# Patient Record
Sex: Male | Born: 1935 | Race: White | Hispanic: No | Marital: Married | Smoking: Never smoker
Health system: Southern US, Community
[De-identification: ages and names within clinical notes are randomized; demographics above are authoritative.]

## PROBLEM LIST (undated history)

## (undated) DIAGNOSIS — F0390 Unspecified dementia without behavioral disturbance: Secondary | ICD-10-CM

## (undated) DIAGNOSIS — M19171 Post-traumatic osteoarthritis, right ankle and foot: Secondary | ICD-10-CM

## (undated) DIAGNOSIS — F329 Major depressive disorder, single episode, unspecified: Secondary | ICD-10-CM

## (undated) DIAGNOSIS — E785 Hyperlipidemia, unspecified: Secondary | ICD-10-CM

## (undated) DIAGNOSIS — G40219 Localization-related (focal) (partial) symptomatic epilepsy and epileptic syndromes with complex partial seizures, intractable, without status epilepticus: Secondary | ICD-10-CM

## (undated) DIAGNOSIS — E039 Hypothyroidism, unspecified: Secondary | ICD-10-CM

## (undated) DIAGNOSIS — K2270 Barrett's esophagus without dysplasia: Secondary | ICD-10-CM

## (undated) DIAGNOSIS — R42 Dizziness and giddiness: Secondary | ICD-10-CM

## (undated) DIAGNOSIS — R413 Other amnesia: Secondary | ICD-10-CM

## (undated) DIAGNOSIS — J449 Chronic obstructive pulmonary disease, unspecified: Secondary | ICD-10-CM

## (undated) DIAGNOSIS — J309 Allergic rhinitis, unspecified: Secondary | ICD-10-CM

## (undated) DIAGNOSIS — G40909 Epilepsy, unspecified, not intractable, without status epilepticus: Secondary | ICD-10-CM

## (undated) DIAGNOSIS — N400 Enlarged prostate without lower urinary tract symptoms: Secondary | ICD-10-CM

## (undated) DIAGNOSIS — K219 Gastro-esophageal reflux disease without esophagitis: Secondary | ICD-10-CM

## (undated) DIAGNOSIS — I471 Supraventricular tachycardia: Secondary | ICD-10-CM

## (undated) DIAGNOSIS — R569 Unspecified convulsions: Secondary | ICD-10-CM

## (undated) DIAGNOSIS — J45909 Unspecified asthma, uncomplicated: Secondary | ICD-10-CM

## (undated) DIAGNOSIS — J209 Acute bronchitis, unspecified: Secondary | ICD-10-CM

## (undated) DIAGNOSIS — M25571 Pain in right ankle and joints of right foot: Secondary | ICD-10-CM

## (undated) DIAGNOSIS — F3289 Other specified depressive episodes: Secondary | ICD-10-CM

## (undated) HISTORY — DX: Hypothyroidism, unspecified: E03.9

## (undated) HISTORY — DX: Pain in right ankle and joints of right foot: M25.571

## (undated) HISTORY — DX: Barrett's esophagus without dysplasia: K22.70

## (undated) HISTORY — DX: Allergic rhinitis, unspecified: J30.9

## (undated) HISTORY — DX: Supraventricular tachycardia: I47.1

## (undated) HISTORY — DX: Major depressive disorder, single episode, unspecified: F32.9

## (undated) HISTORY — DX: Unspecified convulsions: R56.9

## (undated) HISTORY — DX: Dizziness and giddiness: R42

## (undated) HISTORY — DX: Unspecified asthma, uncomplicated: J45.909

## (undated) HISTORY — DX: Other amnesia: R41.3

## (undated) HISTORY — DX: Localization-related (focal) (partial) symptomatic epilepsy and epileptic syndromes with complex partial seizures, intractable, without status epilepticus: G40.219

## (undated) HISTORY — DX: Unspecified dementia, unspecified severity, without behavioral disturbance, psychotic disturbance, mood disturbance, and anxiety: F03.90

## (undated) HISTORY — DX: Gastro-esophageal reflux disease without esophagitis: K21.9

## (undated) HISTORY — DX: Acute bronchitis, unspecified: J20.9

## (undated) HISTORY — DX: Hyperlipidemia, unspecified: E78.5

## (undated) HISTORY — DX: Epilepsy, unspecified, not intractable, without status epilepticus: G40.909

## (undated) HISTORY — DX: Benign prostatic hyperplasia without lower urinary tract symptoms: N40.0

## (undated) HISTORY — DX: Other specified depressive episodes: F32.89

## (undated) HISTORY — DX: Chronic obstructive pulmonary disease, unspecified: J44.9

## (undated) HISTORY — PX: APPENDECTOMY: SHX54

## (undated) HISTORY — DX: Post-traumatic osteoarthritis, right ankle and foot: M19.171

## (undated) NOTE — *Deleted (*Deleted)
We will continue donepezil, memantine and levetiracetam as prescribed.   Stay well hydrated. Well balanced diet and regular exercise encouraged.   Follow up in   Memory Compensation Strategies  1. Use "WARM" strategy.  W= write it down  A= associate it  R= repeat it  M= make a mental note  2.   You can keep a Glass blower/designer.  Use a 3-ring notebook with sections for the following: calendar, important names and phone numbers,  medications, doctors' names/phone numbers, lists/reminders, and a section to journal what you did  each day.   3.    Use a calendar to write appointments down.  4.    Write yourself a schedule for the day.  This can be placed on the calendar or in a separate section of the Memory Notebook.  Keeping a  regular schedule can help memory.  5.    Use medication organizer with sections for each day or morning/evening pills.  You may need help loading it  6.    Keep a basket, or pegboard by the door.  Place items that you need to take out with you in the basket or on the pegboard.  You may also want to  include a message board for reminders.  7.    Use sticky notes.  Place sticky notes with reminders in a place where the task is performed.  For example: " turn off the  stove" placed by the stove, "lock the door" placed on the door at eye level, " take your medications" on  the bathroom mirror or by the place where you normally take your medications.  8.    Use alarms/timers.  Use while cooking to remind yourself to check on food or as a reminder to take your medicine, or as a  reminder to make a call, or as a reminder to perform another task, etc.   Seizure, Adult A seizure is a sudden burst of abnormal electrical activity in the brain. Seizures usually last from 30 seconds to 2 minutes. They can cause many different symptoms. Usually, seizures are not harmful unless they last a long time. What are the causes? Common causes of this condition include:  Fever  or infection.  Conditions that affect the brain, such as: ? A brain abnormality that you were born with. ? A brain or head injury. ? Bleeding in the brain. ? A tumor. ? Stroke. ? Brain disorders such as autism or cerebral palsy.  Low blood sugar.  Conditions that are passed from parent to child (are inherited).  Problems with substances, such as: ? Having a reaction to a drug or a medicine. ? Suddenly stopping the use of a substance (withdrawal). In some cases, the cause may not be known. A person who has repeated seizures over time without a clear cause has a condition called epilepsy. What increases the risk? You are more likely to get this condition if you have:  A family history of epilepsy.  Had a seizure in the past.  A brain disorder.  A history of head injury, lack of oxygen at birth, or strokes. What are the signs or symptoms? There are many types of seizures. The symptoms vary depending on the type of seizure you have. Examples of symptoms during a seizure include:  Shaking (convulsions).  Stiffness in the body.  Passing out (losing consciousness).  Head nodding.  Staring.  Not responding to sound or touch.  Loss of bladder control and bowel control. Some people  have symptoms right before and right after a seizure happens. Symptoms before a seizure may include:  Fear.  Worry (anxiety).  Feeling like you may vomit (nauseous).  Feeling like the room is spinning (vertigo).  Feeling like you saw or heard something before (dj vu).  Odd tastes or smells.  Changes in how you see. You may see flashing lights or spots. Symptoms after a seizure happens can include:  Confusion.  Sleepiness.  Headache.  Weakness on one side of the body. How is this treated? Most seizures will stop on their own in under 5 minutes. In these cases, no treatment is needed. Seizures that last longer than 5 minutes will usually need treatment. Treatment can include:   Medicines given through an IV tube.  Avoiding things that are known to cause your seizures. These can include medicines that you take for another condition.  Medicines to treat epilepsy.  Surgery to stop the seizures. This may be needed if medicines do not help. Follow these instructions at home: Medicines  Take over-the-counter and prescription medicines only as told by your doctor.  Do not eat or drink anything that may keep your medicine from working, such as alcohol. Activity  Do not do any activities that would be dangerous if you had another seizure, like driving or swimming. Wait until your doctor says it is safe for you to do them.  If you live in the [place] ask your local DMV (department of motor vehicles) when you can drive.  Get plenty of rest. Teaching others Teach friends and family what to do when you have a seizure. They should:  Lay you on the ground.  Protect your head and body.  Loosen any tight clothing around your neck.  Turn you on your side.  Not hold you down.  Not put anything into your mouth.  Know whether or not you need emergency care.  Stay with you until you are better.  General instructions  Contact your doctor each time you have a seizure.  Avoid anything that gives you seizures.  Keep a seizure diary. Write down: ? What you think caused each seizure. ? What you remember about each seizure.  Keep all follow-up visits as told by your doctor. This is important. Contact a doctor if:  You have another seizure.  You have seizures more often.  There is any change in what happens during your seizures.  You keep having seizures with treatment.  You have symptoms of being sick or having an infection. Get help right away if:  You have a seizure that: ? Lasts longer than 5 minutes. ? Is different than seizures you had before. ? Makes it harder to breathe. ? Happens after you hurt your head.  You have any of these symptoms after a  seizure: ? Not being able to speak. ? Not being able to use a part of your body. ? Confusion. ? A bad headache.  You have two or more seizures in a row.  You do not wake up right after a seizure.  You get hurt during a seizure. These symptoms may be an emergency. Do not wait to see if the symptoms will go away. Get medical help right away. Call your local emergency services (911 in the [place] Do not drive yourself to the hospital. Summary  Seizures usually last from 30 seconds to 2 minutes. Usually, they are not harmful unless they last a long time.  Do not eat or drink anything that may keep  your medicine from working, such as alcohol.  Teach friends and family what to do when you have a seizure.  Contact your doctor each time you have a seizure. This information is not intended to replace advice given to you by your health care provider. Make sure you discuss any questions you have with your health care provider. Document Revised: 12/25/2018 Document Reviewed: 12/25/2018 Elsevier Patient Education  2020 [employer].

---

## 2002-06-10 ENCOUNTER — Ambulatory Visit (HOSPITAL_COMMUNITY): Admission: RE | Admit: 2002-06-10 | Discharge: 2002-06-10 | Payer: Self-pay | Admitting: *Deleted

## 2002-06-10 ENCOUNTER — Encounter (INDEPENDENT_AMBULATORY_CARE_PROVIDER_SITE_OTHER): Payer: Self-pay | Admitting: Specialist

## 2002-09-27 ENCOUNTER — Ambulatory Visit (HOSPITAL_COMMUNITY): Admission: RE | Admit: 2002-09-27 | Discharge: 2002-09-27 | Payer: Self-pay | Admitting: *Deleted

## 2002-09-27 ENCOUNTER — Encounter (INDEPENDENT_AMBULATORY_CARE_PROVIDER_SITE_OTHER): Payer: Self-pay

## 2003-04-19 ENCOUNTER — Ambulatory Visit (HOSPITAL_COMMUNITY): Admission: RE | Admit: 2003-04-19 | Discharge: 2003-04-19 | Payer: Self-pay | Admitting: *Deleted

## 2003-04-19 ENCOUNTER — Encounter (INDEPENDENT_AMBULATORY_CARE_PROVIDER_SITE_OTHER): Payer: Self-pay | Admitting: *Deleted

## 2003-10-22 HISTORY — PX: OTHER SURGICAL HISTORY: SHX169

## 2004-05-28 ENCOUNTER — Ambulatory Visit (HOSPITAL_COMMUNITY): Admission: RE | Admit: 2004-05-28 | Discharge: 2004-05-28 | Payer: Self-pay | Admitting: *Deleted

## 2004-05-28 ENCOUNTER — Encounter (INDEPENDENT_AMBULATORY_CARE_PROVIDER_SITE_OTHER): Payer: Self-pay | Admitting: *Deleted

## 2004-11-23 ENCOUNTER — Ambulatory Visit: Payer: Self-pay | Admitting: Internal Medicine

## 2004-12-11 ENCOUNTER — Ambulatory Visit (HOSPITAL_COMMUNITY): Admission: RE | Admit: 2004-12-11 | Discharge: 2004-12-11 | Payer: Self-pay | Admitting: *Deleted

## 2004-12-11 ENCOUNTER — Encounter (INDEPENDENT_AMBULATORY_CARE_PROVIDER_SITE_OTHER): Payer: Self-pay | Admitting: *Deleted

## 2005-04-10 ENCOUNTER — Ambulatory Visit: Payer: Self-pay | Admitting: Internal Medicine

## 2005-05-16 ENCOUNTER — Ambulatory Visit: Payer: Self-pay | Admitting: Internal Medicine

## 2005-06-05 ENCOUNTER — Encounter: Admission: RE | Admit: 2005-06-05 | Discharge: 2005-06-05 | Payer: Self-pay | Admitting: Internal Medicine

## 2005-10-16 ENCOUNTER — Ambulatory Visit: Payer: Self-pay | Admitting: Internal Medicine

## 2005-12-23 ENCOUNTER — Encounter (INDEPENDENT_AMBULATORY_CARE_PROVIDER_SITE_OTHER): Payer: Self-pay | Admitting: *Deleted

## 2005-12-23 ENCOUNTER — Ambulatory Visit (HOSPITAL_COMMUNITY): Admission: RE | Admit: 2005-12-23 | Discharge: 2005-12-23 | Payer: Self-pay | Admitting: *Deleted

## 2006-04-24 ENCOUNTER — Encounter: Admission: RE | Admit: 2006-04-24 | Discharge: 2006-04-24 | Payer: Self-pay | Admitting: Orthopedic Surgery

## 2006-06-24 ENCOUNTER — Encounter (INDEPENDENT_AMBULATORY_CARE_PROVIDER_SITE_OTHER): Payer: Self-pay | Admitting: *Deleted

## 2006-06-24 ENCOUNTER — Inpatient Hospital Stay (HOSPITAL_COMMUNITY): Admission: RE | Admit: 2006-06-24 | Discharge: 2006-06-27 | Payer: Self-pay | Admitting: Orthopedic Surgery

## 2006-08-26 ENCOUNTER — Ambulatory Visit: Payer: Self-pay | Admitting: Internal Medicine

## 2006-12-22 ENCOUNTER — Ambulatory Visit: Payer: Self-pay | Admitting: Internal Medicine

## 2007-02-26 ENCOUNTER — Encounter (INDEPENDENT_AMBULATORY_CARE_PROVIDER_SITE_OTHER): Payer: Self-pay | Admitting: Specialist

## 2007-02-26 ENCOUNTER — Ambulatory Visit (HOSPITAL_COMMUNITY): Admission: RE | Admit: 2007-02-26 | Discharge: 2007-02-26 | Payer: Self-pay | Admitting: *Deleted

## 2007-06-30 ENCOUNTER — Ambulatory Visit: Payer: Self-pay | Admitting: Psychology

## 2007-06-30 ENCOUNTER — Encounter: Admission: RE | Admit: 2007-06-30 | Discharge: 2007-09-28 | Payer: Self-pay | Admitting: Neurology

## 2007-09-26 DIAGNOSIS — K219 Gastro-esophageal reflux disease without esophagitis: Secondary | ICD-10-CM | POA: Insufficient documentation

## 2007-09-26 DIAGNOSIS — J309 Allergic rhinitis, unspecified: Secondary | ICD-10-CM

## 2007-09-26 DIAGNOSIS — R569 Unspecified convulsions: Secondary | ICD-10-CM

## 2007-09-26 DIAGNOSIS — J45909 Unspecified asthma, uncomplicated: Secondary | ICD-10-CM

## 2007-09-26 HISTORY — DX: Allergic rhinitis, unspecified: J30.9

## 2007-09-26 HISTORY — DX: Unspecified convulsions: R56.9

## 2007-09-26 HISTORY — DX: Unspecified asthma, uncomplicated: J45.909

## 2008-03-08 ENCOUNTER — Telehealth (INDEPENDENT_AMBULATORY_CARE_PROVIDER_SITE_OTHER): Payer: Self-pay | Admitting: *Deleted

## 2008-03-11 ENCOUNTER — Ambulatory Visit: Payer: Self-pay | Admitting: Internal Medicine

## 2008-05-17 ENCOUNTER — Ambulatory Visit (HOSPITAL_COMMUNITY): Admission: RE | Admit: 2008-05-17 | Discharge: 2008-05-17 | Payer: Self-pay | Admitting: *Deleted

## 2008-05-17 ENCOUNTER — Encounter (INDEPENDENT_AMBULATORY_CARE_PROVIDER_SITE_OTHER): Payer: Self-pay | Admitting: *Deleted

## 2008-07-11 ENCOUNTER — Telehealth (INDEPENDENT_AMBULATORY_CARE_PROVIDER_SITE_OTHER): Payer: Self-pay | Admitting: *Deleted

## 2008-12-12 ENCOUNTER — Encounter (INDEPENDENT_AMBULATORY_CARE_PROVIDER_SITE_OTHER): Payer: Self-pay | Admitting: *Deleted

## 2008-12-12 ENCOUNTER — Ambulatory Visit (HOSPITAL_COMMUNITY): Admission: RE | Admit: 2008-12-12 | Discharge: 2008-12-12 | Payer: Self-pay | Admitting: *Deleted

## 2009-05-12 ENCOUNTER — Telehealth: Payer: Self-pay | Admitting: Internal Medicine

## 2009-09-05 ENCOUNTER — Ambulatory Visit: Payer: Self-pay | Admitting: Internal Medicine

## 2009-09-05 DIAGNOSIS — J209 Acute bronchitis, unspecified: Secondary | ICD-10-CM

## 2009-09-05 HISTORY — DX: Acute bronchitis, unspecified: J20.9

## 2009-09-06 ENCOUNTER — Telehealth: Payer: Self-pay | Admitting: Internal Medicine

## 2010-01-09 ENCOUNTER — Encounter: Admission: RE | Admit: 2010-01-09 | Discharge: 2010-01-09 | Payer: Self-pay | Admitting: Neurology

## 2010-06-04 ENCOUNTER — Emergency Department (HOSPITAL_COMMUNITY): Admission: EM | Admit: 2010-06-04 | Discharge: 2010-06-04 | Payer: Self-pay | Admitting: Emergency Medicine

## 2010-10-08 ENCOUNTER — Ambulatory Visit: Payer: Self-pay | Admitting: Internal Medicine

## 2010-11-22 NOTE — Assessment & Plan Note (Signed)
Summary: 1 YR ///KP   Visit Type:  Follow-up Primary Provider/Referring Provider:  Eloise Harman  CC:  1 year follow up. pt has no new concerns. .  History of Present Illness:  History of Present Illness: 03/26/2008- Never smoker returning for f/u of allergic rhinitis and bronchitis. Occasional spring pollen rhinitis. Breathing does not wake him.reflux comes.  He takes Nexium, but is changing to omeprazole b.i.d. under supervision of Dr. Virginia Rochester. he did quit allergy vaccine 3 or 4 years ago.  We reviewed his medications.  September 05, 2009- Allergic rhinitis, bronchitis, GERD, hx seizure..........................Marland Kitchenwife here Coughing up dark sputum 3-4 days, esp lying down. Denies headache, fever or sore throat. No GI. Taking Robitusin. Did get flu vax. Has lost from 165 a year ago- trying to eat healthier.  October 08, 2010- Allergic rhinitis, bronchitis, GERD, hx seizure.. Nurse-CC: 1 year follow up. pt has no new concerns.  Got flu shot. Feels he is doing well. Likes Advair which works well for him.  CXR last year WNL, NAD, incidental bone shadow..     Asthma History    Initial Asthma Severity Rating:    Age range: 12+ years    Symptoms: 0-2 days/week    Nighttime Awakenings: 0-2/month    Interferes w/ normal activity: no limitations    SABA use (not for EIB): 0-2 days/week    Asthma Severity Assessment: Intermittent   Preventive Screening-Counseling & Management  Alcohol-Tobacco     Smoking Status: never  Current Medications (verified): 1)  Advair Diskus 250-50 Mcg/dose Misc (Fluticasone-Salmeterol) .... One Puff Every 12 Hours, Rinse Mouth 2)  Keppra 500 Mg  Tabs (Levetiracetam) .... Take 2 By Mouth Once Daily 3)  Vitamin B-6 500 Mg  Tabs (Pyridoxine Hcl) 4)  Synthroid 125 Mcg Tabs (Levothyroxine Sodium) .... Take 1 By Mouth Once Daily 5)  Proair Hfa 108 (90 Base) Mcg/act  Aers (Albuterol Sulfate) .... 2 Puffs Four Times A Day As Needed - Must Make Appt With Dr. Maple Hudson For Further  Refills 6)  Omeprazole 20 Mg Cpdr (Omeprazole) .... Take 1 By Mouth Once Daily 7)  Avodart 0.5 Mg Caps (Dutasteride) .... Take 1 By Mouth Once Daily  Allergies (verified): 1)  ! Sulfa  Past History:  Past Medical History: Last updated: March 26, 2008  SEIZURE DISORDER (ICD-780.39) ESOPHAGEAL REFLUX (ICD-530.81) ALLERGIC RHINITIS (ICD-477.9) ASTHMA (ICD-493.90)  Past Surgical History: Last updated: 09/05/2009 Appendectomy Accident- hit in chest with rib fxs and fx left arm, knocked out teeth while in [employer] Right hip replacement  Family History: Last updated: 2008/03/26 Mother- deceased age 59; heart. Father- deceased age 80; heart. Sister- living age 24; asthma.  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   Risk Factors: Smoking Status: never (10/08/2010)  Review of Systems      See HPI  The patient denies shortness of breath with activity, shortness of breath at rest, productive cough, non-productive cough, coughing up blood, chest pain, irregular heartbeats, acid heartburn, indigestion, loss of appetite, weight change, abdominal pain, difficulty swallowing, sore throat, tooth/dental problems, headaches, nasal congestion/difficulty breathing through nose, sneezing, itching, ear ache, rash, change in color of mucus, and fever.    Vital Signs:  Patient profile:   77 year old male Height:      68.5 inches Weight:      158 pounds BMI:     23.76 O2 Sat:      96 % on Room air Pulse rate:   65 / minute BP  sitting:   104 / 60  (left arm) Cuff size:   regular  Vitals Entered By: Carver Fila (October 08, 2010 2:30 PM)  O2 Flow:  Room air CC: 1 year follow up. pt has no new concerns.  Comments meds and allergies updated Phone number updated Carver Fila  October 08, 2010 2:31 PM    Physical Exam  Additional Exam:  General: A/Ox3; pleasant and cooperative, NAD, slim, looks verycomfortable NODES: no  lymphadenopathy HEENT: Horace/AT, EOM- WNL, Conjuctivae- clear, PERRLA, TM-WNL, Nose- clear, Throat- clear and wnl, Mallampatii II NECK: Supple w/ fair ROM, JVD- none, normal carotid impulses w/o bruits Thyroid-  CHEST: very clear and unlabored HEART: RRR, no m/g/r heard ABDOMEN: trim JYN:WGNF, nl pulses, no edema  NEURO: Grossly intact to observation      CXR  Procedure date:  08/30/2009  Findings:      DG CHEST 2 VIEW - [id number]   Clinical Data: Nonsmoker with asthma and cough and congestion.   CHEST - 2 VIEW   Comparison: 06/18/2006.   Findings: The heart size and mediastinal contours are stable.  The lungs are clear and do not appear significantly hyperinflated.  The previously described added density medially at the right apex is unchanged and appears to be related to the right sternoclavicular junction.  No acute osseous findings are demonstrated.   IMPRESSION: Stable examination.  No active cardiopulmonary process.   Read By:  Gerrianne Scale,  M.D.     Released By:  Gerrianne Scale,  M.D.  ___________  Impression & Recommendations:  Problem # 1:  ASTHMA (ICD-493.90) Good control.  He is pleased with Advair and will continue that. We discussed use of rescue inhaler- needed only very rarely.  Problem # 2:  ALLERGIC RHINITIS (ICD-477.9) He is managing with seasonal antihistamines now.  Problem # 3:  ESOPHAGEAL REFLUX (ICD-530.81) He has a hx of seizure disorder and of GERD/ reflux, which increase the risk of aspiration and worsening of asthma. We discussed this as a potential issue. His updated medication list for this problem includes:    Omeprazole 20 Mg Cpdr (Omeprazole) .Marland Kitchen... Take 1 by mouth once daily  Other Orders: Est. Patient Level IV (78469)  Patient Instructions: 1)  Please schedule a follow-up appointment in 1 year. 2)  Refill scripts for meds.  Prescriptions: PROAIR HFA 108 (90 BASE) MCG/ACT  AERS (ALBUTEROL SULFATE) 2 puffs four times a  day as needed - MUST make appt with Dr. Maple Hudson for further refills  #1 x prn   Entered and Authorized by:   Waymon Budge MD   Signed by:   Waymon Budge MD on 10/08/2010   Method used:   Print then Give to Patient   RxID:   [id number] ADVAIR DISKUS 250-50 MCG/DOSE MISC (FLUTICASONE-SALMETEROL) One puff every 12 hours, rinse mouth  #1 x prn   Entered and Authorized by:   Waymon Budge MD   Signed by:   Waymon Budge MD on 10/08/2010   Method used:   Print then Give to Patient   RxID:   [id number]    Immunization History:  Influenza Immunization History:    Influenza:  historical (06/21/2010)    CXR  Procedure date:  08/30/2009  Findings:      DG CHEST 2 VIEW - [id number]   Clinical Data: Nonsmoker with asthma and cough and congestion.   CHEST - 2 VIEW   Comparison: 06/18/2006.   Findings: The heart size and mediastinal contours  are stable.  The lungs are clear and do not appear significantly hyperinflated.  The previously described added density medially at the right apex is unchanged and appears to be related to the right sternoclavicular junction.  No acute osseous findings are demonstrated.   IMPRESSION: Stable examination.  No active cardiopulmonary process.   Read By:  Gerrianne Scale,  M.D.     Released By:  Gerrianne Scale,  M.D.  ___________

## 2011-03-05 NOTE — Op Note (Signed)
NAME:  [name], [name] NO.:  [id number]   MEDICAL RECORD NO.:  [id number]          PATIENT TYPE:  AMB   LOCATION:  ENDO                         FACILITY:  [facility]   PHYSICIAN:  Georgiana Spinner, M.D.    DATE OF BIRTH:  1934-01-24   DATE OF PROCEDURE:  DATE OF DISCHARGE:                               OPERATIVE REPORT   PROCEDURE:  Upper endoscopy.   INDICATIONS:  Barrett's esophagus.   ANESTHESIA:  Fentanyl 50 mcg, Versed 5 mg.   PROCEDURE IN DETAIL:  With the patient mildly sedated in the left  lateral decubitus position, the Pentax videoscopic endoscope was  inserted in the mouth and passed under direct vision through the  esophagus, which appeared normal, until we reached the distal esophagus  and extensive Barrett's esophagus once again seen, photographed and  multiple biopsies taken in all 4 quadrants.  We then entered into the  stomach, fundus, body, antrum, duodenal bulb, second portion of the  duodenum were visualized.  From this point the endoscope was slowly  withdrawn, taking circumferential views of duodenal mucosa until the  endoscope had been pulled back and the stomach placed in retroflexion to  view the stomach from below. The endoscope was straightened and  withdrawn, taking circumferential views of remaining gastric and  esophageal mucosa.  The patient's vital signs, pulse oximeter remained  stable.  The patient tolerated procedure well, without apparent  complication.   FINDINGS:  Extensive Barrett's esophagus, loose wrap of the  gastroesophageal junction around the endoscope indicating laxity of this  area.   PLAN:  Await biopsy report.  The patient will call me for results and  followup with me as needed as an outpatient.           ______________________________  Georgiana Spinner, M.D.     GMO/MEDQ  D:  05/17/2008  T:  05/17/2008  Job:  [id number]

## 2011-03-05 NOTE — Op Note (Signed)
NAME:  [name], MCMAHILL NO.:  [id number]   MEDICAL RECORD NO.:  [id number]          PATIENT TYPE:  AMB   LOCATION:  ENDO                         FACILITY:  [facility]   PHYSICIAN:  Georgiana Spinner, M.D.    DATE OF BIRTH:  07/05/1934   DATE OF PROCEDURE:  DATE OF DISCHARGE:                               OPERATIVE REPORT   PROCEDURE:  Upper endoscopy with biopsy.   INDICATIONS:  GERD with Barrett esophagus.   ANESTHESIA:  Fentanyl 50 mcg, Versed 4 mg.   PROCEDURE:  With the patient mildly sedated in the left lateral  decubitus position, the Pentax videoscopic endoscope was inserted in the  mouth, passed under direct vision through the esophagus, which appeared  normal, until we reached the distal esophagus, and there were clear-cut  changes of Barrett esophagus, photographed and biopsied.  We entered  into the stomach.  Fundus, body, antrum, duodenal bulb, second portion  of the duodenum were visualized.  From this point, the endoscope was  slowly withdrawn, taking circumferential views of the duodenal mucosa  until the endoscope had been pulled back into stomach, placed in  retroflexion to view the stomach from below.  The endoscope was  straightened and withdrawn, taking circumferential views of the  remaining gastric and esophageal mucosa.  The patient's vital signs and  pulse oximeter remained stable.  The patient tolerated the procedure  well without apparent complications.   FINDINGS:  Loose wrap of the GE junction around the endoscope indicating  laxity of the lower esophageal sphincter and Barrett esophagus.  Await  biopsy report.  The patient will call me for results and follow up with  me as an outpatient.           ______________________________  Georgiana Spinner, M.D.     GMO/MEDQ  D:  12/12/2008  T:  12/12/2008  Job:  [id number]

## 2011-03-08 NOTE — Op Note (Signed)
   TNAMERAM, HAUGAN NO.:  [id number]   MEDICAL RECORD NO.:  [id number]                   PATIENT TYPE:  AMB   LOCATION:  ENDO                                 FACILITY:  [facility]   PHYSICIAN:  Georgiana Spinner, M.D.                 DATE OF BIRTH:  05-18-1934   DATE OF PROCEDURE:  DATE OF DISCHARGE:                                 OPERATIVE REPORT   PROCEDURE:  Upper endoscopy.   INDICATIONS FOR PROCEDURE:  Gastroesophageal reflux disease.   ANESTHESIA:  Demerol 50, Versed 5 mg.   DESCRIPTION OF PROCEDURE:  With the patient mildly sedated in the left  lateral decubitus position, the Olympus videoscopic endoscope was inserted  in the mouth and passed under direct vision through the esophagus which was  normal until about mid esophagus and extensive changes of Barrett's  esophagus were seen, photographed and biopsied. There was a hiatal hernia.  The fundus, body, antrum, duodenal bulb and second portion of the duodenum  and all appeared normal, representative photographs taken. From this point,  the endoscope was slowly withdrawn taking circumferential views of the  duodenal mucosa until the endoscope was then pulled back in the stomach,  placed in retroflexion to view the stomach from below and once again a  hiatal hernia was visualized. The endoscope was then straightened and  withdrawn taking circumferential views of the remaining gastric and  esophageal mucosa. The patient's vital signs and pulse oximeter remained  stable. The patient tolerated the procedure well without apparent  complications.   FINDINGS:  Diffuse Barrett's esophagus with a hiatal hernia.   PLAN:  Await biopsy reports. The patient will call me for results and  followup with me as an outpatient. Proceed to colonoscopy as planned.                                                Georgiana Spinner, M.D.    GMO/MEDQ  D:  06/10/2002  T:  06/11/2002  Job:  [id number]

## 2011-03-08 NOTE — Assessment & Plan Note (Signed)
[facility]                             PULMONARY OFFICE NOTE   NAME:[name], [name]                   MRN:          [id number]  DATE:12/22/2006                            DOB:          Jul 24, 1934    HISTORY OF PRESENT ILLNESS:  The patient is a patient of Dr. Roxy Cedar who  has a known history of asthma, allergic rhinitis who presents for a one  week history of nasal congestion, productive cough with thick yellowish  sputum, fevers, chills and hoarseness. The patient's wife has had  similar symptoms at home. The patient denies any hemoptysis or  orthopnea, PND or leg swelling.   PAST MEDICAL HISTORY:  Reviewed.   CURRENT MEDICATIONS:  Reviewed.   PHYSICAL EXAMINATION:  The patient is pleasant male in no acute  distress. He is afebrile with stable vital signs. O2 saturation is 94%  on room air.  HEENT: Nasal mucosa with some mild erythema. Nontender sinuses.  NECK: Supple without adenopathy. No JVD.  LUNGS: Lung sounds reveal coarse breath sounds without any wheezing or  crackles.  CARDIAC: Regular rate.  ABDOMEN: Soft and nontender.  EXTREMITIES: Warm without edema.   IMPRESSION/PLAN:  Acute upper respiratory infection with mild asthmatic  flare. The patient is to begin Omnicef x 7 days, Mucinex DM twice daily  and prednisone taper over the next week. The patient was given a Xopenex  nebulizer treatment in the office. The patient will followup with Dr.  Maple Hudson as scheduled or sooner if needed.      Rubye Oaks, NP  Electronically Signed      Clinton D. Maple Hudson, MD, Tonny Bollman, FACP  Electronically Signed   TP/MedQ  DD: 12/22/2006  DT: 12/22/2006  Job #: [id number]

## 2011-03-08 NOTE — Op Note (Signed)
NAME:  [name], [name] NO.:  [id number]   MEDICAL RECORD NO.:  [id number]          PATIENT TYPE:  AMB   LOCATION:  ENDO                         FACILITY:  [facility]   PHYSICIAN:  Georgiana Spinner, M.D.    DATE OF BIRTH:  1934-03-25   DATE OF PROCEDURE:  02/26/2007  DATE OF DISCHARGE:                               OPERATIVE REPORT   PROCEDURE:  Upper endoscopy with biopsy.   INDICATIONS:  Barrett esophagus.   ANESTHESIA:  Fentanyl 100 mcg, Versed 10 mg.   PROCEDURE:  With patient mildly sedated in the left lateral decubitus  position, the Pentax videoscopic endoscope was inserted in the mouth,  passed under direct vision through the esophagus, which appeared normal  until we reached the distal esophagus and extensive Barrett esophagus  was photographed and multiple biopsies taken.  We entered into the  stomach.  The fundus, body, antrum, duodenal bulb, second portion of the  duodenum appeared normal.  From this point, the endoscope was slowly  withdrawn taking circumferential views of the duodenal mucosa until the  endoscope had been pulled back into the stomach, placed in retroflexion  and viewed the stomach from below.  The endoscope was straightened and  withdrawn, taking circumferential views of the remaining gastric and  esophageal mucosa.  The patient's vital signs and pulse oximeter  remained stable.  The patient tolerated the procedure well without  apparent complications.   FINDINGS:  Extensive Barrett esophagus, biopsied.  Await biopsy report.  The patient will call me for results and follow up with me as an  outpatient.           ______________________________  Georgiana Spinner, M.D.     GMO/MEDQ  D:  02/26/2007  T:  02/26/2007  Job:  [id number]

## 2011-03-08 NOTE — Discharge Summary (Signed)
NAMEALLEN, Stephen NO.:  [id number]   MEDICAL RECORD NO.:  [id number]          PATIENT TYPE:  INP   LOCATION:  1512                         FACILITY:  [facility]   PHYSICIAN:  Madlyn Frankel. Charlann Boxer, M.D.  DATE OF BIRTH:  Jul 20, 1934   DATE OF ADMISSION:  06/24/2006  DATE OF DISCHARGE:  06/27/2006                                 DISCHARGE SUMMARY   PREOPERATIVE DIAGNOSIS:  Right hip advanced degenerative joint disease.   SECONDARY DIAGNOSES:  1. History of seizure disorder.  2. Asthma.  3. Hypothyroidism.  4. Hypercholesterolemia.  5. Acid reflux disease.   DISCHARGE MEDICATIONS:  Discharge medications include that seen on the home  med reconciliation form including Advair, Lamictal, Zetia, finasteride,  Nexium, albuterol, lovastatin, Synthroid, multivitamin, chondroitin sulfate,  and glucosamine and Lamictal. He will also be discharged on Colace as needed  p.o. b.i.d. for constipation on pain medication. He will be discharged on  Lovenox for 10 days to be stopped on July 07, 2006 and transitioned to  1 aspirin 325 mg daily for 4 weeks. He will be discharged on Vicodin for  pain and Robaxin for pain. He will also be discharged on an iron supplement.   HISTORY OF PRESENT ILLNESS:  Mr. [name] is a very pleasant 77 year old  gentleman with right hip pain. He was initially seen in June of this year  with right hip arthritis. He had progressive symptoms and requested discuss  hip replacement surgery. This was put on __________.   He was admitted for surgery on June 24, 2006 and underwent a successful  right total hip replacement without complications. His hospital course was  uncomplicated, he had no complications regarding his seizure activity or  asthma. His hematocrit did stabilize at 8 on iron and did not require  transfusion. This was despite having a pretty reactive synovitis in his hip  intraoperatively. He progressed with physical therapy and at the time  of  discharge was up moving around still having difficulty getting in and out of  bed. He had normal bowel function and was eating a normal diet at the time  of discharge.   DISCHARGE INSTRUCTIONS:  He will be partial weight bearing, 50%  weightbearing to the right lower extremity with the use of a walker, hip  precautions should be minded in terms of hip flexion greater than 90  degrees, internal rotation and pivoting. I will see him back in the office  in 2 weeks post discharge. He may shower and get his wound wet with Steri-  Strips over the wound. A dressing should be applied as needed.      Madlyn Frankel Charlann Boxer, M.D.  Electronically Signed    MDO/MEDQ  D:  06/27/2006  T:  06/27/2006  Job:  [id number]

## 2011-03-08 NOTE — Op Note (Signed)
   NAME:  [name], [name]                      ACCOUNT NO.:  [id number]   MEDICAL RECORD NO.:  [id number]                   PATIENT TYPE:  AMB   LOCATION:  ENDO                                 FACILITY:  [facility], [place]   PHYSICIAN:  Georgiana Spinner, M.D.                 DATE OF BIRTH:  October 14, 1934   DATE OF PROCEDURE:  09/27/2002  DATE OF DISCHARGE:                                 OPERATIVE REPORT   PROCEDURE:  Upper endoscopy with biopsy.   INDICATIONS:  GERD with Barrett's esophagus seen previously.   ANESTHESIA:  Demerol 50 mg, Versed 5 mg.   DESCRIPTION OF PROCEDURE:  With the patient mildly sedated in the left  lateral decubitus position, the Olympus videoscopic endoscope was inserted  into the mouth and passed under direct vision through the esophagus which  appeared normal until it reached the distal esophagus.  Fairly florid  Barrett's esophagus was seen and photographed in multiple views.  We entered  into the stomach and the fundus, body, antrum, duodenal bulb, and second  portion of the duodenum were visualized and all appeared normal.  From this  point, the endoscope was slowly withdrawn, taking circumferential views of  the duodenal mucosa until the endoscope was pulled into the stomach and  placed in retroflexion, viewing the stomach from below and an incomplete  wrap at the GE junction around the endoscope was well seen and the esophagus  could be easily seen on retroflex view in the cardia with photographs taken.  The endoscope was  then straightened and withdrawn, taking circumferential  views of the remaining gastric and esophageal mucosa, stopping at the distal  esophagus to sample all four quadrants and seeing Barrett's esophagus.  Once  accomplished, the endoscope was withdrawn.  The patient's vital signs and  pulse oximetry remained stable and the patient tolerated the procedure well  without apparent complications.   FINDINGS:  Incomplete wrap at gastroesophageal  junction around the  endoscope, indicating laxity of the sphincter associated with fairly florid  Barrett's esophagus.   PLAN:  Await biopsy report, the patient will call me for results and follow  up with me as an outpatient.                                               Georgiana Spinner, M.D.    GMO/MEDQ  D:  09/27/2002  T:  09/27/2002  Job:  [id number]

## 2011-03-08 NOTE — Op Note (Signed)
   NAME:  [name], [name]                      ACCOUNT NO.:  [id number]   MEDICAL RECORD NO.:  [id number]                   PATIENT TYPE:  AMB   LOCATION:  ENDO                                 FACILITY:  [facility]   PHYSICIAN:  Georgiana Spinner, M.D.                 DATE OF BIRTH:  October 20, 1934   DATE OF PROCEDURE:  04/19/2003  DATE OF DISCHARGE:                                 OPERATIVE REPORT   PROCEDURE:  Upper endoscopy with biopsy.   INDICATIONS:  Barrett's esophagus with low-grade dysplasia.   ANESTHESIA:  Demerol 30 mg, Versed 4 mg.   DESCRIPTION OF PROCEDURE:  With the patient mildly sedated in the left  lateral decubitus position, the Olympus videoscopic endoscope was inserted  into the mouth and passed under direct vision through the esophagus; which  appeared normal until we reached the distal esophagus, and clear areas of  Barrett's were seen.  It was circumferential; photographed and biopsied in  multiple areas with sampling taken every few centimeters.  Once  accomplished, we entered into the stomach.  The fundus, body, antrum,  duodenal bulb, second portion of the duodenum all appeared normal.  From  this point the endoscope was slowly withdrawn, taking circumferential views  of the entire duodenal mucosa, until the endoscope then pulled back and the  stomach placed in retroflexion to view the stomach from below.  A widely  patent GE junction was seen and photographed in retroflex view.  The  endoscope was straightened and withdrawn, taking circumferential views of  the remaining gastric and esophageal mucosa.  The patient's vital signs and  pulse oximetry remained stable.  The patient tolerated the procedure well  and all without apparent complications.   FINDINGS:  Hiatal hernia, above which was Barrett's esophagus, fairly  extensive.  This was biopsied.   PLAN:  Await biopsy report.  The patient will call me for results and follow  up with me as an outpatient.                                               Georgiana Spinner, M.D.    GMO/MEDQ  D:  04/19/2003  T:  04/19/2003  Job:  [id number]

## 2011-03-08 NOTE — Op Note (Signed)
NAME:  [name], [name] NO.:  [id number]   MEDICAL RECORD NO.:  [id number]          PATIENT TYPE:  INP   LOCATION:  0001                         FACILITY:  [facility]   PHYSICIAN:  Madlyn Frankel. Charlann Boxer, M.D.  DATE OF BIRTH:  [birth year]   DATE OF PROCEDURE:  06/24/2006  DATE OF DISCHARGE:                                 OPERATIVE REPORT   PREOPERATIVE DIAGNOSIS:  Advanced right hip osteoarthritis.   POSTOPERATIVE DIAGNOSES:  Advanced right hip osteoarthritis associated with  femoral neck fracture.   PROCEDURE:  Right total hip replacement.   COMPONENTS USED:  DePuy pinnacle 52 cup with two cancellous bone screws and  36 metal-on-metal neutral liner.  The femoral stem was a Trilock 13.8  lateralized offset stem with a 36, +5 ball.   SURGEON:  Madlyn Frankel. Charlann Boxer, M.D.   ASSISTANT:  Cherly Beach.   ANESTHESIA:  General.   BLOOD LOSS:  600 mL.   DRAINS:  None.   COMPLICATIONS:  None.   INDICATIONS:   INDICATIONS FOR PROCEDURE:  Mr. Salamone is a very pleasant 77 year old  gentleman who presented the office in June.  He had initially presented to  his primary care physician with right hip pain.  Radiographs that were  obtained in June,2007 at the time of presentation revealed that he had some  degenerative joint  disease.  I discussed with him treatment options and  included conservative care of use of a cane or walker, medications and  injections.  We did try cortisone injection to try to alleviate some of  symptoms.  This did very little to help his pain.  His pain continued to  press.  He was  resulted to be in a walker.  No new films have been ordered  based on the fact that he was already scheduled for surgery based on the  failure of conservative measures.   We discussed with him the surgical options of total hip replacement,  reviewing the risks and benefits of component failure, dislocation, need for  revision surgery, and persistent pain.  Consent was  obtained.   PROCEDURE IN DETAIL:  The patient was brought to operative theater.  Once  adequate anesthesia and preoperative antibiotics, 1 gram of Ancef, were  administered the patient was positioned in the left lateral decubitus  position with a right side up.  Right lower extremity was then prepped and  draped in sterile fashion following pre scrub.  Lateral based incision was  made for posterior approach of the hip.  The iliotibial band and gluteal  fascia was incised in line with incision for the posterior approach.  The  short external rotators taken down separate from the posterior capsule.  Capsulotomy was made and there was noted that the patient had a very large  hemarthrosis and it was evacuated.  Inside the joint there appeared to be of  significant synovitis.   This gave concern that perhaps it was either femoral head collapse or other  complication.  Upon attempted dislocation it was evident the femoral neck  was fractured.  This fracture was completed  with the dislocation, but no  other complications.  I used the corkscrew to remove the femoral head and  then made a neck osteotomy based off anatomic landmarks and our preoperative  templating base of the tip of the trochanter.   I did send off the femoral head and neck to pathology to make sure there was  no pathologic lesions even though the anatomy appeared to be normal at this  point.   This coincides with the preoperative finding when positioning noting  significant shortening to the right lower extremity.  I was able to distract  the femur with axial traction to restore the leg lengths.   At this point attention was directed to the femur.  Femoral preparation was  carried out per protocol with initial canal finder then single hand reaming  and then broaching I did irrigate the canal to prevent fat emboli.  I then  broached all way up to initially 12.5 broach and then went to the  acetabulum.  I identified that I had  about 20 degrees of anteversion in the  femur.   At this point, acetabular preparation was carried out through a synovectomy.  There was noted to be a pretty significant synovitis that was oozing.  Hemostasis was carried out as could be.  Retractors were placed.  There was  no significant labrum that needed to be debrided, but the rim was cleaned  up.  I started reaming with a 43 and carried it all the way up to a 51  reamer which had a good bony coverage.  At this point I impacted a 52  pinnacle cup.  Please note that I did initially impact this cup and placed  this screw and then performed a trial reduction with a 12.5 stem.  I was not  happy with my combined anteversion with this initial trial reduction so I  moved the trial components including the final femoral acetabular component  and repositioned it.  The final position the acetabular cup was beneath the  anterior wall anteriorly with about 25 degrees of forward flexion.  It  appeared to be much more anatomic upon the completion of this redo.  Single  cancellous screws were placed in two orientations to provide stability to  the cup, to add to the initial scratch fit.   A trial liner was placed.  At this point attention was redirected back to  the femur.  It was noted there was some torsional mobility with a 12.5 stem,  so I went up to a 13.8.  This was impacted to the level of the neck cut or a  little bit proud of this.   Initially I had trialed with a +5 ball and did this again.  The hip was much  more stable with the combined anteversion of 45 degrees.  The hip was stable  to 70-80 degrees internal rotation with neutral abduction and flexion of 90.  Stable in the sleep position with 4 degrees of adduction and internal  rotation.  No evidence of impingement with external rotation and extension.  Leg lengths at this point appeared to be equal with good muscle tension with about a mm of shuck.   At this point final components  were brought to the field.  The hole  eliminator was placed followed by placement of 32 metal neutral liner.  The  final 13.8 lateralized offset stem was impacted to the level of the neck  cut.  This had a good initial  fit.  Based on this I went ahead and placed a  36, +5 ball.  The head center was at or a little below the tip of the  trochanter which corresponded with his radiograph findings.  The hip was  reduced and found to be stable to trial reduction.  The hip was irrigated  throughout the case.  Given this is a significant synovitis and oozing was  present I did choose to spray some thrombin into the joint.  I closed the  capsule and sprayed the thrombin   I then reapproximated the iliotibial band and gluteal fascia and sprayed it  again.  The remainder of the wound was closed in layers with 2-0 Vicryl and  4-0 Monocryl.  Hip was cleaned, dried and dressed sterilely with Steri-  Strips, dressing and sponge tape.  The patient was brought to recovery,  extubated in stable condition.      Madlyn Frankel Charlann Boxer, M.D.  Electronically Signed     MDO/MEDQ  D:  06/24/2006  T:  06/24/2006  Job:  [id number]

## 2011-03-08 NOTE — H&P (Signed)
NAME:  [name], [name] NO.:  [id number]   MEDICAL RECORD NO.:  [id number]           PATIENT TYPE:   LOCATION:                                 FACILITY:   PHYSICIAN:  Madlyn Frankel. Charlann Boxer, M.D.  DATE OF BIRTH:  [birth year]   DATE OF ADMISSION:  DATE OF DISCHARGE:                                HISTORY & PHYSICAL   ADMITTING HISTORY AND PHYSICAL   PRINCIPLE DIAGNOSIS:  Right hip osteoarthritis.   SECONDARY DIAGNOSES:  1. Hypothyroidism.  2. Asthma.  3. History of seizure disorder.  4. History of hypercholesterolemia.  5. Acid reflux disease.   ADMITTING HISTORY:   HISTORY OF PRESENT ILLNESS:  Mr. Theresa Mulligan is a 77 year old gentleman who was  initially seen and evaluated through Dr. Janice Coffin office for right hip  osteoarthritis and pain.  Radiographs revealed end-stage degenerative  changes.  He had failed conservative measures including hip injection.  We  got him scheduled for surgery as soon as we possibly could, but he has had  persistent discomfort.  We are relying on some Darvocet to help with some  pain control.   He has had no other complicating features, no constitutional symptoms to  report.   PAST MEDICAL HISTORY:  1. Reflux disease.  2. Hypercholesterolemia.  3. Hyperthyroidism.  4. Asthma.  5. Seizure disorder.   PREVIOUS SURGICAL HISTORY:  1. Appendectomy.  2. Tonsillectomy.   FAMILY MEDICAL HISTORY:  Noncontributory.   CURRENT MEDICATIONS:  1. Advair.  2. Lamictal.  3. Darvocet.  4. Zetia.  5. Finasteride.  6. Nexium.  7. Albuterol.  8. Lovastatin.  9. Synthroid.  10.One Source Multivitamin.  11.Glucosamine and chondroitin sulfate.   DRUG ALLERGIES:  SULFA.   SOCIAL HISTORY:  Denies smoking.  He is married.  His wife recently had  problems herself with a stroke and is probably not going to be able to take  of her.   REVIEW OF SYSTEMS:  Otherwise unremarkable other than that noted in the HPI,  but no evidence of upper  respiratory, pulmonary, cardiac, gastrointestinal,  genito/ureteral issues.   Examination in the office today revealed that he had a temperature of 98.1,  pulse 68, blood pressure 127/72.  In general, he was awake, alert, and  oriented, in no acute distress.  He did present with a walker today due to  the pain in his right hip.  He has normal speech.  He has no evidence of any  ocular dysfunction.  No slurred speech.  His neck is supple with no bruits  detected.  Chest is clear to auscultation bilaterally.  His heart has a  regular rate and rhythm.  No murmurs detected.  His abdomen is soft and  nontender.  Examination of the right hip reveals a painful hip range of  motion, particularly with flexion and internal rotation of about 5 degrees.  He has palpable pulses with intact sensibility.  No signs of erythema around  the right hip.   His radiographs revealed end-stage right hip osteoarthritis.  Labs are  pending in his evaluation at [facility].  IMPRESSION:  Right hip osteoarthritis.   PLAN:  The patient will be admitted for same day surgery on July 04, 2006.  Plan will be for right total hip replacement using the metal on metal  components.  This will be primarily used for hip longevity, but also for  dislocation prevention with a 36 head.  Risks and benefits were discussed.  Consent will be obtained based on our discussion today, including risk for  infection, DVT, dislocation, need for revision, and other complicating  features.  We discussed the postoperative course and based on his wife's  situation, unless he is doing remarkably well at 3-4 days, he will probably  need to go to a skilled nursing facility short-term versus the rehab center.  We will try to work on that during his hospital stay.      Madlyn Frankel Charlann Boxer, M.D.  Electronically Signed     MDO/MEDQ  D:  06/23/2006  T:  06/24/2006  Job:  [id number]

## 2011-03-08 NOTE — Assessment & Plan Note (Signed)
[facility]                               PULMONARY OFFICE NOTE   NAME:[name], Stephen BARIA                   MRN:          [id number]  DATE:08/26/2006                            DOB:          Nov 12, 1933    PROBLEM:  1. Asthma.  2. Allergic rhinitis.  3. Esophageal reflux.  4. Seizure disorder.   HISTORY:  First visit since December.  Wife had been in hospital this  summer.  That had apparently been stressful for him.  He has a history of a  seizure disorder managed by Dr. Sandria Manly and says that Dr. Sandria Manly had adjusted his  medication downward.  In the waiting room today, while waiting for this  appointment, he had what his wife described as a seizure and fell.  He is  now alert and appears comfortable and articulate.  He says there was no aura  and no post ictal confusion, no incontinence or injury.  He stood in the  lobby and passed out.  He was out only a few seconds at most with no motor  activity.  He has not been aware of previous orthostasis, but runs a low  blood pressure.  He had had hip surgery in September with no history of deep  vein thrombosis.  He is not dyspneic now.  He says he almost never needs his  albuterol inhaler and just uses Advair 250/50.  He stopped allergy vaccine a  year ago.   MEDICATION:  1. Advair 250/50.  2. Synthroid.  3. Nexium b.i.d.  4. Zetia.  5. Lovastatin.  6. Multivitamins.  7. Lamictal.   Drug intolerance of SULFA.   OBJECTIVE:  No obvious traumatic injury.  He is alert, articulate,  responsive and oriented as noted.  He denies discomfort.  He was not weighed, sitting in a wheelchair after this episode.  Blood  pressure 102/58.  Pulse regular 65. Room air saturation 97%.  Quiet, clear chest with unlabored breathing.  No cough or wheeze.  No neck vein distention or strider.  Regular pulse, no murmur or gallop audible.  There is no peripheral edema, cyanosis, or clubbing.  His calves are soft.  Negative  Homan's.   IMPRESSION:  1. Orthostatic faint with transient syncope and no injury.  2. Recent hip replacement.  I considered but do not believe the      circumstances described a pulmonary embolism.  3. Mild intermittent asthma.  4. Allergic rhinitis, inactive.   PLAN:  1. We discussed stasis precautions and orthostatic precautions, noting      that he is scheduled already for follow up with Dr. Sandria Manly later today,      according to his wife.  2. Continue Advair.  3. Schedule return in 1 year, earlier as needed.     Clinton D. Maple Hudson, MD, Tonny Bollman, FACP  Electronically Signed    CDY/MedQ  DD: 08/27/2006  DT: 08/27/2006  Job #: [id number]   cc:   Janae Bridgeman. Eloise Harman., M.D.  Genene Churn. Love, M.D.  Madlyn Frankel Charlann Boxer, M.D.

## 2011-03-08 NOTE — Op Note (Signed)
NAME:  [name], [name]                      ACCOUNT NO.:  [id number]   MEDICAL RECORD NO.:  [id number]                   PATIENT TYPE:  AMB   LOCATION:  ENDO                                 FACILITY[facility]   PHYSICIAN:  Georgiana Spinner, M.D.                 DATE OF BIRTH:  1934-05-24   DATE OF PROCEDURE:  DATE OF DISCHARGE:                                 OPERATIVE REPORT   PROCEDURE:  Colonoscopy.   INDICATIONS:  Colon polyps.   ANESTHESIA:  Demerol 20, Versed 2 mg.   PROCEDURE:  With the patient mildly sedated in the left lateral decubitus  position, the Olympus videoscopic colonoscope was inserted into the rectum  after normal rectal exam and passed under direct vision to the cecum,  identified by ileocecal valve and the base of the cecum.  In the base of the  cecum, there was a small polyp that was photographed and removed using hot  biopsy forceps technique at a setting of 20/20 blended current.  The  endoscope was then slowly withdrawn, taking circumferential views of the  remaining colonic mucosa, stopping in the ascending colon to photograph  diverticula and also in the sigmoid colon to photograph diverticula seen in  these regions.  Stopping next at 25 cm from the anal verge, at which point a  polyp was seen, photographed and removed, using snare cautery technique,  again a setting of 20/20 blended current.  Tissue was retrieved by  suctioning the polyp into the colonoscope.  The endoscope was then pulled  back all the way to the rectum, which showed a small polyp and hemorrhoids  on retroflexed view.  The polyp was removed using a hot biopsy forceps  technique, again in a setting of 20/20 blended current.  The endoscope was  straightened and withdrawn.  Patient's vital signs and pulse oximetry  remained stable.  Patient tolerated the procedure well without apparent  complications.   FINDINGS:  Internal hemorrhoids, diverticula of the sigmoid colon and  ascending  colon, polyps of the cecum, rectum at 25 cm from the anal verge,  all removed.   PLAN:  Await biopsy report.  Patient will call me for results and follow up  with me as an outpatient.  See endoscopy note for further details as well.                                               Georgiana Spinner, M.D.    GMO/MEDQ  D:  05/28/2004  T:  05/28/2004  Job:  [id number]

## 2011-03-08 NOTE — Op Note (Signed)
NAME:  [name], [name]                      ACCOUNT NO.:  [id number]   MEDICAL RECORD NO.:  [id number]                   PATIENT TYPE:  AMB   LOCATION:  ENDO                                 FACILITY:  [facility]   PHYSICIAN:  Georgiana Spinner, M.D.                 DATE OF BIRTH:  1933/10/28   DATE OF PROCEDURE:  DATE OF DISCHARGE:                                 OPERATIVE REPORT   Audio too short to transcribe (less than 5 seconds)                                               Georgiana Spinner, M.D.    GMO/MEDQ  D:  05/28/2004  T:  05/28/2004  Job:  [id number]

## 2011-03-08 NOTE — Op Note (Signed)
NAME:  [name], [name]                      ACCOUNT NO.:  [id number]   MEDICAL RECORD NO.:  [id number]                   PATIENT TYPE:  AMB   LOCATION:  ENDO                                 FACILITY:  [facility]   PHYSICIAN:  Georgiana Spinner, M.D.                 DATE OF BIRTH:  [birth year]   DATE OF PROCEDURE:  05/28/2004  DATE OF DISCHARGE:                                 OPERATIVE REPORT   PROCEDURE:  Upper endoscopy.   INDICATIONS:  Barrett's.   ANESTHESIA:  Demerol 60, Versed 7 mg.   DESCRIPTION OF PROCEDURE:  With patient mildly sedated in the left lateral  decubitus position, the Olympus videoscopic endoscope was inserted in the  mouth, passed under direct vision through the esophagus, which appeared  normal until we reached the distal esophagus, and there were clear changes  of Barrett's esophagus, photographed, and multiple biopsies taken in all  four quadrants.  We then entered into the stomach.  Fundus, body, antrum,  duodenal bulb, second portion of duodenum were visualized.  From this point,  the endoscope was slowly withdrawn, taking circumferential views of the  duodenal mucosa until the endoscope then pulled back into the stomach,  placed in retroflexion to view the stomach from below.  The endoscope was  then straightened and withdrawn, taking circumferential views of the  remaining gastric and esophageal mucosa.  The patient's vital signs and  pulse oximeter remained stable.  The patient tolerated the procedure well  without apparent complications.   FINDINGS:  1. Barrett's esophagus, biopsied.  Await biopsy report.  The patient will     call me for results and follow up with me as an outpatient.  2. Proceed to colonoscopy as planned.                                               Georgiana Spinner, M.D.    GMO/MEDQ  D:  05/28/2004  T:  05/28/2004  Job:  [id number]

## 2011-03-08 NOTE — Op Note (Signed)
NAME:  [name], BUELL NO.:  [id number]   MEDICAL RECORD NO.:  [id number]          PATIENT TYPE:  AMB   LOCATION:  ENDO                         FACILITY:  [facility]   PHYSICIAN:  Georgiana Spinner, M.D.    DATE OF BIRTH:  [birth year]   DATE OF PROCEDURE:  12/11/2004  DATE OF DISCHARGE:                                 OPERATIVE REPORT   PROCEDURE PERFORMED:  Upper endoscopy with biopsy.   ENDOSCOPIST:  Georgiana Spinner, M.D.   INDICATIONS FOR PROCEDURE:  Barrett's esophagus.   ANESTHESIA:  Demerol 50 mg, Versed 5 mg.   DESCRIPTION OF PROCEDURE:  With the patient mildly sedated in the left  lateral decubitus position, the Olympus videoscopic endoscope was inserted  in the mouth and passed under direct vision into the esophagus which  appeared normal until we reached the distal esophagus and there was  extensive Barrett's esophagus, photographed and multiple biopsies taken of  this area.  We entered into the stomach.  The fundus, body, antrum, duodenal  bulb and second portion of the duodenum were visualized.  From this point,  the endoscope was slowly withdrawn taking circumferential views of the  duodenal mucosa until the endoscope was pulled back into the stomach and  placed on retroflexion to view the stomach from below and a loose wrap of  the gastroesophageal junction around the endoscope was noted.  The endoscope  was then straightened and withdrawn taking circumferential views of the  remaining gastric and esophageal mucosa.  The patient's vital signs and  pulse oximeter remained stable.  The patient tolerated the procedure well  without apparent complications.   FINDINGS:  Barrett's esophagus biopsied.  Await biopsy report.  The patient  will call me for results and follow up with me as an outpatient.      GMO/MEDQ  D:  12/11/2004  T:  12/11/2004  Job:  [id number]

## 2011-03-08 NOTE — Op Note (Signed)
   TNAMEDAL, BLEW NO.:  [id number]   MEDICAL RECORD NO.:  [id number]                   PATIENT TYPE:  AMB   LOCATION:  ENDO                                 [facility]ACILITY:  [facility]   PHYSICIAN:  Georgiana Spinner, M.D.                 DATE OF BIRTH:  1934/08/01   DATE OF PROCEDURE:  DATE OF DISCHARGE:                                 OPERATIVE REPORT   PROCEDURE:  Colonoscopy with polypectomy.   INDICATIONS FOR PROCEDURE:  Colon polyp.   ANESTHESIA:  Demerol 30, Versed 2 mg.   DESCRIPTION OF PROCEDURE:  With the patient mildly sedated in the left  lateral decubitus position, the Olympus videoscopic colonoscope was inserted  in the rectum after a normal rectal exam and passed under direct vision to  the cecum identified by the ileocecal valve and appendiceal orifice. From  this point, the colonoscope was slowly withdrawn taking circumferential  views of the entire colonic mucosa stopping only at 20 cm before reaching  the rectum. There at 20 cm, two small polyps were seen, photographed and  removed using snare cautery technique on a setting of 20:20 blended current.  Tissue was retrieved. In the rectum, the endoscope was placed in  retroflexion to view the anal canal from above. Hemorrhoids were seen,  photographed and endoscope was straightened and withdrawn. The patient's  vital signs and pulse oximeter remained stable. The patient tolerated the  procedure well without apparent complications.   FINDINGS:  Internal hemorrhoids, polyps at 20 cm, some diverticula in the  sigmoid colon.   PLAN:  Await biopsy report. The patient will call with the results and  followup with me as an outpatient.                                                Georgiana Spinner, M.D.    GMO/MEDQ  D:  06/10/2002  T:  06/11/2002  Job:  [id number]

## 2011-03-08 NOTE — Op Note (Signed)
NAME:  [name], [name] NO.:  [id number]   MEDICAL RECORD NO.:  [id number]          PATIENT TYPE:  AMB   LOCATION:  ENDO                         FACILITY:  [facility]   PHYSICIAN:  Georgiana Spinner, M.D.    DATE OF BIRTH:  Feb 21, 1934   DATE OF PROCEDURE:  02/26/2007  DATE OF DISCHARGE:                               OPERATIVE REPORT   PROCEDURE:  Colonoscopy.   INDICATIONS:  Colon polyps.   ANESTHESIA:  Fentanyl 25 mcg, Versed 2.5 mg   PROCEDURE:  With the patient mildly sedated in the left lateral  decubitus position, a rectal exam was performed, which was unremarkable.  The prostate was slightly enlarged, but nonnodular.  Subsequently, the  Pentax videoscopic colonoscope was inserted in the rectum and passed  under direct vision with pressure applied and the patient rolled to his  back.  We reached the cecum, as identified by the crow's foot of the  cecum and ileocecal valve, both of which were photographed.  From this  point, the colonoscope was slowly withdrawn, taking circumferential  views of colonic mucosa, stopping one fold removed from the ileocecal  valve, at which point a small polyp approximately 3-4 mm in size was  photographed and removed using hot biopsy forceps technique, setting of  20/200 blended current.  We next stopped in the proximal transverse  colon, where a second polyp was seen.  It, too, was removed using hot  biopsy forceps technique with the same setting.  We then stopped in the  rectum, which appeared normal.  On direct it showed large hemorrhoids on  retroflexed view.  The endoscope was straightened and withdrawn.  The  patient's vital signs and pulse oximeter remained stable.  The patient  tolerated the procedure well without apparent complications.   FINDINGS:  Diverticulosis of sigmoid colon was noted.  Internal  hemorrhoids.  Polyps of transverse colon, proximal, and of the cecum.  Await biopsy report.  The patient will call me for  results and follow up  with me as needed as an outpatient.           ______________________________  Georgiana Spinner, M.D.     GMO/MEDQ  D:  02/26/2007  T:  02/26/2007  Job:  [id number]

## 2011-03-08 NOTE — Op Note (Signed)
NAME:  [name], [name] NO.:  [id number]   MEDICAL RECORD NO.:  [id number]          PATIENT TYPE:  AMB   LOCATION:  ENDO                         FACILITY:  [facility]   PHYSICIAN:  Georgiana Spinner, M.D.    DATE OF BIRTH:  December 27, 1933   DATE OF PROCEDURE:  12/23/2005  DATE OF DISCHARGE:                                 OPERATIVE REPORT   PROCEDURE:  Upper endoscopy with biopsy.   INDICATIONS:  Barrett's esophagus.   ANESTHESIA:  Demerol 50 Versed 5 milligrams.   PROCEDURE:  With the patient mildly sedated in the left lateral decubitus  position the Olympus videoscopic endoscope was inserted in the mouth, was  passed under direct vision through the esophagus which appeared normal until  we reached the distal esophagus and there was extensive Barrett's  photographed and biopsied. We entered into the stomach.  The fundus, body,  antrum, duodenal bulb, second portion duodenum were visualized.  From this  point the endoscope was slowly withdrawn taking circumferential views of  duodenal mucosa until the endoscope had been pulled back into the stomach  and placed in retroflexion to view the stomach from below.  The endoscope  was straightened and withdrawn taking circumferential views of remaining  gastric and esophageal mucosa. The patient's vital signs, pulse oximeter  remained stable. The patient tolerated procedure well without apparent  complications.   FINDINGS:  Extensive Barrett's esophagus. Await biopsy report. The patient  will call me for results and follow-up with me as an outpatient.           ______________________________  Georgiana Spinner, M.D.     GMO/MEDQ  D:  12/23/2005  T:  12/24/2005  Job:  [id number]

## 2011-03-14 ENCOUNTER — Other Ambulatory Visit (HOSPITAL_COMMUNITY): Payer: Self-pay | Admitting: Internal Medicine

## 2011-03-14 DIAGNOSIS — R011 Cardiac murmur, unspecified: Secondary | ICD-10-CM

## 2011-03-20 ENCOUNTER — Other Ambulatory Visit (HOSPITAL_COMMUNITY): Payer: Self-pay

## 2011-03-25 ENCOUNTER — Ambulatory Visit (HOSPITAL_COMMUNITY): Payer: Medicare Other | Attending: Internal Medicine | Admitting: Radiology

## 2011-03-25 DIAGNOSIS — I379 Nonrheumatic pulmonary valve disorder, unspecified: Secondary | ICD-10-CM | POA: Insufficient documentation

## 2011-03-25 DIAGNOSIS — I080 Rheumatic disorders of both mitral and aortic valves: Secondary | ICD-10-CM | POA: Insufficient documentation

## 2011-03-25 DIAGNOSIS — R011 Cardiac murmur, unspecified: Secondary | ICD-10-CM

## 2011-03-25 DIAGNOSIS — I079 Rheumatic tricuspid valve disease, unspecified: Secondary | ICD-10-CM | POA: Insufficient documentation

## 2011-03-29 ENCOUNTER — Telehealth: Payer: Self-pay | Admitting: Internal Medicine

## 2011-03-29 NOTE — Telephone Encounter (Signed)
Faxed Echo. to DJ @ [facility] ([phone]).

## 2011-04-17 ENCOUNTER — Telehealth: Payer: Self-pay | Admitting: Internal Medicine

## 2011-04-17 NOTE — Telephone Encounter (Signed)
Spoke with pt. He states his spouse has had "cold or something" x several wks. Now he has started coughing for the past 2 days, prod with white sputum. He states already has otc cough syrup, and this helped some today. He states "I may not need anything now", but wanted recs from [facility]Y anyway. Please advise, thanks! Allergies  Allergen Reactions  . Sulfonamide Derivatives

## 2011-04-18 NOTE — Telephone Encounter (Signed)
LMTCB

## 2011-04-18 NOTE — Telephone Encounter (Signed)
If he develops fever or discolored sputum we can call in an antibiotic. Otherwise, fluids/ mucinex and comfort treatment will serve just as well.

## 2011-04-19 NOTE — Telephone Encounter (Signed)
LMTCBx2. [name], CMA  

## 2011-04-23 NOTE — Telephone Encounter (Signed)
Spoke with pt's spouse and pt and notified of recs per CD[facility]. Pt states that pt is much better without any more coughing. Nothing further needed.

## 2011-10-04 ENCOUNTER — Encounter: Payer: Self-pay | Admitting: Internal Medicine

## 2011-10-07 ENCOUNTER — Ambulatory Visit (INDEPENDENT_AMBULATORY_CARE_PROVIDER_SITE_OTHER): Payer: Medicare Other | Admitting: Internal Medicine

## 2011-10-07 ENCOUNTER — Encounter: Payer: Self-pay | Admitting: Internal Medicine

## 2011-10-07 VITALS — BP 108/58 | HR 69 | Ht 68.5 in | Wt 158.2 lb

## 2011-10-07 DIAGNOSIS — K219 Gastro-esophageal reflux disease without esophagitis: Secondary | ICD-10-CM

## 2011-10-07 DIAGNOSIS — J45909 Unspecified asthma, uncomplicated: Secondary | ICD-10-CM

## 2011-10-07 MED ORDER — BECLOMETHASONE DIPROPIONATE 40 MCG/ACT IN AERS: INHALATION_SPRAY | RESPIRATORY_TRACT | Status: DC

## 2011-10-07 NOTE — Progress Notes (Signed)
10/07/11- 77 yom never smoker followed for allergic rhinitis, bronchitis complicated by GERD, history of seizure LOV-10/08/2010. Has had flu vaccine. Reports doing very well since last here. Uses Advair one time daily. Rare need for rescue inhaler. He asks about medication cheaper than Advair. He has not been waking at night choking in a way that would suggest reflux.  ROS-see HPI Constitutional:   No-   weight loss, night sweats, fevers, chills, fatigue, lassitude. HEENT:   No-  headaches, difficulty swallowing, tooth/dental problems, sore throat,       No-  sneezing, itching, ear ache, nasal congestion, post nasal drip,  CV:  No-   chest pain, orthopnea, PND, swelling in lower extremities, anasarca,                                  dizziness, palpitations Resp: No-   shortness of breath with exertion or at rest.              No-   productive cough,  No non-productive cough,  No- coughing up of blood.              No-   change in color of mucus.  No- wheezing.   Skin: No-   rash or lesions. GI:  No-   heartburn, indigestion, abdominal pain, nausea, vomiting, diarrhea,                 change in bowel habits, loss of appetite GU:MS:  No-   joint pain or swelling.  No- decreased range of motion.  No- back pain. Neuro-     Psych:    OBJ General- Alert, Oriented, Affect-appropriate, Distress- none acute, slim Skin- rash-none, lesions- none, excoriation- none Lymphadenopathy- none Head- atraumatic            Eyes- Gross vision intact, PERRLA, conjunctivae clear secretions            Ears- Hearing, canals-normal            Nose- Clear, no-Septal dev, mucus, polyps, erosion, perforation             Throat- Mallampati II , mucosa clear , drainage- none, tonsils- atrophic Neck- flexible , trachea midline, no stridor , thyroid nl, carotid no bruit Chest - symmetrical excursion , unlabored           Heart/CV- RRR , no murmur , no gallop  , no rub, nl s1 s2                           - JVD- none ,  edema- none, stasis changes- none, varices- none           Lung- clear to P&A, wheeze- none, cough- none , dullness-none, rub- none           Chest wall-  Abd- tender-no, distended-no, bowel sounds-present, HSM- no Br/ Gen/ Rectal- Not done, not indicated Extrem- cyanosis- none, clubbing, none, atrophy- none, strength- nl Neuro- grossly intact to observation

## 2011-10-07 NOTE — Patient Instructions (Signed)
Try changing from Advair to Qvar-   Sample Qvar 80-----  2 puffs, then rinse mouth well, once daily. If it is helpful, I have printed a script for the Qvar 40, to take the same way.  If Qvar doesn't hold you, then go back to Advair, 1 puff and rinse, once or twice daily.  Please call as needed

## 2011-10-09 NOTE — Assessment & Plan Note (Signed)
By his description, symptoms have been well controlled recently. It does not sound as if he is at risk for aspiration.

## 2011-10-09 NOTE — Assessment & Plan Note (Signed)
No acute events and control has been good. Respecting the cost issue, we're going to try changing from Advair to Qvar 40 as discussed.

## 2011-10-17 ENCOUNTER — Other Ambulatory Visit: Payer: Self-pay | Admitting: Internal Medicine

## 2011-11-02 ENCOUNTER — Other Ambulatory Visit: Payer: Self-pay | Admitting: Internal Medicine

## 2011-11-13 DIAGNOSIS — Z125 Encounter for screening for malignant neoplasm of prostate: Secondary | ICD-10-CM | POA: Diagnosis not present

## 2011-11-13 DIAGNOSIS — E785 Hyperlipidemia, unspecified: Secondary | ICD-10-CM | POA: Diagnosis not present

## 2011-11-13 DIAGNOSIS — R5381 Other malaise: Secondary | ICD-10-CM | POA: Diagnosis not present

## 2011-11-13 DIAGNOSIS — E039 Hypothyroidism, unspecified: Secondary | ICD-10-CM | POA: Diagnosis not present

## 2011-11-15 DIAGNOSIS — Z1212 Encounter for screening for malignant neoplasm of rectum: Secondary | ICD-10-CM | POA: Diagnosis not present

## 2011-11-20 DIAGNOSIS — I359 Nonrheumatic aortic valve disorder, unspecified: Secondary | ICD-10-CM | POA: Diagnosis not present

## 2011-11-20 DIAGNOSIS — E785 Hyperlipidemia, unspecified: Secondary | ICD-10-CM | POA: Diagnosis not present

## 2011-11-20 DIAGNOSIS — Z125 Encounter for screening for malignant neoplasm of prostate: Secondary | ICD-10-CM | POA: Diagnosis not present

## 2011-11-20 DIAGNOSIS — Z0000 Encounter for general adult medical examination without abnormal findings: Secondary | ICD-10-CM | POA: Diagnosis not present

## 2011-11-20 DIAGNOSIS — E039 Hypothyroidism, unspecified: Secondary | ICD-10-CM | POA: Diagnosis not present

## 2011-11-20 DIAGNOSIS — Z23 Encounter for immunization: Secondary | ICD-10-CM | POA: Diagnosis not present

## 2011-12-03 DIAGNOSIS — R413 Other amnesia: Secondary | ICD-10-CM | POA: Diagnosis not present

## 2012-01-06 ENCOUNTER — Telehealth: Payer: Self-pay | Admitting: Internal Medicine

## 2012-01-06 MED ORDER — AZITHROMYCIN 250 MG PO TABS: ORAL_TABLET | ORAL | Status: AC

## 2012-01-06 NOTE — Telephone Encounter (Signed)
Z pak is a good med for this kind of problem and we use it without problem all the time, but I would be happy to change it if they prefer. Could use doxycline 100 mg, # 8, 2 today then once daily. The issue has been with prolongation of QT interval in patients with cardiac conduction problems on their EKG. I am not aware that he has had that sort of problem, so please add to problem list if he has.

## 2012-01-06 NOTE — Telephone Encounter (Signed)
I spoke with pt and he c/o cough w/ lots of white phlem, wheezing, chest tightness, increase SOB, chest congestion, feels like he is running a fever but has not checked it x 4 days. Denies any chills, sweats, body aches, sore throat. Pt has been taking robitussin night and daytime and it helps some. Pt is requesting to be seen today but nothing available. Please advise Dr. Maple Hudson, thanks  Allergies  Allergen Reactions  . Sulfonamide Derivatives      Transport planner

## 2012-01-06 NOTE — Telephone Encounter (Signed)
Called and spoke with pt's wife and informed her of CY's response/recs.   Wife verbalized understanding and stating pt doesn't have a cardiac history so she stated pt will take the zpak.  Nothing further needed.

## 2012-01-06 NOTE — Telephone Encounter (Signed)
Called, spoke with pt.  He is ok with a z pak being sent in to [facility] and will pick up Delsym otc for cough.  Advised to call back or seek emergency care if symptoms do not improve or worsen.  He verbalized understanding of this and voiced no further questions/concerns at this time.

## 2012-01-06 NOTE — Telephone Encounter (Signed)
Dr. Maple Hudson, pt's wife is concerned about him taking z pak d/t the recent information displayed in news.  Pls advise.  Thank you.

## 2012-01-06 NOTE — Telephone Encounter (Signed)
Offer Z pak and suggest Delsym otc for cough

## 2012-01-27 ENCOUNTER — Other Ambulatory Visit: Payer: Self-pay | Admitting: Internal Medicine

## 2012-04-20 ENCOUNTER — Other Ambulatory Visit: Payer: Self-pay | Admitting: Gastroenterology

## 2012-04-20 DIAGNOSIS — K2960 Other gastritis without bleeding: Secondary | ICD-10-CM | POA: Diagnosis not present

## 2012-04-20 DIAGNOSIS — K449 Diaphragmatic hernia without obstruction or gangrene: Secondary | ICD-10-CM | POA: Diagnosis not present

## 2012-04-20 DIAGNOSIS — K229 Disease of esophagus, unspecified: Secondary | ICD-10-CM | POA: Diagnosis not present

## 2012-04-20 DIAGNOSIS — D131 Benign neoplasm of stomach: Secondary | ICD-10-CM | POA: Diagnosis not present

## 2012-04-20 DIAGNOSIS — K2270 Barrett's esophagus without dysplasia: Secondary | ICD-10-CM | POA: Diagnosis not present

## 2012-05-06 DIAGNOSIS — M25559 Pain in unspecified hip: Secondary | ICD-10-CM | POA: Diagnosis not present

## 2012-05-06 DIAGNOSIS — K2270 Barrett's esophagus without dysplasia: Secondary | ICD-10-CM | POA: Diagnosis not present

## 2012-05-18 DIAGNOSIS — M19079 Primary osteoarthritis, unspecified ankle and foot: Secondary | ICD-10-CM | POA: Diagnosis not present

## 2012-05-18 DIAGNOSIS — M25559 Pain in unspecified hip: Secondary | ICD-10-CM | POA: Diagnosis not present

## 2012-05-28 DIAGNOSIS — IMO0002 Reserved for concepts with insufficient information to code with codable children: Secondary | ICD-10-CM | POA: Diagnosis not present

## 2012-05-29 DIAGNOSIS — R413 Other amnesia: Secondary | ICD-10-CM | POA: Diagnosis not present

## 2012-05-29 DIAGNOSIS — G40219 Localization-related (focal) (partial) symptomatic epilepsy and epileptic syndromes with complex partial seizures, intractable, without status epilepticus: Secondary | ICD-10-CM | POA: Diagnosis not present

## 2012-05-29 DIAGNOSIS — E039 Hypothyroidism, unspecified: Secondary | ICD-10-CM | POA: Diagnosis not present

## 2012-07-01 DIAGNOSIS — IMO0002 Reserved for concepts with insufficient information to code with codable children: Secondary | ICD-10-CM | POA: Diagnosis not present

## 2012-08-04 DIAGNOSIS — Z23 Encounter for immunization: Secondary | ICD-10-CM | POA: Diagnosis not present

## 2012-08-11 DIAGNOSIS — N400 Enlarged prostate without lower urinary tract symptoms: Secondary | ICD-10-CM | POA: Diagnosis not present

## 2012-08-12 ENCOUNTER — Other Ambulatory Visit: Payer: Self-pay | Admitting: *Deleted

## 2012-08-12 ENCOUNTER — Telehealth: Payer: Self-pay | Admitting: Internal Medicine

## 2012-08-12 MED ORDER — FLUTICASONE-SALMETEROL 250-50 MCG/DOSE IN AEPB: 1.0000 | INHALATION_SPRAY | Freq: Two times a day (BID) | RESPIRATORY_TRACT | Status: DC

## 2012-08-12 NOTE — Telephone Encounter (Signed)
[facility] already sent in a refill on advair

## 2012-09-09 DIAGNOSIS — H524 Presbyopia: Secondary | ICD-10-CM | POA: Diagnosis not present

## 2012-09-09 DIAGNOSIS — H52209 Unspecified astigmatism, unspecified eye: Secondary | ICD-10-CM | POA: Diagnosis not present

## 2012-09-09 DIAGNOSIS — H5200 Hypermetropia, unspecified eye: Secondary | ICD-10-CM | POA: Diagnosis not present

## 2012-09-09 DIAGNOSIS — H2510 Age-related nuclear cataract, unspecified eye: Secondary | ICD-10-CM | POA: Diagnosis not present

## 2012-10-27 ENCOUNTER — Ambulatory Visit: Payer: Medicare Other | Admitting: Internal Medicine

## 2012-11-11 DIAGNOSIS — M47817 Spondylosis without myelopathy or radiculopathy, lumbosacral region: Secondary | ICD-10-CM | POA: Diagnosis not present

## 2012-11-11 DIAGNOSIS — M545 Low back pain, unspecified: Secondary | ICD-10-CM | POA: Diagnosis not present

## 2012-11-11 DIAGNOSIS — M25559 Pain in unspecified hip: Secondary | ICD-10-CM | POA: Diagnosis not present

## 2012-11-11 DIAGNOSIS — M533 Sacrococcygeal disorders, not elsewhere classified: Secondary | ICD-10-CM | POA: Diagnosis not present

## 2012-11-11 DIAGNOSIS — Z96649 Presence of unspecified artificial hip joint: Secondary | ICD-10-CM | POA: Diagnosis not present

## 2012-11-20 ENCOUNTER — Ambulatory Visit: Payer: Medicare Other | Admitting: Internal Medicine

## 2012-12-02 DIAGNOSIS — R413 Other amnesia: Secondary | ICD-10-CM | POA: Diagnosis not present

## 2012-12-02 DIAGNOSIS — R42 Dizziness and giddiness: Secondary | ICD-10-CM | POA: Diagnosis not present

## 2012-12-02 DIAGNOSIS — G40219 Localization-related (focal) (partial) symptomatic epilepsy and epileptic syndromes with complex partial seizures, intractable, without status epilepticus: Secondary | ICD-10-CM | POA: Diagnosis not present

## 2012-12-02 DIAGNOSIS — E039 Hypothyroidism, unspecified: Secondary | ICD-10-CM | POA: Diagnosis not present

## 2012-12-07 ENCOUNTER — Other Ambulatory Visit: Payer: Self-pay | Admitting: Neurology

## 2012-12-07 DIAGNOSIS — E039 Hypothyroidism, unspecified: Secondary | ICD-10-CM

## 2012-12-07 DIAGNOSIS — R413 Other amnesia: Secondary | ICD-10-CM

## 2012-12-07 DIAGNOSIS — G40219 Localization-related (focal) (partial) symptomatic epilepsy and epileptic syndromes with complex partial seizures, intractable, without status epilepticus: Secondary | ICD-10-CM

## 2012-12-07 DIAGNOSIS — R42 Dizziness and giddiness: Secondary | ICD-10-CM

## 2012-12-12 ENCOUNTER — Ambulatory Visit
Admission: RE | Admit: 2012-12-12 | Discharge: 2012-12-12 | Disposition: A | Discharge status: Home / Self Care | Payer: Medicare Other | Source: Ambulatory Visit | Attending: Neurology | Admitting: Neurology

## 2012-12-12 DIAGNOSIS — R413 Other amnesia: Secondary | ICD-10-CM

## 2012-12-12 DIAGNOSIS — G40219 Localization-related (focal) (partial) symptomatic epilepsy and epileptic syndromes with complex partial seizures, intractable, without status epilepticus: Secondary | ICD-10-CM

## 2012-12-12 DIAGNOSIS — R42 Dizziness and giddiness: Secondary | ICD-10-CM | POA: Diagnosis not present

## 2012-12-12 DIAGNOSIS — E039 Hypothyroidism, unspecified: Secondary | ICD-10-CM

## 2012-12-24 ENCOUNTER — Ambulatory Visit (INDEPENDENT_AMBULATORY_CARE_PROVIDER_SITE_OTHER): Payer: Medicare Other | Admitting: Internal Medicine

## 2012-12-24 ENCOUNTER — Encounter: Payer: Self-pay | Admitting: Internal Medicine

## 2012-12-24 VITALS — BP 118/58 | HR 66 | Ht 68.5 in | Wt 155.0 lb

## 2012-12-24 DIAGNOSIS — J45909 Unspecified asthma, uncomplicated: Secondary | ICD-10-CM

## 2012-12-24 NOTE — Patient Instructions (Addendum)
We can continue Advair 250, 1 puff daily as long as you seem to need it.  We can continue the rescue albuterol inhaler if needed  Please call for problems or concerns

## 2012-12-24 NOTE — Progress Notes (Signed)
10/07/11- 77 yom never smoker followed for allergic rhinitis, bronchitis complicated by GERD, history of seizure LOV-10/08/2010. Has had flu vaccine. Reports doing very well since last here. Uses Advair one time daily. Rare need for rescue inhaler. He asks about medication cheaper than Advair. He has not been waking at night choking in a way that would suggest reflux.  12/24/12- 78 yom never smoker followed for allergic rhinitis, bronchitis complicated by GERD, history of seizure FOLLOWS FOR: denies any SOB, wheezing, cough, or congestion at this time. He tried Qvar inhaler but it was not enough control. He went back to Advair 250 used just once daily. He has had no asthma or wheezing and no flu episodes this winter. Rarely needs his rescue inhaler.  ROS-see HPI Constitutional:   No-   weight loss, night sweats, fevers, chills, fatigue, lassitude. HEENT:   No-  headaches, difficulty swallowing, tooth/dental problems, sore throat,       No-  sneezing, itching, ear ache, nasal congestion, post nasal drip,  CV:  No-   chest pain, orthopnea, PND, swelling in lower extremities, anasarca,                                  dizziness, palpitations Resp: No-   shortness of breath with exertion or at rest.              No-   productive cough,  No non-productive cough,  No- coughing up of blood.              No-   change in color of mucus.  No- wheezing.   Skin: No-   rash or lesions. GI:  No-   heartburn, indigestion, abdominal pain, nausea, vomiting,  GU:MS:  No-   joint pain or swelling.   Neuro-     Psych:    OBJ General- Alert, Oriented, Affect-appropriate, Distress- none acute, slim Skin- rash-none, lesions- none, excoriation- none Lymphadenopathy- none Head- atraumatic            Eyes- Gross vision intact, PERRLA, conjunctivae clear secretions            Ears- Hearing, canals-normal            Nose- Clear, no-Septal dev, mucus, polyps, erosion, perforation             Throat- Mallampati II ,  mucosa clear , drainage- none, tonsils- atrophic Neck- flexible , trachea midline, no stridor , thyroid nl, carotid no bruit Chest - symmetrical excursion , unlabored           Heart/CV- RRR , no murmur , no gallop  , no rub, nl s1 s2                           - JVD- none , edema- none, stasis changes- none, varices- none           Lung- clear to P&A, wheeze- none, cough- none , dullness-none, rub- none           Chest wall-  Abd-  Br/ Gen/ Rectal- Not done, not indicated Extrem- cyanosis- none, clubbing, none, atrophy- none, strength- nl Neuro- grossly intact to observation

## 2012-12-24 NOTE — Assessment & Plan Note (Signed)
We discussed his treatment. He will continue with Advair 250 is just one puff once daily. If he feels asthma is increasing then he will change to one puff twice daily. He expressed understanding of this strategy.

## 2012-12-30 DIAGNOSIS — Z125 Encounter for screening for malignant neoplasm of prostate: Secondary | ICD-10-CM | POA: Diagnosis not present

## 2012-12-30 DIAGNOSIS — R5381 Other malaise: Secondary | ICD-10-CM | POA: Diagnosis not present

## 2012-12-30 DIAGNOSIS — E785 Hyperlipidemia, unspecified: Secondary | ICD-10-CM | POA: Diagnosis not present

## 2013-01-04 DIAGNOSIS — Z1212 Encounter for screening for malignant neoplasm of rectum: Secondary | ICD-10-CM | POA: Diagnosis not present

## 2013-01-06 DIAGNOSIS — G40309 Generalized idiopathic epilepsy and epileptic syndromes, not intractable, without status epilepticus: Secondary | ICD-10-CM | POA: Diagnosis not present

## 2013-01-06 DIAGNOSIS — E785 Hyperlipidemia, unspecified: Secondary | ICD-10-CM | POA: Diagnosis not present

## 2013-01-06 DIAGNOSIS — J449 Chronic obstructive pulmonary disease, unspecified: Secondary | ICD-10-CM | POA: Diagnosis not present

## 2013-01-06 DIAGNOSIS — E039 Hypothyroidism, unspecified: Secondary | ICD-10-CM | POA: Diagnosis not present

## 2013-01-06 DIAGNOSIS — J45991 Cough variant asthma: Secondary | ICD-10-CM | POA: Diagnosis not present

## 2013-01-06 DIAGNOSIS — K2270 Barrett's esophagus without dysplasia: Secondary | ICD-10-CM | POA: Diagnosis not present

## 2013-01-06 DIAGNOSIS — Z0000 Encounter for general adult medical examination without abnormal findings: Secondary | ICD-10-CM | POA: Diagnosis not present

## 2013-01-06 DIAGNOSIS — M1990 Unspecified osteoarthritis, unspecified site: Secondary | ICD-10-CM | POA: Diagnosis not present

## 2013-01-11 ENCOUNTER — Telehealth: Payer: Self-pay | Admitting: Internal Medicine

## 2013-01-11 ENCOUNTER — Other Ambulatory Visit: Payer: Self-pay | Admitting: Internal Medicine

## 2013-01-11 MED ORDER — AMOXICILLIN-POT CLAVULANATE 875-125 MG PO TABS: 1.0000 | ORAL_TABLET | Freq: Two times a day (BID) | ORAL | Status: DC

## 2013-01-11 MED ORDER — ALBUTEROL SULFATE HFA 108 (90 BASE) MCG/ACT IN AERS: 2.0000 | INHALATION_SPRAY | Freq: Four times a day (QID) | RESPIRATORY_TRACT | Status: DC | PRN

## 2013-01-11 NOTE — Telephone Encounter (Signed)
Called, spoke with pt.  Reports cough started last week but has gotten worse.  Cough is deep, worse qhs, and prod with a lot of mucus.  Mucus is black qhs.  Also reports a temp in the "upper 90s but less than 100," feeling very fatigued, has very little appetite, and increased SOB but relates this to congestion.  Denies wheezing, chest tightness, or chest pain.  Taking tessalon with no relief.  Using robitussin with some relief for "a little while," using albuterol hfa with relief, and using vicks vapor rub with relief.  Pt requesting OV today with Dr. Maple Hudson.  Pls advise.  Thank you.  Last OV with Dr. Maple Hudson 12/24/2012; follow up in 1 yr  Allergies verified with pt: Allergies  Allergen Reactions  . Sulfonamide Derivatives    [name]

## 2013-01-11 NOTE — Telephone Encounter (Signed)
Per CY: call in augmentin 875mg  #14, 1 by mouth twice daily, then ov w/ me or TP when able.  Called spoke with patient, advised of CY's recs as stated above.  Pt verbalized his understanding and denied any questions.  CY with no openings until May, pt does not want to wait that long and declined ov w/ TP x2.  Pt stated he will call back in approx 2 weeks to check for openings.    Rx sent to verified pharmacy.  Nothing further needed at this time; pt to call back if symptoms do not improve or worsen.

## 2013-01-11 NOTE — Telephone Encounter (Signed)
Spoke with patient. Requesting refill on [employer] sent in to [facility]. Rx has been sent and nothing further needed at this time.

## 2013-01-13 ENCOUNTER — Encounter: Payer: Self-pay | Admitting: Pulmonary Disease

## 2013-01-13 ENCOUNTER — Ambulatory Visit (INDEPENDENT_AMBULATORY_CARE_PROVIDER_SITE_OTHER): Payer: Medicare Other | Admitting: Pulmonary Disease

## 2013-01-13 ENCOUNTER — Ambulatory Visit (INDEPENDENT_AMBULATORY_CARE_PROVIDER_SITE_OTHER)
Admission: RE | Admit: 2013-01-13 | Discharge: 2013-01-13 | Disposition: A | Discharge status: Home / Self Care | Payer: Medicare Other | Source: Ambulatory Visit | Attending: Pulmonary Disease | Admitting: Pulmonary Disease

## 2013-01-13 ENCOUNTER — Telehealth: Payer: Self-pay | Admitting: Internal Medicine

## 2013-01-13 VITALS — BP 98/60 | HR 69 | Temp 97.9°F | Ht 67.0 in | Wt 153.0 lb

## 2013-01-13 DIAGNOSIS — R05 Cough: Secondary | ICD-10-CM | POA: Diagnosis not present

## 2013-01-13 DIAGNOSIS — J309 Allergic rhinitis, unspecified: Secondary | ICD-10-CM | POA: Diagnosis not present

## 2013-01-13 DIAGNOSIS — J209 Acute bronchitis, unspecified: Secondary | ICD-10-CM | POA: Diagnosis not present

## 2013-01-13 DIAGNOSIS — J45909 Unspecified asthma, uncomplicated: Secondary | ICD-10-CM | POA: Diagnosis not present

## 2013-01-13 DIAGNOSIS — R059 Cough, unspecified: Secondary | ICD-10-CM | POA: Diagnosis not present

## 2013-01-13 MED ORDER — PREDNISONE 5 MG PO TABS: 5.0000 mg | ORAL_TABLET | Freq: Every day | ORAL | Status: DC

## 2013-01-13 NOTE — Patient Instructions (Addendum)
Continue Amoxicillin. Start Prednisone 20 mg daily for 3 days then 10 mg for 3 days then 5 mg daily for 3 days then stop.   We will obtain a CXR today.   Please call the office if not feeling better by Fridays morning.

## 2013-01-13 NOTE — Telephone Encounter (Signed)
Spoke with pt He is c/o increased prod cough with yellow sputum and SOB  We had called in abx on 3/24 and he is worse since then OV with DA at 3:15 today Pt advised to seek emergent care sooner if needed

## 2013-01-14 ENCOUNTER — Telehealth: Payer: Self-pay | Admitting: Pulmonary Disease

## 2013-01-14 ENCOUNTER — Encounter: Payer: Self-pay | Admitting: Pulmonary Disease

## 2013-01-14 ENCOUNTER — Telehealth: Payer: Self-pay | Admitting: Internal Medicine

## 2013-01-14 NOTE — Progress Notes (Signed)
63 yom never smoker followed for allergic rhinitis, asthma/bronchitis complicated by GERD presents to clinic for an acute visit complaining of cough productive of yellow dark sputum, worse at night, subjective fever,  wheezing, nasal congestion, postnasal drip;   Monday, approximately 3 days prior to the visit the patient called the clinic and received Augmentin. He started taking it immediately; he did notice the day prior to the visit having loose stools.   Admits sick contacts, one of his friends had similar symptoms; [place]  Past Medical History  Diagnosis Date  . Seizure disorder   . Esophageal reflux   . Allergic rhinitis   . Asthma    Allergies  Allergen Reactions  . Sulfonamide Derivatives    Current Outpatient Prescriptions  Medication Sig Dispense Refill  . albuterol (PROVENTIL HFA;VENTOLIN HFA) 108 (90 BASE) MCG/ACT inhaler Inhale 2 puffs into the lungs every 6 (six) hours as needed for wheezing.  1 Inhaler  3  . amoxicillin-clavulanate (AUGMENTIN) 875-125 MG per tablet Take 1 tablet by mouth 2 (two) times daily.  14 tablet  0  . Dietary Management Product (AXONA) packet Take 40 g by mouth daily.        . finasteride (PROSCAR) 5 MG tablet Take 1 tablet by mouth daily.      . Fluticasone-Salmeterol (ADVAIR DISKUS) 250-50 MCG/DOSE AEPB Inhale 1 puff into the lungs 2 (two) times daily.  60 each  5  . levETIRAcetam (KEPPRA) 500 MG tablet 2 daily       . levothyroxine (SYNTHROID, LEVOTHROID) 125 MCG tablet Take 125 mcg by mouth daily.        . Multiple Vitamin (MULTIVITAMIN) tablet Take 1 tablet by mouth daily.        Marland Kitchen omeprazole (PRILOSEC) 20 MG capsule Take 20 mg by mouth daily.        . pravastatin (PRAVACHOL) 20 MG tablet Take 1 tablet by mouth daily.      . Pyridoxine HCl (VITAMIN B-6) 500 MG tablet Take 500 mg by mouth daily.        . vitamin B-12 (CYANOCOBALAMIN) 1000 MCG tablet Take 1,000 mcg by mouth daily.        .  benzonatate (TESSALON) 100 MG capsule Take 100 mg by mouth 3 (three) times daily as needed for cough.      . predniSONE (DELTASONE) 5 MG tablet Take 1 tablet (5 mg total) by mouth daily.  30 tablet  0   No current facility-administered medications for this visit.    Review of Systems  Constitutional: Negative for appetite change and unexpected weight change.  HENT: Per HPI   Respiratory: per HPI Cardiovascular: Negative for chest pain and leg swelling. Negative for palpitations.  Gastrointestinal: Negative for abdominal pain.  Musculoskeletal: Negative for joint swelling.  Skin: Negative for rash.  Neurological: Negative for numbness.  Psychiatric/Behavioral: Negative for dysphoric mood.    Physical Exam BP 98/60  Pulse 69  Temp(Src) 97.9 F (36.6 C) (Oral)  Ht 5\' 7"  (1.702 m)  Wt 153 lb (69.4 kg)  BMI 23.96 kg/m2  SpO2 96%  General: Comfortable, in NAD HEENT ; pupils round and reactive to light; mild nasal mucosa edema and erythema; retropharynx with no erythema, edema or exudate; no cervical LAD  Cardiovascular: s1s2, no murmurs. Regular rate and rhythm.  Respiratory: normal breath sounds bilaterally, no wheezing, rhonchi or crackles; No increased work of breathing.  Abdomen: soft NT ND BS+.   Extremities:  no pedal edema. Homans negative  Central nervous system: Alert and oriented No focal neurological deficits.  CXR 01/13/13  Reviewed and negative for acute disease; results discussed with the patient via the phone.    A/P   The patient has a mild asthma exacerbation and an upper airway infection with acute bronchitis. I advised him to continue taking Augmentin and initiate Prednisone. I advised him to call to change the antibiotic if not improving by Friday morning, or to present to the ED for worsening symptoms. Given that symptoms are worse at night and seem to be related to postnasal drip, I advised him to start Nasonex nasal spray at night.   He will follow up with  Dr. Maple Hudson.

## 2013-01-14 NOTE — Telephone Encounter (Signed)
Called, spoke with pt.   Pt states he has been taking amoxicillin x 3-4 days bid.   Is having "severe" diarrhea with this medication. Pt states he cannot function like this and requesting it be changed to something else. I did educate pt on eating yogurt which could help with some of these symptoms while on abx. He was seen yesterday by Dr. Frederico Hamman for sick visit but is CY pt. Will route msg to CY for recs. Pls advise.  Thank you.  Last OV with CDY: 12/24/12; asked to f/u in 1 yr  [facility]  Allergies verified with pt: Allergies  Allergen Reactions  . Sulfonamide Derivatives     OV instructions from OV yesterday with Dr. Frederico Hamman: Patient Instructions    Continue Amoxicillin. Start Prednisone 20 mg daily for 3 days then 10 mg for 3 days then 5 mg daily for 3 days then stop.  We will obtain a CXR today.  Please call the office if not feeling better by Fridays morning.

## 2013-01-14 NOTE — Telephone Encounter (Signed)
I spoke w/ pt and is aware of CDY recs. He voiced his understanding and will try CDY recs. He voiced his understanding and needed nothing further.

## 2013-01-14 NOTE — Telephone Encounter (Signed)
Per CY-stop Amoxicillin; ok to finish prednisone, ok to use Pepto Bismol or Kaopectate or Imodium; if not okay 1st of week let us know.

## 2013-01-14 NOTE — Telephone Encounter (Addendum)
LMTCBx1. Carron Curie, CMA  per phonen ote from earlier today CY recs were: Per CY-stop Amoxicillin; ok to finish prednisone, ok to use Pepto Bismol or Kaopectate or Imodium; if not okay 1st of week let us know.  Pt was advised. LMTCB to see what else pt may need. Carron Curie, CMA

## 2013-01-15 NOTE — Telephone Encounter (Signed)
LMTCBx2. Jennifer Castillo, CMA  

## 2013-01-15 NOTE — Telephone Encounter (Signed)
Returning call.

## 2013-01-15 NOTE — Telephone Encounter (Signed)
Pt again advised that he is to stop the abx and CY did not rec another abx because of his stomach upset. Another abx would only make this worse. Carron Curie, CMA

## 2013-04-07 ENCOUNTER — Encounter: Payer: Self-pay | Admitting: Neurology

## 2013-04-12 DIAGNOSIS — M62838 Other muscle spasm: Secondary | ICD-10-CM | POA: Diagnosis not present

## 2013-04-12 DIAGNOSIS — M1990 Unspecified osteoarthritis, unspecified site: Secondary | ICD-10-CM | POA: Diagnosis not present

## 2013-04-30 ENCOUNTER — Encounter: Payer: Self-pay | Admitting: Neurology

## 2013-05-12 DIAGNOSIS — M542 Cervicalgia: Secondary | ICD-10-CM | POA: Diagnosis not present

## 2013-05-12 DIAGNOSIS — G40309 Generalized idiopathic epilepsy and epileptic syndromes, not intractable, without status epilepticus: Secondary | ICD-10-CM | POA: Diagnosis not present

## 2013-05-12 DIAGNOSIS — Z1331 Encounter for screening for depression: Secondary | ICD-10-CM | POA: Diagnosis not present

## 2013-05-12 DIAGNOSIS — I359 Nonrheumatic aortic valve disorder, unspecified: Secondary | ICD-10-CM | POA: Diagnosis not present

## 2013-05-12 DIAGNOSIS — IMO0002 Reserved for concepts with insufficient information to code with codable children: Secondary | ICD-10-CM | POA: Diagnosis not present

## 2013-05-24 ENCOUNTER — Telehealth: Payer: Self-pay | Admitting: Neurology

## 2013-05-24 NOTE — Telephone Encounter (Signed)
Pt has not been seen by Dr. Frances Furbish, has been cancelled 3 times, due to over 3 Dr. Sandria Manly pt's this patient needs to be reassigned per Dr. Frances Furbish

## 2013-05-26 ENCOUNTER — Other Ambulatory Visit: Payer: Self-pay

## 2013-05-26 ENCOUNTER — Encounter: Payer: Self-pay | Admitting: Neurology

## 2013-05-26 NOTE — Telephone Encounter (Signed)
Assigned to Dr. Vickey Huger.

## 2013-05-27 ENCOUNTER — Ambulatory Visit (INDEPENDENT_AMBULATORY_CARE_PROVIDER_SITE_OTHER): Payer: Medicare Other | Admitting: Neurology

## 2013-05-27 ENCOUNTER — Encounter: Payer: Self-pay | Admitting: Neurology

## 2013-05-27 VITALS — BP 86/50 | HR 70 | Ht 67.0 in | Wt 148.0 lb

## 2013-05-27 DIAGNOSIS — G40209 Localization-related (focal) (partial) symptomatic epilepsy and epileptic syndromes with complex partial seizures, not intractable, without status epilepticus: Secondary | ICD-10-CM | POA: Diagnosis not present

## 2013-05-27 DIAGNOSIS — R413 Other amnesia: Secondary | ICD-10-CM

## 2013-05-27 NOTE — Patient Instructions (Addendum)
I think overall you are doing fairly well but I do want to suggest a few things today:  Remember to drink plenty of fluid, eat healthy meals and do not skip any meals. Try to eat protein with a every meal and eat a healthy snack such as fruit or nuts in between meals. Try to keep a regular sleep-wake schedule and try to exercise daily, particularly in the form of walking, 20-30 minutes a day, if you can.   Engage in social activities in your community and with your family and try to keep up with current events by reading the newspaper or watching the news.   As far as your medications are concerned, I would like to suggest no changes.     As far as diagnostic testing: No new test.   I suggest that you see one of my associates in about 6 months for routine follow up. I will have our nurse, Alverda Skeans, call you for your appointment. Please call in the interim, if you have any questions or concerns.   Our phone number is [phone]. We also have an after hours call service for urgent matters and there is a physician on-call for urgent questions. For any emergencies you know to call 911 or go to the nearest emergency room.

## 2013-05-27 NOTE — Progress Notes (Signed)
Subjective:    Patient ID: [name] is a 79 y.o. male.  HPI  Interim history:   [name] is a very pleasant 69 year old right-handed gentleman who presents for followup consultation of his complex partial seizure disorder. He is accompanied by his wife today. This is his first visit after Dr. Imagene Gurney retirement and he was last seen by Dr. Fayrene Fearing love on 12/02/2012, at which time Dr. Sandria Manly did not change his antiseizure medication and felt hesitant to start donepezil for memory loss as it can lower seizure threshold. He suggested brain MRI and lab work including Keppra level. His MMSE at that time was 27/30, clock drawing was 4, animal fluency was 9. The patient has an underlying medical history of asthma, esophagitis, thyroid disease, arthritis status post right hip replacement surgery in 2005 and seizure disorder. He is currently on pravastatin, B12, axona, omeprazole, Synthroid, Advair, Keppra, vitamin B6, multivitamin.  I reviewed Dr. Imagene Gurney last notes and the patient's records and blows his summary that revealed:  79 year old gentleman with a history of partial complex seizures diagnosed in 1983 with an abnormal EEGs showing left temporal epileptiform activity. He had side effects on Dilantin. MRI brain and head CT were normal at the time. Dilantin was discontinued in 1986. He was then changed to Tegretol but had side effects including sedation. He was tried on Depakote but was tapered off at [facility]. He had are as in 2006 and started having memory loss. MRI in August 2006 was normal except for mild atrophy. EEG in December 2006 was normal. He could not tolerate Lamictal. In November 2007 he had a witnessed seizure. He was eventually changed to Keppra but had daytime somnolence.  He had a repeat MRI brain on 12/12/12 showed mild generalized atrophy, no significant changes from 01/09/13. He has had no recent Sz or auras, but reports seeing flashing lights  sometimes. He is due for an eye exam.   His Past Medical History Is Significant For: Past Medical History  Diagnosis Date  . Seizure disorder   . Esophageal reflux   . Allergic rhinitis   . Asthma   . Other and unspecified hyperlipidemia   . Memory loss   . Unspecified hypothyroidism   . Dizziness and giddiness   . Depressive disorder, not elsewhere classified   . Localization-related (focal) (partial) epilepsy and epileptic syndromes with complex partial seizures, with intractable epilepsy     His Past Surgical History Is Significant For: Past Surgical History  Procedure Laterality Date  . Appendectomy    . Right hip replacement  2005    His Family History Is Significant For: Family History  Problem Relation Age of Onset  . Heart disease Mother   . Heart disease Father   . Asthma Sister   . Memory loss Mother     His Social History Is Significant For: History   Social History  . Marital Status: Married    Spouse Name: N/A    Number of Children: 2  . Years of Education: N/A   Occupational History  . retired Technical sales engineer    Social History Main Topics  . Smoking status: Never Smoker   . Smokeless tobacco: Never Used  . Alcohol Use: None  . Drug Use: None  . Sexually Active: None   Other Topics Concern  . None   Social History Narrative  . None    His Allergies Are:  Allergies  Allergen Reactions  . Sulfonamide Derivatives   :  His Current Medications Are:  Outpatient Encounter Prescriptions as of 05/27/2013  Medication Sig Dispense Refill  . acetaminophen (TYLENOL) 500 MG tablet Take 500 mg by mouth every 6 (six) hours as needed for pain.      . Dietary Management Product (AXONA) packet Take 40 g by mouth daily.        . Fluticasone-Salmeterol (ADVAIR DISKUS) 250-50 MCG/DOSE AEPB Inhale 1 puff into the lungs 2 (two) times daily.  60 each  5  . levETIRAcetam (KEPPRA) 500 MG tablet 2 daily       . levothyroxine (SYNTHROID, LEVOTHROID) 125 MCG tablet  Take 125 mcg by mouth daily.        . Multiple Vitamin (MULTIVITAMIN) tablet Take 1 tablet by mouth daily.        Marland Kitchen omeprazole (PRILOSEC) 20 MG capsule Take 20 mg by mouth daily.        . pravastatin (PRAVACHOL) 20 MG tablet Take 1 tablet by mouth daily.      . Pyridoxine HCl (VITAMIN B-6) 500 MG tablet Take 500 mg by mouth daily.        . vitamin B-12 (CYANOCOBALAMIN) 1000 MCG tablet Take 1,000 mcg by mouth daily.        . [DISCONTINUED] albuterol (PROVENTIL HFA;VENTOLIN HFA) 108 (90 BASE) MCG/ACT inhaler Inhale 2 puffs into the lungs every 6 (six) hours as needed for wheezing.  1 Inhaler  3  . [DISCONTINUED] amoxicillin-clavulanate (AUGMENTIN) 875-125 MG per tablet Take 1 tablet by mouth 2 (two) times daily.  14 tablet  0  . [DISCONTINUED] benzonatate (TESSALON) 100 MG capsule Take 100 mg by mouth 3 (three) times daily as needed for cough.      . [DISCONTINUED] finasteride (PROSCAR) 5 MG tablet Take 1 tablet by mouth daily.      . [DISCONTINUED] meloxicam (MOBIC) 15 MG tablet       . [DISCONTINUED] predniSONE (DELTASONE) 5 MG tablet Take 1 tablet (5 mg total) by mouth daily.  30 tablet  0   No facility-administered encounter medications on file as of 05/27/2013.  :  Review of Systems  Cardiovascular: Positive for leg swelling.  Neurological:       Memory loss    Objective:  Neurologic Exam  Physical Exam Physical Examination:   Filed Vitals:   05/27/13 0934  BP: 86/50  Pulse: 70    General Examination: The patient is a very pleasant 79 y.o. male in no acute distress. He is calm and cooperative with the exam. He denies Auditory Hallucinations and Visual Hallucinations.   HEENT: Normocephalic, atraumatic, pupils are equal, round and reactive to light and accommodation. Funduscopic exam is normal with sharp disc margins noted. Extraocular tracking shows mild saccadic breakdown without nystagmus noted. Hearing is intact. Tympanic membranes are clear bilaterally. Face is symmetric with  no facial masking and normal facial sensation. There is no lip, neck or jaw tremor. Neck is not rigid with intact passive ROM. There are no carotid bruits on auscultation. Oropharynx exam reveals mild mouth dryness. No significant airway crowding is noted. Mallampati is class II. Tongue protrudes centrally and palate elevates symmetrically.    Chest: is clear to auscultation without wheezing, rhonchi or crackles noted.  Heart: sounds are regular and normal without murmurs, rubs or gallops noted.   Abdomen: is soft, non-tender and non-distended with normal bowel sounds appreciated on auscultation.  Extremities: There is no pitting edema in the distal lower extremities bilaterally. Pedal pulses are intact.  Skin: is warm and dry  with no trophic changes noted.  Musculoskeletal: exam reveals no obvious joint deformities, tenderness or joint swelling or erythema.  Neurologically:  Mental status: The patient is awake and alert, paying good  attention. He is able to completely provide the history. His wife provides some details. He is oriented to: person, place, situation, day of week, month of year and year. His memory, attention, language and knowledge are impaired mildly. There is no aphasia, agnosia, apraxia or anomia. There is a no significant degree of bradyphrenia. Speech is mildly hypophonic with no dysarthria noted. Mood is congruent and affect is normal.  His MMSE score is 27/30. CDT is 4/4. AFT (Animal Fluency Test) score is 10.  Cranial nerves are as described above under HEENT exam. In addition, shoulder shrug is normal with equal shoulder height noted.  Motor exam: Normal bulk, and strength for age is noted. Tone is not rigid with absence of cogwheeling in the extremities. There is overall no significant bradykinesia. There is no drift or rebound. There is no tremor.   Romberg is negative. Reflexes are 1+ in the upper extremities and 1+ in the lower extremities. Toes are downgoing  bilaterally. Fine motor skills: Finger taps, hand movements, and rapid alternating patting are mildly impaired bilaterally. Foot taps and foot agility are mildly impaired bilaterally.   Cerebellar testing shows no dysmetria or intention tremor on finger to nose testing. Heel to shin is unremarkable. There is no truncal or gait ataxia.   Sensory exam is intact to light touch.   Gait, station and balance: He stands up from the seated position with no significant difficulty. No veering to one side is noted. No leaning to one side. Posture is mildly stooped. Stance is narrow-based. He turns in 3 steps. Tandem walk is not possible. Balance is mildly impaired.   Assessment and Plan:   Assessment and Plan:  In summary, [name] is a very pleasant 40 y.o.-year old male with a history of complex partial seizure disorder and memory loss. He has remained stable. His MMSE is stable. I suggested continuing with his medication which includes Axona, which she has reduced to 3 times a week due to cost primarily. Down the road he may be a candidate for Namenda. I would hold off on Aricept as it can lower the seizure threshold. He is stable on long-acting low-dose Keppra. He did not require a refill today. I suggested a followup with one of my associates in 6 months, sooner if the need arises. I will have our nurse, Alverda Skeans, call him for his appointment.  I had a long chat with the patient and his wife about my findings and the diagnosis of memory loss and dementia, its prognosis and treatment options. We talked about medical treatments and non-pharmacological approaches. We talked about maintaining a healthy lifestyle in general and staying active mentally and physically. I encouraged the patient to eat healthy, exercise daily and keep well hydrated, to keep a scheduled bedtime and wake time routine, to not skip any meals and eat healthy snacks in between meals and to have protein with every meal. I stressed the  importance of regular exercise, within of course the patient's own mobility limitations. I encouraged the patient to keep up with current events by reading the news paper or watching the news.   As far as further diagnostic testing is concerned, I suggested the following: no change.  As far as medications are concerned, I recommended the following at this time: no change.  I answered all their questions today and the patient and his wife were in agreement with the above outlined plan.

## 2013-06-23 ENCOUNTER — Other Ambulatory Visit: Payer: Self-pay | Admitting: Internal Medicine

## 2013-06-23 MED ORDER — FLUTICASONE-SALMETEROL 250-50 MCG/DOSE IN AEPB: 1.0000 | INHALATION_SPRAY | Freq: Two times a day (BID) | RESPIRATORY_TRACT | Status: DC

## 2013-07-08 DIAGNOSIS — Z23 Encounter for immunization: Secondary | ICD-10-CM | POA: Diagnosis not present

## 2013-07-12 ENCOUNTER — Other Ambulatory Visit: Payer: Self-pay | Admitting: Neurology

## 2013-08-26 ENCOUNTER — Other Ambulatory Visit: Payer: Self-pay

## 2013-09-13 DIAGNOSIS — H52209 Unspecified astigmatism, unspecified eye: Secondary | ICD-10-CM | POA: Diagnosis not present

## 2013-09-13 DIAGNOSIS — H5200 Hypermetropia, unspecified eye: Secondary | ICD-10-CM | POA: Diagnosis not present

## 2013-09-13 DIAGNOSIS — H2510 Age-related nuclear cataract, unspecified eye: Secondary | ICD-10-CM | POA: Diagnosis not present

## 2013-09-28 DIAGNOSIS — N400 Enlarged prostate without lower urinary tract symptoms: Secondary | ICD-10-CM | POA: Diagnosis not present

## 2013-10-18 DIAGNOSIS — M545 Low back pain, unspecified: Secondary | ICD-10-CM | POA: Diagnosis not present

## 2013-10-18 DIAGNOSIS — IMO0002 Reserved for concepts with insufficient information to code with codable children: Secondary | ICD-10-CM | POA: Diagnosis not present

## 2013-11-09 DIAGNOSIS — R413 Other amnesia: Secondary | ICD-10-CM | POA: Diagnosis not present

## 2013-11-10 DIAGNOSIS — R413 Other amnesia: Secondary | ICD-10-CM | POA: Diagnosis not present

## 2013-11-18 DIAGNOSIS — R413 Other amnesia: Secondary | ICD-10-CM | POA: Diagnosis not present

## 2013-11-25 DIAGNOSIS — E785 Hyperlipidemia, unspecified: Secondary | ICD-10-CM | POA: Diagnosis not present

## 2013-11-25 DIAGNOSIS — R5383 Other fatigue: Secondary | ICD-10-CM | POA: Diagnosis not present

## 2013-11-25 DIAGNOSIS — R413 Other amnesia: Secondary | ICD-10-CM | POA: Diagnosis not present

## 2013-11-25 DIAGNOSIS — R5381 Other malaise: Secondary | ICD-10-CM | POA: Diagnosis not present

## 2013-11-25 DIAGNOSIS — IMO0002 Reserved for concepts with insufficient information to code with codable children: Secondary | ICD-10-CM | POA: Diagnosis not present

## 2013-11-25 DIAGNOSIS — E039 Hypothyroidism, unspecified: Secondary | ICD-10-CM | POA: Diagnosis not present

## 2013-11-25 DIAGNOSIS — F068 Other specified mental disorders due to known physiological condition: Secondary | ICD-10-CM | POA: Diagnosis not present

## 2013-11-25 DIAGNOSIS — I359 Nonrheumatic aortic valve disorder, unspecified: Secondary | ICD-10-CM | POA: Diagnosis not present

## 2013-12-24 ENCOUNTER — Ambulatory Visit: Payer: Medicare Other | Admitting: Internal Medicine

## 2013-12-28 DIAGNOSIS — F068 Other specified mental disorders due to known physiological condition: Secondary | ICD-10-CM | POA: Diagnosis not present

## 2013-12-28 DIAGNOSIS — E039 Hypothyroidism, unspecified: Secondary | ICD-10-CM | POA: Diagnosis not present

## 2013-12-28 DIAGNOSIS — IMO0002 Reserved for concepts with insufficient information to code with codable children: Secondary | ICD-10-CM | POA: Diagnosis not present

## 2013-12-28 DIAGNOSIS — G40309 Generalized idiopathic epilepsy and epileptic syndromes, not intractable, without status epilepticus: Secondary | ICD-10-CM | POA: Diagnosis not present

## 2014-01-03 DIAGNOSIS — Z125 Encounter for screening for malignant neoplasm of prostate: Secondary | ICD-10-CM | POA: Diagnosis not present

## 2014-01-03 DIAGNOSIS — E039 Hypothyroidism, unspecified: Secondary | ICD-10-CM | POA: Diagnosis not present

## 2014-01-03 DIAGNOSIS — E785 Hyperlipidemia, unspecified: Secondary | ICD-10-CM | POA: Diagnosis not present

## 2014-01-03 DIAGNOSIS — Z79899 Other long term (current) drug therapy: Secondary | ICD-10-CM | POA: Diagnosis not present

## 2014-01-10 DIAGNOSIS — Z0000 Encounter for general adult medical examination without abnormal findings: Secondary | ICD-10-CM | POA: Diagnosis not present

## 2014-01-10 DIAGNOSIS — G40309 Generalized idiopathic epilepsy and epileptic syndromes, not intractable, without status epilepticus: Secondary | ICD-10-CM | POA: Diagnosis not present

## 2014-01-10 DIAGNOSIS — F068 Other specified mental disorders due to known physiological condition: Secondary | ICD-10-CM | POA: Diagnosis not present

## 2014-01-10 DIAGNOSIS — I359 Nonrheumatic aortic valve disorder, unspecified: Secondary | ICD-10-CM | POA: Diagnosis not present

## 2014-01-10 DIAGNOSIS — M545 Low back pain, unspecified: Secondary | ICD-10-CM | POA: Diagnosis not present

## 2014-01-10 DIAGNOSIS — K2270 Barrett's esophagus without dysplasia: Secondary | ICD-10-CM | POA: Diagnosis not present

## 2014-01-10 DIAGNOSIS — E785 Hyperlipidemia, unspecified: Secondary | ICD-10-CM | POA: Diagnosis not present

## 2014-01-10 DIAGNOSIS — Z1331 Encounter for screening for depression: Secondary | ICD-10-CM | POA: Diagnosis not present

## 2014-01-10 DIAGNOSIS — E039 Hypothyroidism, unspecified: Secondary | ICD-10-CM | POA: Diagnosis not present

## 2014-01-12 DIAGNOSIS — Z1212 Encounter for screening for malignant neoplasm of rectum: Secondary | ICD-10-CM | POA: Diagnosis not present

## 2014-01-17 ENCOUNTER — Other Ambulatory Visit: Payer: Self-pay | Admitting: Neurology

## 2014-01-31 ENCOUNTER — Telehealth: Payer: Self-pay | Admitting: Neurology

## 2014-01-31 NOTE — Telephone Encounter (Signed)
Allie from [facility] calling to state that patient's Keppra has been approved starting on 01/28/14 and it is good for 1 year. They are also faxing approval letters. If questions, please call.

## 2014-02-01 DIAGNOSIS — Z111 Encounter for screening for respiratory tuberculosis: Secondary | ICD-10-CM | POA: Diagnosis not present

## 2014-02-03 ENCOUNTER — Ambulatory Visit: Payer: Self-pay | Admitting: Diagnostic Neuroimaging

## 2014-02-21 ENCOUNTER — Other Ambulatory Visit: Payer: Self-pay | Admitting: Neurology

## 2014-03-31 ENCOUNTER — Telehealth: Payer: Self-pay

## 2014-03-31 NOTE — Telephone Encounter (Signed)
Called patient to offer earlier appt (on wait list) for today. Pt said he has a meeting this evening unable to commit. Advised will remain on wait list. Pt agreed.

## 2014-04-07 DIAGNOSIS — K2270 Barrett's esophagus without dysplasia: Secondary | ICD-10-CM | POA: Insufficient documentation

## 2014-04-07 DIAGNOSIS — Z860100 Personal history of colon polyps, unspecified: Secondary | ICD-10-CM | POA: Insufficient documentation

## 2014-04-07 DIAGNOSIS — E785 Hyperlipidemia, unspecified: Secondary | ICD-10-CM | POA: Insufficient documentation

## 2014-04-07 DIAGNOSIS — J449 Chronic obstructive pulmonary disease, unspecified: Secondary | ICD-10-CM | POA: Insufficient documentation

## 2014-04-07 DIAGNOSIS — F0390 Unspecified dementia without behavioral disturbance: Secondary | ICD-10-CM | POA: Insufficient documentation

## 2014-04-21 ENCOUNTER — Encounter: Payer: Self-pay | Admitting: Diagnostic Neuroimaging

## 2014-04-21 ENCOUNTER — Ambulatory Visit (INDEPENDENT_AMBULATORY_CARE_PROVIDER_SITE_OTHER): Payer: Medicare Other | Admitting: Diagnostic Neuroimaging

## 2014-04-21 VITALS — BP 94/53 | HR 63 | Temp 97.0°F | Ht 67.0 in | Wt 143.0 lb

## 2014-04-21 DIAGNOSIS — G40209 Localization-related (focal) (partial) symptomatic epilepsy and epileptic syndromes with complex partial seizures, not intractable, without status epilepticus: Secondary | ICD-10-CM

## 2014-04-21 DIAGNOSIS — R413 Other amnesia: Secondary | ICD-10-CM

## 2014-04-21 MED ORDER — LEVETIRACETAM ER 750 MG PO TB24: 750.0000 mg | ORAL_TABLET | Freq: Every day | ORAL | Status: DC

## 2014-04-21 NOTE — Progress Notes (Signed)
[facility]  PATIENT: [name] DOB: 1934-05-22  REFERRING CLINICIAN:  HISTORY FROM: patient  REASON FOR VISIT: follow up   HISTORICAL  CHIEF COMPLAINT:  Chief Complaint  Patient presents with  . Follow-up    epilepsy, memory    HISTORY OF PRESENT ILLNESS:   UPDATE 04/21/14: Since last visit, was doing well until 12/29/13, at coffee shop with friends, then had a 1 min episode of staring/memory loss. No convulsions or syncope. He thought this may have been a "mini sz" but he didn't contact our office. He contacted PCP, and was recommended to add LEV 250mg  in AM, in addition to his 500mg  XR tabs in the evening. Memory loss is stable. Still driving. Recently transitioned to retirement community.   UPDATE 05/27/13 (Dr. Rexene Alberts): 80 year old right-handed gentleman who presents for followup consultation of his complex partial seizure disorder. He is accompanied by his wife today. This is his first visit after Dr. Tressia Danas retirement and he was last seen by Dr. Jeneen Rinks love on 12/02/2012, at which time Dr. Erling Cruz did not change his antiseizure medication and felt hesitant to start donepezil for memory loss as it can lower seizure threshold. He suggested brain MRI and lab work including [place] level. His MMSE at that time was 27/30, clock drawing was 4, animal fluency was 9. The patient has an underlying medical history of asthma, esophagitis, thyroid disease, arthritis status post right hip replacement surgery in 2005 and seizure disorder. He is currently on pravastatin, B12, axona, omeprazole, Synthroid, Advair, Keppra, vitamin B6, multivitamin.  PRIOR HPI (12/02/12, Dr. Erling Cruz): 80 year old right-handed white married male with complex partial seizures first diagnosed in 1983 with normal CAT scan and MRI study of the brain but two EEGs showing evidence of left temporal lobe epileptiform activity. He developed side effects on Dilantin which was discontinued in 1986. He  had 2 simple  partial seizures and Dilantin was reinstituted. Dilantin was changed to Tegretol but he developed lethargy on Tegretol and was tapered off.He had a seizure 05/10/1997 and Tegretol was restarted.He was tried of Depakote, but was tapered off Depakote at [facility]. In 2006 he began having episodes of strange feeling lasting 20-30 seconds with dj vu and memory loss. During these episodes he would be unresponsive according his wife who would shake him. MRI 06/05/2005 was normal except for mild atrophy and EEG 10/11/2005 was normal. He was placed on Lamictal but did not tolerate this well and continued to have seizures on 300 mg per day. He had a witnessed seizure 08/26/2006 in [place] office. He was slowly changed to [place] and has not had recurrent seizures but has had poor tolerance with sleepiness. He was changed from 500 mg twice per day to 500 mg ER twice daily and subsequently 250 mg levetiracetam 3 times per day. He is now on levetiracetam 500 mg ER once daily . I saw him 06/19/09 for episodes of vague sensation with spinning lasting 20-30 seconds. These would occur sitting, lying, standing, and when turning over in bed. There was no loss of consciousness or aura of macropsia, micropsia, dj vu, strange odors or  tastes. His examination was normal with negative Dix-Hallpike hanging head test. He started  vertigo exercises and his symptoms resolved.  If he does not do his exercises the symptoms recur. He complains of memory loss He has been evaluated 06/2007 for memory loss with a neuropsychological battery showing superior intelligence, but loss of short-term and delayed memory. His mother had memory loss beginning  in her 56s. He is able to pay the bills and right the checks.He can't  remember what day it is. He has difficulty with peoples names that he has known for years. He takes naps for 15 minutes after breakfast, and one to 2 naps after lunch. He drinks coffee during the day, small amounts  because of  his Barrett's esophagitis which he states helps his memory. He does not snore at night. He awakens refreshed in the mornings. He is independent in activities of daily. 12/03/2011=(MMSE 26/30. CDT 4/4. AFT 12. [name] Index of independence in activities of daily living 6. Lawton-Brody instrumental activities of daily living scale 7. Neuropsychiatric inventory=0. Geriatric depression scale 2/15. Falls assessment tool score 5.CBC, CMP,  lipid profile, and TSH normal 11/13/11. He has not had recurrent seizures. He denies macropsia, micropsia, strange odors or tastes.He complains of daytime sleepiness and takeslevetiracetam 500 mg ER once at night. He complains of worsening long term memory, particularly noticed after gathering of classmates from his college days. He is performing [email or web address] and an exercise program with  improvement  in his memory He exercises by doing calisthenics 5 times per week, but  injured his right ankle many years ago when he fell off of a tank and has recurrent right ankle pain, uses a brace. 12/02/2012=( MMSE 27/30. CDT 4/4. AFT 9. [name] 6. Lawton-Brody 7. Neuropsychiatric inventory=0.  Geriatric depression scale 1/15 Falls assessment tool score 5).  REVIEW OF SYSTEMS: Full 14 system review of systems performed and notable only for palpitations hearing loss constipation.  ALLERGIES: Allergies  Allergen Reactions  . Sulfonamide Derivatives     HOME MEDICATIONS: Outpatient Prescriptions Prior to Visit  Medication Sig Dispense Refill  . levothyroxine (SYNTHROID, LEVOTHROID) 125 MCG tablet Take 125 mcg by mouth daily.        . Multiple Vitamin (MULTIVITAMIN) tablet Take 1 tablet by mouth daily.        Marland Kitchen omeprazole (PRILOSEC) 20 MG capsule Take 20 mg by mouth daily.        . pravastatin (PRAVACHOL) 20 MG tablet Take 1 tablet by mouth daily.      . Pyridoxine HCl (VITAMIN B-6) 500 MG tablet Take 500 mg by mouth daily.        . vitamin B-12 (CYANOCOBALAMIN) 1000 MCG tablet Take 1,000 mcg  by mouth daily.        Marland Kitchen levETIRAcetam (KEPPRA XR) 500 MG 24 hr tablet TAKE 1 TABLET AT NIGHT  90 tablet  0  . acetaminophen (TYLENOL) 500 MG tablet Take 500 mg by mouth every 6 (six) hours as needed for pain.      . Dietary Management Product (AXONA) packet Take 40 g by mouth daily.        . Fluticasone-Salmeterol (ADVAIR DISKUS) 250-50 MCG/DOSE AEPB Inhale 1 puff into the lungs 2 (two) times daily.  60 each  5   No facility-administered medications prior to visit.    PAST MEDICAL HISTORY: Past Medical History  Diagnosis Date  . Seizure disorder   . Esophageal reflux   . Allergic rhinitis   . Asthma   . Other and unspecified hyperlipidemia   . Memory loss   . Unspecified hypothyroidism   . Dizziness and giddiness   . Depressive disorder, not elsewhere classified   . 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 Appendectomy    .  Right hip replacement  2005    FAMILY HISTORY: Family History  Problem Relation Age of Onset  . Heart disease Mother   . Heart disease Father   . Asthma Sister   . Memory loss Mother     SOCIAL HISTORY:  History   Social History  . Marital Status: Married    Spouse Name: Eugenia    Number of Children: 2  . Years of Education: BA   Occupational History  . retired Arboriculturist   .     Social History Main Topics  . Smoking status: Never Smoker   . Smokeless tobacco: Never Used  . Alcohol Use: Not on file  . Drug Use: Not on file  . Sexual Activity: Not on file   Other Topics Concern  . Not on file   Social History Narrative   Patient lives at home with family. ([place])   Caffeine Use: 2 cups daily     PHYSICAL EXAM  Filed Vitals:   04/21/14 1507  BP: 94/53  Pulse: 63  Temp: 97 F (36.1 C)  TempSrc: Oral  Height: 5\' 7"  (1.702 m)  Weight: 143 lb (64.864  kg)    Not recorded    Body mass index is 22.39 kg/(m^2).  GENERAL EXAM: Patient is in no distress; well developed, nourished and groomed; neck is supple  CARDIOVASCULAR: Regular rate and rhythm, no murmurs, no carotid bruits  NEUROLOGIC: MENTAL STATUS: awake, alert, language fluent, comprehension intact, naming intact, fund of knowledge appropriate; MMSE 26/30 (MISSES 1 ON SERIAL 7, 2 ON RECALL, 1 ON INSTRUCTIONS). GDS 3. CRANIAL NERVE: pupils equal and reactive to light, visual fields full to confrontation, extraocular muscles intact, no nystagmus, facial sensation and strength symmetric, hearing intact, palate elevates symmetrically, uvula midline, shoulder shrug symmetric, tongue midline. MOTOR: normal bulk and tone, full strength in the BUE, BLE SENSORY: normal and symmetric to light touch COORDINATION: finger-nose-finger, fine finger movements normal REFLEXES: deep tendon reflexes present and symmetric GAIT/STATION: SLOW, LIMPING GAIT. STOOPED POSTURE.    DIAGNOSTIC DATA (LABS, IMAGING, TESTING) - I reviewed patient records, labs, notes, testing and imaging myself where available.  No results found for this basename: WBC, HGB, HCT, MCV, PLT   No results found for this basename: na, k, cl, co2, glucose, bun, creatinine, calcium, prot, albumin, ast, alt, alkphos, bilitot, gfrnonaa, gfraa   No results found for this basename: CHOL, HDL, LDLCALC, LDLDIRECT, TRIG, CHOLHDL   No results found for this basename: HGBA1C   No results found for this basename: VITAMINB12   No results found for this basename: TSH    12/12/12 MRI brain - Mild generalized cerebral atrophy. No significant changes compared with prior MRI scan dated 01/09/2010.  09/25/05 EEG - normal   ASSESSMENT AND PLAN  80 y.o. year old male here with complex partial seizures and memory loss. May have had a seizure vs memory lapse spell in March 2015. Now on slightly higher LEV and doing well. Will change pill to  LEV XR 750mg  qhs for convenience. Monitor symptoms. Memory loss stable. Caution with driving.  PLAN:  Meds ordered this encounter  Medications  . DISCONTD: levETIRAcetam (KEPPRA) 250 MG tablet    Sig: Take 250 mg by mouth every morning.  . levETIRAcetam 750 MG TB24    Sig: Take 1 tablet (750 mg total) by mouth at bedtime.    Dispense:  90 tablet    Refill:  4    Return in about 6 months (around 10/22/2014).  Penni Bombard, MD 02/25/3093, 0:76 PM Certified in Neurology, Neurophysiology and Neuroimaging  [facility] [address] [place], [place] [address] [phone]

## 2014-04-21 NOTE — Patient Instructions (Signed)
Change levetiracetam to XR 750mg  at nighttime.  Caution with driving.

## 2014-05-24 ENCOUNTER — Other Ambulatory Visit: Payer: Self-pay | Admitting: Neurology

## 2014-05-24 NOTE — Telephone Encounter (Signed)
Last OV says: Will change pill to LEV XR 750mg  qhs for convenience

## 2014-05-25 ENCOUNTER — Telehealth: Payer: Self-pay | Admitting: Diagnostic Neuroimaging

## 2014-05-25 NOTE — Telephone Encounter (Signed)
Patient questioning why dosage for Rx levETIRAcetam 750 MG TB24 was changed.  Please call and advise.  Thanks

## 2014-05-25 NOTE — Telephone Encounter (Signed)
Last OV note says: He contacted PCP, and was recommended to add LEV 250mg  in AM, in addition to his 500mg  XR tabs in the evening.  Will change pill to LEV XR 750mg  qhs for convenience I called back.  Explained notes.  He verbalized understanding and was agreeable to this plan.

## 2014-06-30 ENCOUNTER — Other Ambulatory Visit: Payer: Self-pay | Admitting: Gastroenterology

## 2014-06-30 DIAGNOSIS — K2270 Barrett's esophagus without dysplasia: Secondary | ICD-10-CM | POA: Diagnosis not present

## 2014-06-30 DIAGNOSIS — D131 Benign neoplasm of stomach: Secondary | ICD-10-CM | POA: Diagnosis not present

## 2014-06-30 DIAGNOSIS — K449 Diaphragmatic hernia without obstruction or gangrene: Secondary | ICD-10-CM | POA: Diagnosis not present

## 2014-08-08 DIAGNOSIS — Z23 Encounter for immunization: Secondary | ICD-10-CM | POA: Diagnosis not present

## 2014-09-27 DIAGNOSIS — N400 Enlarged prostate without lower urinary tract symptoms: Secondary | ICD-10-CM | POA: Diagnosis not present

## 2014-09-30 DIAGNOSIS — N400 Enlarged prostate without lower urinary tract symptoms: Secondary | ICD-10-CM | POA: Diagnosis not present

## 2014-10-24 ENCOUNTER — Encounter: Payer: Self-pay | Admitting: Diagnostic Neuroimaging

## 2014-10-24 ENCOUNTER — Ambulatory Visit (INDEPENDENT_AMBULATORY_CARE_PROVIDER_SITE_OTHER): Payer: Medicare Other | Admitting: Diagnostic Neuroimaging

## 2014-10-24 VITALS — BP 96/53 | HR 63 | Temp 96.8°F | Ht 67.0 in | Wt 151.2 lb

## 2014-10-24 DIAGNOSIS — G40209 Localization-related (focal) (partial) symptomatic epilepsy and epileptic syndromes with complex partial seizures, not intractable, without status epilepticus: Secondary | ICD-10-CM

## 2014-10-24 DIAGNOSIS — R413 Other amnesia: Secondary | ICD-10-CM

## 2014-10-24 MED ORDER — LEVETIRACETAM ER 750 MG PO TB24: 750.0000 mg | ORAL_TABLET | Freq: Every day | ORAL | Status: DC

## 2014-10-24 NOTE — Patient Instructions (Signed)
Continue current medications. 

## 2014-10-24 NOTE — Progress Notes (Signed)
[facility]  PATIENT: TANIA PERROTT DOB: 1934-10-10  REFERRING CLINICIAN:  HISTORY FROM: patient and wife REASON FOR VISIT: follow up   HISTORICAL  CHIEF COMPLAINT:  Chief Complaint  Patient presents with  . Follow-up    HISTORY OF PRESENT ILLNESS:   UPDATE 10/24/14: Since last visit, doing well. No sz. No spells. Memory loss is stable.   UPDATE 04/21/14: Since last visit, was doing well until 12/29/13, at coffee shop with friends, then had a 1 min episode of staring/memory loss. No convulsions or syncope. He thought this may have been a "mini sz" but he didn't contact our office. He contacted PCP, and was recommended to add LEV 250mg  in AM, in addition to his 500mg  XR tabs in the evening. Memory loss is stable. Still driving. Recently transitioned to retirement community.   UPDATE 05/27/13 (Dr. Rexene Alberts): 81 year old right-handed gentleman who presents for followup consultation of his complex partial seizure disorder. He is accompanied by his wife today. This is his first visit after Dr. Tressia Danas retirement and he was last seen by Dr. Jeneen Rinks love on 12/02/2012, at which time Dr. Erling Cruz did not change his antiseizure medication and felt hesitant to start donepezil for memory loss as it can lower seizure threshold. He suggested brain MRI and lab work including [place] level. His MMSE at that time was 27/30, clock drawing was 4, animal fluency was 9. The patient has an underlying medical history of asthma, esophagitis, thyroid disease, arthritis status post right hip replacement surgery in 2005 and seizure disorder. He is currently on pravastatin, B12, axona, omeprazole, Synthroid, Advair, Keppra, vitamin B6, multivitamin.  PRIOR HPI (12/02/12, Dr. Erling Cruz): 81 year old right-handed white married male with complex partial seizures first diagnosed in 1983 with normal CAT scan and MRI study of the brain but two EEGs showing evidence of left temporal lobe epileptiform activity. He developed  side effects on Dilantin which was discontinued in 1986. He  had 2 simple partial seizures and Dilantin was reinstituted. Dilantin was changed to Tegretol but he developed lethargy on Tegretol and was tapered off.He had a seizure 05/10/1997 and Tegretol was restarted.He was tried of Depakote, but was tapered off Depakote at [facility]. In 2006 he began having episodes of strange feeling lasting 20-30 seconds with dj vu and memory loss. During these episodes he would be unresponsive according his wife who would shake him. MRI 06/05/2005 was normal except for mild atrophy and EEG 10/11/2005 was normal. He was placed on Lamictal but did not tolerate this well and continued to have seizures on 300 mg per day. He had a witnessed seizure 08/26/2006 in [place] office. He was slowly changed to [place] and has not had recurrent seizures but has had poor tolerance with sleepiness. He was changed from 500 mg twice per day to 500 mg ER twice daily and subsequently 250 mg levetiracetam 3 times per day. He is now on levetiracetam 500 mg ER once daily . I saw him 06/19/09 for episodes of vague sensation with spinning lasting 20-30 seconds. These would occur sitting, lying, standing, and when turning over in bed. There was no loss of consciousness or aura of macropsia, micropsia, dj vu, strange odors or  tastes. His examination was normal with negative Dix-Hallpike hanging head test. He started  vertigo exercises and his symptoms resolved.  If he does not do his exercises the symptoms recur. He complains of memory loss He has been evaluated 06/2007 for memory loss with a neuropsychological battery showing superior intelligence,  but loss of short-term and delayed memory. His mother had memory loss beginning in her 19s. He is able to pay the bills and right the checks.He can't  remember what day it is. He has difficulty with peoples names that he has known for years. He takes naps for 15 minutes after breakfast, and one to 2 naps  after lunch. He drinks coffee during the day, small amounts  because of his Barrett's esophagitis which he states helps his memory. He does not snore at night. He awakens refreshed in the mornings. He is independent in activities of daily. 12/03/2011=(MMSE 26/30. CDT 4/4. AFT 12. [name] Index of independence in activities of daily living 6. Lawton-Brody instrumental activities of daily living scale 7. Neuropsychiatric inventory=0. Geriatric depression scale 2/15. Falls assessment tool score 5.CBC, CMP,  lipid profile, and TSH normal 11/13/11. He has not had recurrent seizures. He denies macropsia, micropsia, strange odors or tastes.He complains of daytime sleepiness and takeslevetiracetam 500 mg ER once at night. He complains of worsening long term memory, particularly noticed after gathering of classmates from his college days. He is performing [email or web address] and an exercise program with  improvement  in his memory He exercises by doing calisthenics 5 times per week, but  injured his right ankle many years ago when he fell off of a tank and has recurrent right ankle pain, uses a brace. 12/02/2012=( MMSE 27/30. CDT 4/4. AFT 9. [name] 6. Lawton-Brody 7. Neuropsychiatric inventory=0.  Geriatric depression scale 1/15 Falls assessment tool score 5).  REVIEW OF SYSTEMS: Full 14 system review of systems performed and notable only for memory loss joint pain hearing loss runny nose leg swelling.    ALLERGIES: Allergies  Allergen Reactions  . Lamotrigine Other (See Comments)    Drowsiness  . Sulfonamide Derivatives     HOME MEDICATIONS: Outpatient Prescriptions Prior to Visit  Medication Sig Dispense Refill  . acetaminophen (TYLENOL) 500 MG tablet Take 500 mg by mouth every 6 (six) hours as needed for pain.    Marland Kitchen levothyroxine (SYNTHROID, LEVOTHROID) 125 MCG tablet Take 125 mcg by mouth daily.      . Multiple Vitamin (MULTIVITAMIN) tablet Take 1 tablet by mouth daily.      Marland Kitchen omeprazole (PRILOSEC) 20 MG capsule Take  20 mg by mouth daily.      . pravastatin (PRAVACHOL) 20 MG tablet Take 1 tablet by mouth daily.    . Pyridoxine HCl (VITAMIN B-6) 500 MG tablet Take 500 mg by mouth daily.      . vitamin B-12 (CYANOCOBALAMIN) 1000 MCG tablet Take 1,000 mcg by mouth daily.      Marland Kitchen levETIRAcetam 750 MG TB24 Take 1 tablet (750 mg total) by mouth at bedtime. 90 tablet 4  . levETIRAcetam 750 MG TB24 Take 1 tablet (750 mg total) by mouth at bedtime. 90 tablet 4   No facility-administered medications prior to visit.    PAST MEDICAL HISTORY: Past Medical History  Diagnosis Date  . Seizure disorder   . Esophageal reflux   . Allergic rhinitis   . Asthma   . Other and unspecified hyperlipidemia   . Memory loss   . Unspecified hypothyroidism   . Dizziness and giddiness   . Depressive disorder, not elsewhere classified   . 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 Appendectomy    . Right hip replacement  2005  FAMILY HISTORY: Family History  Problem Relation Age of Onset  . Heart disease Mother   . Heart disease Father   . Asthma Sister   . Memory loss Mother     SOCIAL HISTORY:  History   Social History  . Marital Status: Married    Spouse Name: Eugenia    Number of Children: 2  . Years of Education: BA   Occupational History  . retired Arboriculturist   .     Social History Main Topics  . Smoking status: Never Smoker   . Smokeless tobacco: Never Used  . Alcohol Use: Not on file  . Drug Use: Not on file  . Sexual Activity: Not on file   Other Topics Concern  . Not on file   Social History Narrative   Patient lives at home with family. ([place])   Caffeine Use: 2 cups daily     PHYSICAL EXAM  Filed Vitals:   10/24/14 1405  BP: 96/53  Pulse: 63  Temp: 96.8 F (36 C)  TempSrc: Oral  Height: 5\' 7"  (1.702  m)  Weight: 151 lb 3.2 oz (68.584 kg)    Not recorded     Body mass index is 23.68 kg/(m^2).  MMSE - Mini Mental State Exam 10/24/2014 04/21/2014  Orientation to time 5 5  Orientation to Place 5 5  Registration 3 3  Attention/ Calculation 3 4  Recall 2 1  Language- name 2 objects 2 2  Language- repeat 1 1  Language- follow 3 step command 2 2  Language- read & follow direction 1 1  Write a sentence 1 1  Copy design 1 1  Total score 26 26     GENERAL EXAM: Patient is in no distress; well developed, nourished and groomed; neck is supple  CARDIOVASCULAR: Regular rate and rhythm, no murmurs, no carotid bruits  NEUROLOGIC: MENTAL STATUS: awake, alert, language fluent, comprehension intact, naming intact, fund of knowledge appropriate; GDS 2. NO FRONTAL RELEASE SIGNS.  CRANIAL NERVE: pupils equal and reactive to light, visual fields full to confrontation, extraocular muscles intact, no nystagmus, facial sensation and strength symmetric, hearing intact, palate elevates symmetrically, uvula midline, shoulder shrug symmetric, tongue midline. MOTOR: normal bulk and tone, full strength in the BUE, BLE SENSORY: normal and symmetric to light touch COORDINATION: finger-nose-finger, fine finger movements normal REFLEXES: deep tendon reflexes present and symmetric GAIT/STATION: SLOW, LIMPING GAIT. STOOPED POSTURE.    DIAGNOSTIC DATA (LABS, IMAGING, TESTING) - I reviewed patient records, labs, notes, testing and imaging myself where available.  No results found for: WBC No results found for: NA No results found for: CHOL No results found for: HGBA1C No results found for: VITAMINB12 No results found for: TSH  12/12/12 MRI brain - Mild generalized cerebral atrophy. No significant changes compared with prior MRI scan dated 01/09/2010.  09/25/05 EEG - normal   ASSESSMENT AND PLAN  81 y.o. year old male here with complex partial seizures and memory loss. May have had a seizure vs memory  lapse spell in March 2015. Now on slightly higher LEV and doing well.   PLAN: - Monitor symptoms.  - Continue LEV XR 750mg  qhs - Caution with driving.  Return in about 6 months (around 04/24/2015).    Penni Bombard, MD 03/24/8755, 4:33 PM Certified in Neurology, Neurophysiology and Neuroimaging  [facility] [address], [place], [place] [address] [phone]

## 2014-11-23 ENCOUNTER — Telehealth: Payer: Self-pay | Admitting: Diagnostic Neuroimaging

## 2014-11-23 NOTE — Telephone Encounter (Signed)
Pt states he has been taking Levetiracetam 750 MG TB24 at night, but he is really sleepy the next day.  If wants to know if maybe he is taking too much.  Please call and advise.

## 2014-11-23 NOTE — Telephone Encounter (Signed)
I called back.  Patient says he has been taking Keppra XR for quite some time, but every since he started taking it he has trouble waking up in the mornings because it makes him very drowsy.  He currently takes it in the evening, and is hesitant to take it earlier in the day due to drowsiness.  He would like to know if his dose should be adjusted, or if there is something else the provider recommends.  Please advise.  Thank you.

## 2014-12-02 DIAGNOSIS — H2513 Age-related nuclear cataract, bilateral: Secondary | ICD-10-CM | POA: Diagnosis not present

## 2014-12-08 DIAGNOSIS — M5136 Other intervertebral disc degeneration, lumbar region: Secondary | ICD-10-CM | POA: Diagnosis not present

## 2014-12-08 DIAGNOSIS — M545 Low back pain: Secondary | ICD-10-CM | POA: Diagnosis not present

## 2014-12-08 DIAGNOSIS — Z6825 Body mass index (BMI) 25.0-25.9, adult: Secondary | ICD-10-CM | POA: Diagnosis not present

## 2015-01-11 DIAGNOSIS — E039 Hypothyroidism, unspecified: Secondary | ICD-10-CM | POA: Diagnosis not present

## 2015-01-11 DIAGNOSIS — Z125 Encounter for screening for malignant neoplasm of prostate: Secondary | ICD-10-CM | POA: Diagnosis not present

## 2015-01-11 DIAGNOSIS — E785 Hyperlipidemia, unspecified: Secondary | ICD-10-CM | POA: Diagnosis not present

## 2015-01-16 DIAGNOSIS — Z0000 Encounter for general adult medical examination without abnormal findings: Secondary | ICD-10-CM | POA: Diagnosis not present

## 2015-01-16 DIAGNOSIS — Z6824 Body mass index (BMI) 24.0-24.9, adult: Secondary | ICD-10-CM | POA: Diagnosis not present

## 2015-01-16 DIAGNOSIS — R5383 Other fatigue: Secondary | ICD-10-CM | POA: Diagnosis not present

## 2015-01-16 DIAGNOSIS — G40309 Generalized idiopathic epilepsy and epileptic syndromes, not intractable, without status epilepticus: Secondary | ICD-10-CM | POA: Diagnosis not present

## 2015-01-16 DIAGNOSIS — K2270 Barrett's esophagus without dysplasia: Secondary | ICD-10-CM | POA: Diagnosis not present

## 2015-01-16 DIAGNOSIS — Z1212 Encounter for screening for malignant neoplasm of rectum: Secondary | ICD-10-CM | POA: Diagnosis not present

## 2015-01-16 DIAGNOSIS — Z23 Encounter for immunization: Secondary | ICD-10-CM | POA: Diagnosis not present

## 2015-01-16 DIAGNOSIS — M545 Low back pain: Secondary | ICD-10-CM | POA: Diagnosis not present

## 2015-01-16 DIAGNOSIS — K219 Gastro-esophageal reflux disease without esophagitis: Secondary | ICD-10-CM | POA: Diagnosis not present

## 2015-01-16 DIAGNOSIS — F0390 Unspecified dementia without behavioral disturbance: Secondary | ICD-10-CM | POA: Diagnosis not present

## 2015-01-16 DIAGNOSIS — H6123 Impacted cerumen, bilateral: Secondary | ICD-10-CM | POA: Diagnosis not present

## 2015-01-16 DIAGNOSIS — Z1389 Encounter for screening for other disorder: Secondary | ICD-10-CM | POA: Diagnosis not present

## 2015-01-18 NOTE — Telephone Encounter (Signed)
Can setup followup appt with me to discuss further. -VRP

## 2015-01-20 NOTE — Telephone Encounter (Signed)
Called patient to schedule appt. No answer.

## 2015-01-23 ENCOUNTER — Telehealth: Payer: Self-pay | Admitting: *Deleted

## 2015-01-23 NOTE — Telephone Encounter (Signed)
Spoke to the pt on the phone and was able to get an appt made for 4/14 to come in and talk with Dr. Leta Baptist about his keppra dose. He thanked me

## 2015-01-30 DIAGNOSIS — J449 Chronic obstructive pulmonary disease, unspecified: Secondary | ICD-10-CM | POA: Diagnosis not present

## 2015-02-02 ENCOUNTER — Ambulatory Visit (INDEPENDENT_AMBULATORY_CARE_PROVIDER_SITE_OTHER): Payer: Medicare Other | Admitting: Diagnostic Neuroimaging

## 2015-02-02 ENCOUNTER — Encounter: Payer: Self-pay | Admitting: Diagnostic Neuroimaging

## 2015-02-02 VITALS — BP 95/56 | HR 67 | Ht 65.0 in | Wt 157.7 lb

## 2015-02-02 DIAGNOSIS — R413 Other amnesia: Secondary | ICD-10-CM

## 2015-02-02 DIAGNOSIS — G40209 Localization-related (focal) (partial) symptomatic epilepsy and epileptic syndromes with complex partial seizures, not intractable, without status epilepticus: Secondary | ICD-10-CM | POA: Diagnosis not present

## 2015-02-02 NOTE — Progress Notes (Signed)
[facility]  PATIENT: [name] DOB: 1934/09/11  REFERRING CLINICIAN:  HISTORY FROM: patient and wife REASON FOR VISIT: follow up   HISTORICAL  CHIEF COMPLAINT:  Chief Complaint  Patient presents with  . Follow-up    epilepsy    HISTORY OF PRESENT ILLNESS:   UPDATE 02/02/15: Since last visit, memory prob continue. No sz. More sleepy in daytime.   UPDATE 10/24/14: Since last visit, doing well. No sz. No spells. Memory loss is stable.   UPDATE 04/21/14: Since last visit, was doing well until 12/29/13, at coffee shop with friends, then had a 1 min episode of staring/memory loss. No convulsions or syncope. He thought this may have been a "mini sz" but he didn't contact our office. He contacted PCP, and was recommended to add LEV 250mg  in AM, in addition to his 500mg  XR tabs in the evening. Memory loss is stable. Still driving. Recently transitioned to retirement community.   UPDATE 05/27/13 (Dr. Rexene Alberts): 81 year old right-handed gentleman who presents for followup consultation of his complex partial seizure disorder. He is accompanied by his wife today. This is his first visit after Dr. Tressia Danas retirement and he was last seen by Dr. Jeneen Rinks love on 12/02/2012, at which time Dr. Erling Cruz did not change his antiseizure medication and felt hesitant to start donepezil for memory loss as it can lower seizure threshold. He suggested brain MRI and lab work including [place] level. His MMSE at that time was 27/30, clock drawing was 4, animal fluency was 9. The patient has an underlying medical history of asthma, esophagitis, thyroid disease, arthritis status post right hip replacement surgery in 2005 and seizure disorder. He is currently on pravastatin, B12, axona, omeprazole, Synthroid, Advair, Keppra, vitamin B6, multivitamin.  PRIOR HPI (12/02/12, Dr. Erling Cruz): 81 year old right-handed white married male with complex partial seizures first diagnosed in 1983 with normal CAT scan and MRI study  of the brain but two EEGs showing evidence of left temporal lobe epileptiform activity. He developed side effects on Dilantin which was discontinued in 1986. He  had 2 simple partial seizures and Dilantin was reinstituted. Dilantin was changed to Tegretol but he developed lethargy on Tegretol and was tapered off.He had a seizure 05/10/1997 and Tegretol was restarted.He was tried of Depakote, but was tapered off Depakote at [facility]. In 2006 he began having episodes of strange feeling lasting 20-30 seconds with dj vu and memory loss. During these episodes he would be unresponsive according his wife who would shake him. MRI 06/05/2005 was normal except for mild atrophy and EEG 10/11/2005 was normal. He was placed on Lamictal but did not tolerate this well and continued to have seizures on 300 mg per day. He had a witnessed seizure 08/26/2006 in [place] office. He was slowly changed to [place] and has not had recurrent seizures but has had poor tolerance with sleepiness. He was changed from 500 mg twice per day to 500 mg ER twice daily and subsequently 250 mg levetiracetam 3 times per day. He is now on levetiracetam 500 mg ER once daily . I saw him 06/19/09 for episodes of vague sensation with spinning lasting 20-30 seconds. These would occur sitting, lying, standing, and when turning over in bed. There was no loss of consciousness or aura of macropsia, micropsia, dj vu, strange odors or  tastes. His examination was normal with negative Dix-Hallpike hanging head test. He started  vertigo exercises and his symptoms resolved.  If he does not do his exercises the symptoms recur.  He complains of memory loss He has been evaluated 06/2007 for memory loss with a neuropsychological battery showing superior intelligence, but loss of short-term and delayed memory. His mother had memory loss beginning in her 27s. He is able to pay the bills and right the checks.He can't  remember what day it is. He has difficulty with  peoples names that he has known for years. He takes naps for 15 minutes after breakfast, and one to 2 naps after lunch. He drinks coffee during the day, small amounts  because of his Barrett's esophagitis which he states helps his memory. He does not snore at night. He awakens refreshed in the mornings. He is independent in activities of daily. 12/03/2011=(MMSE 26/30. CDT 4/4. AFT 12. [name] Index of independence in activities of daily living 6. Lawton-Brody instrumental activities of daily living scale 7. Neuropsychiatric inventory=0. Geriatric depression scale 2/15. Falls assessment tool score 5.CBC, CMP,  lipid profile, and TSH normal 11/13/11. He has not had recurrent seizures. He denies macropsia, micropsia, strange odors or tastes.He complains of daytime sleepiness and takeslevetiracetam 500 mg ER once at night. He complains of worsening long term memory, particularly noticed after gathering of classmates from his college days. He is performing [email or web address] and an exercise program with  improvement  in his memory He exercises by doing calisthenics 5 times per week, but  injured his right ankle many years ago when he fell off of a tank and has recurrent right ankle pain, uses a brace. 12/02/2012=( MMSE 27/30. CDT 4/4. AFT 9. [name] 6. Law[place]-Brody 7. Neuropsychiatric inventory=0.  Geriatric depression scale 1/15 Falls assessment tool score 5).   REVIEW OF SYSTEMS: Full 14 system review of systems performed and notable only for memory loss joint pain hearing loss runny nose leg swelling.    ALLERGIES: Allergies  Allergen Reactions  . Lamotrigine Other (See Comments)    Drowsiness  . Sulfonamide Derivatives     HOME MEDICATIONS: Outpatient Prescriptions Prior to Visit  Medication Sig Dispense Refill  . acetaminophen (TYLENOL) 500 MG tablet Take 500 mg by mouth every 6 (six) hours as needed for pain.    . finasteride (PROSCAR) 5 MG tablet Take 1 tablet by mouth daily.  0  . Levetiracetam 750 MG TB24  Take 1 tablet (750 mg total) by mouth at bedtime. 90 tablet 4  . levothyroxine (SYNTHROID, LEVOTHROID) 125 MCG tablet Take 125 mcg by mouth daily.      . Multiple Vitamin (MULTIVITAMIN) tablet Take 1 tablet by mouth daily.      Marland Kitchen omeprazole (PRILOSEC) 20 MG capsule Take 20 mg by mouth daily.      . pravastatin (PRAVACHOL) 20 MG tablet Take 1 tablet by mouth daily.    . Pyridoxine HCl (VITAMIN B-6) 500 MG tablet Take 500 mg by mouth daily.      . vitamin B-12 (CYANOCOBALAMIN) 1000 MCG tablet Take 1,000 mcg by mouth daily.      Marland Kitchen albuterol (PROAIR HFA) 108 (90 BASE) MCG/ACT inhaler Inhale 2 puffs into the lungs every 4 (four) hours as needed.     No facility-administered medications prior to visit.    PAST MEDICAL HISTORY: Past Medical History  Diagnosis Date  . Seizure disorder   . Esophageal reflux   . Allergic rhinitis   . Asthma   . Other and unspecified hyperlipidemia   . Memory loss   . Unspecified hypothyroidism   . Dizziness and giddiness   . Depressive disorder, not elsewhere classified   . Localization-related (  focal) (partial) epilepsy and epileptic syndromes with complex partial seizures, with intractable epilepsy     PAST SURGICAL HISTORY: Past Surgical History  Procedure Laterality Date  . Appendectomy    . Right hip replacement  2005    FAMILY HISTORY: Family History  Problem Relation Age of Onset  . Heart disease Mother   . Heart disease Father   . Asthma Sister   . Memory loss Mother     SOCIAL HISTORY:  History   Social History  . Marital Status: Married    Spouse Name: [name]  . Number of Children: 2  . Years of Education: BA   Occupational History  . retired Arboriculturist   .     Social History Main Topics  . Smoking status: Never Smoker   . Smokeless tobacco: Never Used  . Alcohol Use: No  . Drug Use: No  . Sexual Activity: Not on file   Other Topics Concern  . Not on file   Social History Narrative   Patient lives at home with  family. ([place])   Caffeine Use: up to 4 cups daily     PHYSICAL EXAM  Filed Vitals:   02/02/15 1329  BP: 95/56  Pulse: 67  Height: 5\' 5"  (1.651 m)  Weight: 157 lb 11.2 oz (71.532 kg)    Not recorded     Body mass index is 26.24 kg/(m^2).  MMSE - Mini Mental State Exam 10/24/2014 04/21/2014  Orientation to time 5 5  Orientation to Place 5 5  Registration 3 3  Attention/ Calculation 3 4  Recall 2 1  Language- name 2 objects 2 2  Language- repeat 1 1  Language- follow 3 step command 2 2  Language- read & follow direction 1 1  Write a sentence 1 1  Copy design 1 1  Total score 26 26     GENERAL EXAM: Patient is in no distress; well developed, nourished and groomed; neck is supple  CARDIOVASCULAR: Regular rate and rhythm, no murmurs, no carotid bruits  NEUROLOGIC: MENTAL STATUS: awake, alert, language fluent, comprehension intact, naming intact, fund of knowledge appropriate.  CRANIAL NERVE: pupils equal and reactive to light, visual fields full to confrontation, extraocular muscles intact, no nystagmus, facial sensation and strength symmetric, hearing intact, palate elevates symmetrically, uvula midline, shoulder shrug symmetric, tongue midline. MOTOR: normal bulk and tone, full strength in the BUE, BLE SENSORY: normal and symmetric to light touch COORDINATION: finger-nose-finger, fine finger movements normal REFLEXES: deep tendon reflexes present and symmetric GAIT/STATION: SLOW, LIMPING GAIT. STOOPED POSTURE.    DIAGNOSTIC DATA (LABS, IMAGING, TESTING) - I reviewed patient records, labs, notes, testing and imaging myself where available.  No results found for: WBC No results found for: NA No results found for: CHOL No results found for: HGBA1C No results found for: VITAMINB12 No results found for: TSH  12/12/12 MRI brain - Mild generalized cerebral atrophy. No significant changes compared with prior MRI scan dated 01/09/2010.  09/25/05 EEG -  normal   ASSESSMENT AND PLAN  81 y.o. year old male here with complex partial seizures and memory loss. May have had a seizure vs memory lapse spell in March 2015. No more events since on LEV 750mg  daily. Memory loss continues, possible MCI, but not likely neurodegenerative dementia.    PLAN: - Continue LEV XR 750mg  qhs. - Caution with driving. - Continue exercise, mental and socially stimulating activities.  Return in about 1 year (around 02/02/2016).  I spent 25 minutes of  face to face time with patient. Greater than 50% of time was spent in counseling and coordination of care with patient.    Penni Bombard, MD 5/57/3220, 2:54 PM Certified in Neurology, Neurophysiology and Neuroimaging  [facility] [address] [place], [place] [address] [phone]

## 2015-02-21 DIAGNOSIS — M5136 Other intervertebral disc degeneration, lumbar region: Secondary | ICD-10-CM | POA: Diagnosis not present

## 2015-02-21 DIAGNOSIS — M19271 Secondary osteoarthritis, right ankle and foot: Secondary | ICD-10-CM | POA: Diagnosis not present

## 2015-02-27 ENCOUNTER — Telehealth: Payer: Self-pay

## 2015-02-27 ENCOUNTER — Ambulatory Visit (INDEPENDENT_AMBULATORY_CARE_PROVIDER_SITE_OTHER): Payer: Medicare Other | Admitting: Neurology

## 2015-02-27 ENCOUNTER — Encounter: Payer: Self-pay | Admitting: Neurology

## 2015-02-27 VITALS — BP 110/60 | HR 60 | Resp 14 | Ht 67.0 in | Wt 148.0 lb

## 2015-02-27 DIAGNOSIS — M545 Low back pain: Secondary | ICD-10-CM | POA: Diagnosis not present

## 2015-02-27 DIAGNOSIS — G40209 Localization-related (focal) (partial) symptomatic epilepsy and epileptic syndromes with complex partial seizures, not intractable, without status epilepticus: Secondary | ICD-10-CM | POA: Diagnosis not present

## 2015-02-27 DIAGNOSIS — R413 Other amnesia: Secondary | ICD-10-CM | POA: Diagnosis not present

## 2015-02-27 DIAGNOSIS — M25571 Pain in right ankle and joints of right foot: Secondary | ICD-10-CM | POA: Diagnosis not present

## 2015-02-27 DIAGNOSIS — G4700 Insomnia, unspecified: Secondary | ICD-10-CM | POA: Diagnosis not present

## 2015-02-27 DIAGNOSIS — R2689 Other abnormalities of gait and mobility: Secondary | ICD-10-CM | POA: Diagnosis not present

## 2015-02-27 NOTE — Patient Instructions (Signed)
Please ask family and friends or your significant other or bed partner if you snore and if so, how loud it is, and if you have breathing related issues in your sleep, such as: snorting sounds, choking sounds, pauses in your breathing or shallow breathing events. These may be symptoms of obstructive sleep apnea (OSA).   I will see you back as needed.

## 2015-02-27 NOTE — Telephone Encounter (Signed)
Patient was in the office today and inquired about refills on generic Keppra XR.  It appears a one year Rx was sent in Jan.  I called the pharmacy.  They indicate a prior Josem Kaufmann is required by [facility] Phone [phone] ID# [id number].  I contacted ins and provided clinical info.  Request is currently under review Ref Key: [id number].  I called the patient back to advise.  They are aware.

## 2015-02-27 NOTE — Progress Notes (Addendum)
Subjective:    Patient ID: [name] is a 81 y.o. male.  HPI       Dear Dr. Philip Aspen,   I saw your patient, [name], upon your kind request in my clinic today for initial consultation of his sleep disorder, in particular concern for insomnia. The patient is unaccompanied today. As you know, [name] is a very pleasant 15 year old right-handed gentleman with an underlying medical history of seizure disorder, allergic rhinitis, and reflux disease whom I met a couple years ago for follow-up of his seizures. He used to follow with Dr. love and has most recently established care with Dr. Leta Baptist. Seizure wise he has been stable. He reports some memory loss for short-term memory. He may have had some issues with insomnia a few months ago but he feels that these issues have resolved. He had some issues with pain in his right ankle. Of note, years ago he had an injury to his right hip and right ankle. He is status post right hip replacement surgery and has been wearing a brace on his right ankle. Ankle pain has improved. He reports no significant sleep related issues at this time. He lives with his wife in independent living at [facility]. His bedtime is around 10:30 to 11 and he falls asleep usually within 10 minutes she states. Wake time is 645 typically. Sometimes he watches TV at night. He takes a nap in the afternoon after lunch. He retired as an Arboriculturist at age 43. He denies any snoring or restless leg symptoms. He drinks 2 cups of coffee usually in the morning. He denies drinking alcohol or sodas and is a nonsmoker. He denies any parasomnias.   I reviewed your office note from 01/16/2015. He was complaining of fatigue during the day at the time. Blood test results from 01/11/2015 through your office were reviewed as well: CMP was unremarkable with the exception of total protein of 5.6, CBC with differential was unremarkable with the exception of white count of 3.5, total cholesterol  156, LDL 100, triglycerides 48, TSH borderline at 0.37, free T4 normal at 1.3, PSA normal at 0.386. Urinalysis unremarkable.   His Past Medical History Is Significant For: Past Medical History  Diagnosis Date  . Seizure disorder   . Esophageal reflux   . Allergic rhinitis   . Asthma   . Other and unspecified hyperlipidemia   . Memory loss   . Unspecified hypothyroidism   . Dizziness and giddiness   . Depressive disorder, not elsewhere classified   . 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 Barrett syndrome     His Past Surgical History Is Significant For: Past Surgical History  Procedure Laterality Date  . Appendectomy    . Right hip replacement  2005    His Family History Is Significant For: Family History  Problem Relation Age of Onset  . Heart disease Mother   . Heart disease Father   . Asthma Sister   . Memory loss Mother     His Social History Is Significant For: History   Social History  . Marital Status: Married    Spouse Name: [name]  . Number of Children: 2  . Years of Education: BA   Occupational History  . retired Arboriculturist   .     Social History Main Topics  . Smoking status: Never Smoker   . Smokeless tobacco: Never Used  . Alcohol Use: No  .  Drug Use: No  . Sexual Activity: Not on file   Other Topics Concern  . None   Social History Narrative   Patient lives at home with family. ([place])   Caffeine Use: up to 3 cups daily    His Allergies Are:  Allergies  Allergen Reactions  . Lamotrigine Other (See Comments)    Drowsiness  . Sulfonamide Derivatives   :   His Current Medications Are:  Outpatient Encounter Prescriptions as of 02/27/2015  Medication Sig  . cromolyn (NASALCROM) 5.2 MG/ACT nasal spray Place 1 spray into both nostrils 2 (two) times daily.  . finasteride (PROSCAR) 5 MG tablet Take 1 tablet by mouth daily.  . Levetiracetam  750 MG TB24 Take 1 tablet (750 mg total) by mouth at bedtime.  Marland Kitchen levothyroxine (SYNTHROID, LEVOTHROID) 125 MCG tablet Take 125 mcg by mouth daily.    . Multiple Vitamin (MULTIVITAMIN) tablet Take 1 tablet by mouth daily.    Marland Kitchen omeprazole (PRILOSEC) 20 MG capsule Take 20 mg by mouth daily.    . pravastatin (PRAVACHOL) 20 MG tablet Take 1 tablet by mouth daily.  . Pyridoxine HCl (VITAMIN B-6) 500 MG tablet Take 500 mg by mouth daily.    . traMADol (ULTRAM) 50 MG tablet   . vitamin B-12 (CYANOCOBALAMIN) 1000 MCG tablet Take 1,000 mcg by mouth daily.    . VOLTAREN 1 % GEL   . [DISCONTINUED] acetaminophen (TYLENOL) 500 MG tablet Take 500 mg by mouth every 6 (six) hours as needed for pain.  . [DISCONTINUED] Apoaequorin 10 MG CAPS Take 1 capsule by mouth daily.   No facility-administered encounter medications on file as of 02/27/2015.  :  Review of Systems:  Out of a complete 14 point review of systems, all are reviewed and negative with the exception of these symptoms as listed below:   Review of Systems  Endocrine:       Feeling cold  Musculoskeletal:       Joint pain   Neurological:       Memory loss, dizziness, wakes up 1 or 2 times during the night, denies snoring, denies witnessed apnea, sometimes sleepy after breakfast, takes naps during the day. Sometimes feels "sleepy during day".     Objective:  Neurologic Exam  Physical Exam Physical Examination:   Filed Vitals:   02/27/15 1036  BP: 110/60  Pulse: 60  Resp: 14   General Examination: The patient is a very pleasant 81 y.o. male in no acute distress. He appears well-developed and well-nourished and well groomed.   HEENT: Normocephalic, atraumatic, pupils are equal, round and reactive to light and accommodation. Funduscopic exam is normal with sharp disc margins noted. Extraocular tracking is good without limitation to gaze excursion or nystagmus noted. Normal smooth pursuit is noted. Hearing is grossly intact. Tympanic  membranes are clear bilaterally. Face is symmetric with normal facial animation and normal facial sensation. Speech is clear with no dysarthria noted. There is no hypophonia. There is no lip, neck/head, jaw or voice tremor. Neck is supple with full range of passive and active motion. There are no carotid bruits on auscultation. Oropharynx exam reveals: mild mouth dryness, adequate dental hygiene and no significant airway crowding, and tonsils are absent. Mallampati is class I. Tongue protrudes centrally and palate elevates symmetrically.   Chest: Clear to auscultation without wheezing, rhonchi or crackles noted.  Heart: S1+S2+0, regular and normal without murmurs, rubs or gallops noted.   Abdomen: Soft, non-tender and non-distended with normal bowel sounds appreciated  on auscultation.  Extremities: There is no pitting edema in the distal lower extremities bilaterally. Pedal pulses are intact.  Skin: Warm and dry without trophic changes noted. There are no varicose veins.  Musculoskeletal: exam reveals no obvious joint deformities, tenderness or joint swelling or erythema, with the exception of mildly enlarged ankle joint on the right. He wears a ankle brace which he removed for the exam.   Neurologically:  Mental status: The patient is awake, alert and oriented in all 4 spheres. His immediate and remote memory, attention, language skills and fund of knowledge are mildly impaired. His MMSE is 23/30, CDT: 4/4, AFT: 15/min 02/27/2015.  There is no evidence of aphasia, agnosia, apraxia or anomia. Speech is clear with normal prosody and enunciation. Thought process is linear. Mood is normal and affect is normal.  Cranial nerves II - XII are as described above under HEENT exam. In addition: shoulder shrug is normal with equal shoulder height noted. Motor exam: Normal bulk, strength and tone is noted. There is no drift, tremor or rebound. Romberg is negative. Reflexes are 2+ throughout. Babinski: Toes are  flexor bilaterally. Fine motor skills and coordination: intact with normal finger taps, normal hand movements, normal rapid alternating patting, normal foot taps and normal foot agility.  Cerebellar testing: No dysmetria or intention tremor on finger to nose testing.  Sensory exam: intact to light touch, temperature sense and vibration sense in the upper and lower extremities.  Gait, station and balance: He stands with difficulty. No veering to one side is noted. No leaning to one side is noted. Posture is age-appropriate and stance is narrow based. Gait shows a mild limp on the right. His right hip makes a clicking sound while he is walking. Tandem walk is not possible for him.  Assessment and Plan:   In summary, CAINE BARFIELD is a very pleasant 81 y.o.-year old male with an underlying medical history of seizure disorder, allergic rhinitis, and reflux disease who had some issues with insomnia a few months ago, which he feels have resolved. He denies any current sleep-related problems. He denies snoring or sleep disordered breathing, he denies restless leg symptoms and denies excessive daytime somnolence. His Epworth sleepiness score today was 6 and his fatigue score was 24 area at this juncture, I have asked him to follow-up with me on an as-needed basis. He has an appointment with Dr. Leta Baptist in July and he is advised to continue taking his seizure medication.   I answered all his questions today and he was in agreement  Thank you very much for allowing me to participate in the care of this nice patient. If I can be of any further assistance to you please do not hesitate to call me at [phone].  Sincerely,   Star Age, MD, PhD

## 2015-02-28 NOTE — Telephone Encounter (Signed)
Ins has approved the request for coverage on Levetiracetam XR effective until 02/27/2016 Ref # [id number] Patient has been notified as well.

## 2015-03-01 DIAGNOSIS — M545 Low back pain: Secondary | ICD-10-CM | POA: Diagnosis not present

## 2015-03-01 DIAGNOSIS — R2689 Other abnormalities of gait and mobility: Secondary | ICD-10-CM | POA: Diagnosis not present

## 2015-03-01 DIAGNOSIS — M25571 Pain in right ankle and joints of right foot: Secondary | ICD-10-CM | POA: Diagnosis not present

## 2015-03-03 DIAGNOSIS — R2689 Other abnormalities of gait and mobility: Secondary | ICD-10-CM | POA: Diagnosis not present

## 2015-03-03 DIAGNOSIS — M545 Low back pain: Secondary | ICD-10-CM | POA: Diagnosis not present

## 2015-03-03 DIAGNOSIS — M25571 Pain in right ankle and joints of right foot: Secondary | ICD-10-CM | POA: Diagnosis not present

## 2015-03-07 DIAGNOSIS — M545 Low back pain: Secondary | ICD-10-CM | POA: Diagnosis not present

## 2015-03-07 DIAGNOSIS — M25571 Pain in right ankle and joints of right foot: Secondary | ICD-10-CM | POA: Diagnosis not present

## 2015-03-07 DIAGNOSIS — R2689 Other abnormalities of gait and mobility: Secondary | ICD-10-CM | POA: Diagnosis not present

## 2015-03-08 DIAGNOSIS — R2689 Other abnormalities of gait and mobility: Secondary | ICD-10-CM | POA: Diagnosis not present

## 2015-03-08 DIAGNOSIS — M25571 Pain in right ankle and joints of right foot: Secondary | ICD-10-CM | POA: Diagnosis not present

## 2015-03-08 DIAGNOSIS — M545 Low back pain: Secondary | ICD-10-CM | POA: Diagnosis not present

## 2015-03-10 DIAGNOSIS — M25571 Pain in right ankle and joints of right foot: Secondary | ICD-10-CM | POA: Diagnosis not present

## 2015-03-10 DIAGNOSIS — R2689 Other abnormalities of gait and mobility: Secondary | ICD-10-CM | POA: Diagnosis not present

## 2015-03-10 DIAGNOSIS — M545 Low back pain: Secondary | ICD-10-CM | POA: Diagnosis not present

## 2015-03-23 DIAGNOSIS — M5136 Other intervertebral disc degeneration, lumbar region: Secondary | ICD-10-CM | POA: Diagnosis not present

## 2015-03-23 DIAGNOSIS — M19271 Secondary osteoarthritis, right ankle and foot: Secondary | ICD-10-CM | POA: Diagnosis not present

## 2015-04-12 ENCOUNTER — Telehealth: Payer: Self-pay | Admitting: Diagnostic Neuroimaging

## 2015-04-12 NOTE — Telephone Encounter (Signed)
Patient is calling because his memory is getting worse. Please call to discuss. Thank you.

## 2015-04-12 NOTE — Telephone Encounter (Signed)
LMVM that was returning his call.

## 2015-04-13 NOTE — Telephone Encounter (Signed)
I called pt and he states would like to discuss a possible medication to use for his memory issues.   States he has some days better then others.  Taking Prevagen 10mg  daily OTC, not helping.  He has appt 04-27-15 1440 and would discuss then.

## 2015-04-13 NOTE — Telephone Encounter (Signed)
Patient called returning Sandra's phone call.

## 2015-04-19 ENCOUNTER — Telehealth: Payer: Self-pay | Admitting: Diagnostic Neuroimaging

## 2015-04-19 NOTE — Telephone Encounter (Signed)
Patient called stating he is taking donepezil 44mc for memory prescribed by Dr Leanna Battles. He has discussed increasing doseage with Dr Philip Aspen but he is unsure if increasing could cause seizure.  What would be proper dose for increasing if not side effect? Please call and advise. Patient can be reached at [phone].

## 2015-04-19 NOTE — Telephone Encounter (Signed)
Ok to increase to donepezil 10mg  at bedtime if Dr. Philip Aspen agrees. -VRP

## 2015-04-19 NOTE — Telephone Encounter (Signed)
I called the patient back.  Relayed providers note.  Patient expressed understanding and was agreeable to this plan.  He will speak with Dr Philip Aspen, and will call us back if anything further is needed.

## 2015-04-27 ENCOUNTER — Ambulatory Visit (INDEPENDENT_AMBULATORY_CARE_PROVIDER_SITE_OTHER): Payer: Medicare Other | Admitting: Diagnostic Neuroimaging

## 2015-04-27 ENCOUNTER — Encounter: Payer: Self-pay | Admitting: Diagnostic Neuroimaging

## 2015-04-27 VITALS — Ht 66.0 in | Wt 156.2 lb

## 2015-04-27 DIAGNOSIS — R413 Other amnesia: Secondary | ICD-10-CM | POA: Diagnosis not present

## 2015-04-27 DIAGNOSIS — G40209 Localization-related (focal) (partial) symptomatic epilepsy and epileptic syndromes with complex partial seizures, not intractable, without status epilepticus: Secondary | ICD-10-CM

## 2015-04-27 NOTE — Progress Notes (Signed)
[facility]  PATIENT: [name] DOB: 1934/05/17  REFERRING CLINICIAN:  HISTORY FROM: patient and wife REASON FOR VISIT: follow up   HISTORICAL  CHIEF COMPLAINT:  Chief Complaint  Patient presents with  . Memory Loss    rm 7, wife, [name], MMSE 28  . Follow-up    hx epilepsy    HISTORY OF PRESENT ILLNESS:   UPDATE 04/27/15: Since last visit, still c/o memory issues. Mainly names and appts. Tries to write things down and and stay organized. Now living at [facility].   UPDATE 02/02/15: Since last visit, memory prob continue. No sz. More sleepy in daytime.   UPDATE 10/24/14: Since last visit, doing well. No sz. No spells. Memory loss is stable.   UPDATE 04/21/14: Since last visit, was doing well until 12/29/13, at coffee shop with friends, then had a 1 min episode of staring/memory loss. No convulsions or syncope. He thought this may have been a "mini sz" but he didn't contact our office. He contacted PCP, and was recommended to add LEV 250mg  in AM, in addition to his 500mg  XR tabs in the evening. Memory loss is stable. Still driving. Recently transitioned to retirement community.   UPDATE 05/27/13 (Dr. Rexene Alberts): 81 year old right-handed gentleman who presents for followup consultation of his complex partial seizure disorder. He is accompanied by his wife today. This is his first visit after Dr. Tressia Danas retirement and he was last seen by Dr. Jeneen Rinks love on 12/02/2012, at which time Dr. Erling Cruz did not change his antiseizure medication and felt hesitant to start donepezil for memory loss as it can lower seizure threshold. He suggested brain MRI and lab work including [place] level. His MMSE at that time was 27/30, clock drawing was 4, animal fluency was 9. The patient has an underlying medical history of asthma, esophagitis, thyroid disease, arthritis status post right hip replacement surgery in 2005 and seizure disorder. He is currently on pravastatin, B12, axona, omeprazole,  Synthroid, Advair, Keppra, vitamin B6, multivitamin.  PRIOR HPI (12/02/12, Dr. Erling Cruz): 81 year old right-handed white married male with complex partial seizures first diagnosed in 1983 with normal CAT scan and MRI study of the brain but two EEGs showing evidence of left temporal lobe epileptiform activity. He developed side effects on Dilantin which was discontinued in 1986. He  had 2 simple partial seizures and Dilantin was reinstituted. Dilantin was changed to Tegretol but he developed lethargy on Tegretol and was tapered off.He had a seizure 05/10/1997 and Tegretol was restarted.He was tried of Depakote, but was tapered off Depakote at [facility]. In 2006 he began having episodes of strange feeling lasting 20-30 seconds with dj vu and memory loss. During these episodes he would be unresponsive according his wife who would shake him. MRI 06/05/2005 was normal except for mild atrophy and EEG 10/11/2005 was normal. He was placed on Lamictal but did not tolerate this well and continued to have seizures on 300 mg per day. He had a witnessed seizure 08/26/2006 in [place] office. He was slowly changed to [place] and has not had recurrent seizures but has had poor tolerance with sleepiness. He was changed from 500 mg twice per day to 500 mg ER twice daily and subsequently 250 mg levetiracetam 3 times per day. He is now on levetiracetam 500 mg ER once daily . I saw him 06/19/09 for episodes of vague sensation with spinning lasting 20-30 seconds. These would occur sitting, lying, standing, and when turning over in bed. There was no loss of consciousness  or aura of macropsia, micropsia, dj vu, strange odors or  tastes. His examination was normal with negative Dix-Hallpike hanging head test. He started  vertigo exercises and his symptoms resolved.  If he does not do his exercises the symptoms recur. He complains of memory loss He has been evaluated 06/2007 for memory loss with a neuropsychological battery showing superior  intelligence, but loss of short-term and delayed memory. His mother had memory loss beginning in her 12s. He is able to pay the bills and right the checks.He can't  remember what day it is. He has difficulty with peoples names that he has known for years. He takes naps for 15 minutes after breakfast, and one to 2 naps after lunch. He drinks coffee during the day, small amounts  because of his Barrett's esophagitis which he states helps his memory. He does not snore at night. He awakens refreshed in the mornings. He is independent in activities of daily. 12/03/2011=(MMSE 26/30. CDT 4/4. AFT 12. [name] Index of independence in activities of daily living 6. [place] instrumental activities of daily living scale 7. Neuropsychiatric inventory=0. Geriatric depression scale 2/15. Falls assessment tool score 5.CBC, CMP,  lipid profile, and TSH normal 11/13/11. He has not had recurrent seizures. He denies macropsia, micropsia, strange odors or tastes.He complains of daytime sleepiness and takeslevetiracetam 500 mg ER once at night. He complains of worsening long term memory, particularly noticed after gathering of classmates from his college days. He is performing [email or web address] and an exercise program with  improvement  in his memory He exercises by doing calisthenics 5 times per week, but  injured his right ankle many years ago when he fell off of a tank and has recurrent right ankle pain, uses a brace. 12/02/2012=( MMSE 27/30. CDT 4/4. AFT 9. [name] 6. [place] 7. Neuropsychiatric inventory=0.  Geriatric depression scale 1/15 Falls assessment tool score 5).   REVIEW OF SYSTEMS: Full 14 system review of systems performed and notable only for memory loss daytime sleepiness leg swelling.   ALLERGIES: Allergies  Allergen Reactions  . Lamotrigine Other (See Comments)    Drowsiness  . Sulfonamide Derivatives     As a child    HOME MEDICATIONS: Outpatient Prescriptions Prior to Visit  Medication Sig Dispense  Refill  . cromolyn (NASALCROM) 5.2 MG/ACT nasal spray Place 1 spray into both nostrils 2 (two) times daily.    . finasteride (PROSCAR) 5 MG tablet Take 1 tablet by mouth daily.  0  . Levetiracetam 750 MG TB24 Take 1 tablet (750 mg total) by mouth at bedtime. 90 tablet 4  . Multiple Vitamin (MULTIVITAMIN) tablet Take 1 tablet by mouth daily.      Marland Kitchen omeprazole (PRILOSEC) 20 MG capsule Take 20 mg by mouth daily.      . pravastatin (PRAVACHOL) 20 MG tablet Take 1 tablet by mouth daily.    . Pyridoxine HCl (VITAMIN B-6) 500 MG tablet Take 500 mg by mouth daily.      . vitamin B-12 (CYANOCOBALAMIN) 1000 MCG tablet Take 1,000 mcg by mouth daily.      . VOLTAREN 1 % GEL   0  . traMADol (ULTRAM) 50 MG tablet   0  . levothyroxine (SYNTHROID, LEVOTHROID) 125 MCG tablet Take 125 mcg by mouth daily.       No facility-administered medications prior to visit.    PAST MEDICAL HISTORY: Past Medical History  Diagnosis Date  . Seizure disorder   . Esophageal reflux   . Allergic rhinitis   .  Asthma   . Other and unspecified hyperlipidemia   . Memory loss   . Unspecified hypothyroidism   . Dizziness and giddiness   . Depressive disorder, not elsewhere classified   . 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 Barrett syndrome     PAST SURGICAL HISTORY: Past Surgical History  Procedure Laterality Date  . Appendectomy    . Right hip replacement  2005    FAMILY HISTORY: Family History  Problem Relation Age of Onset  . Heart disease Mother   . Heart disease Father   . Asthma Sister   . Memory loss Mother     SOCIAL HISTORY:  History   Social History  . Marital Status: Married    Spouse Name: [name]  . Number of Children: 2  . Years of Education: BA   Occupational History  . retired Arboriculturist   .     Social History Main Topics  . Smoking status: Never Smoker   . Smokeless tobacco: Never  Used  . Alcohol Use: No  . Drug Use: No  . Sexual Activity: Not on file   Other Topics Concern  . Not on file   Social History Narrative   Patient lives at home with family. ([facility])   Caffeine Use: up to 3 cups daily     PHYSICAL EXAM  Filed Vitals:   04/27/15 1419  Height: 5\' 6"  (1.676 m)  Weight: 156 lb 3.2 oz (70.852 kg)    Not recorded     Body mass index is 25.22 kg/(m^2).  MMSE - Mini Mental State Exam 04/27/2015 02/27/2015 10/24/2014  Orientation to time 3 4 5   Orientation to Place 5 4 5   Registration 3 3 3   Attention/ Calculation 5 2 3   Recall 3 2 2   Language- name 2 objects 2 2 2   Language- repeat 1 1 1   Language- follow 3 step command 3 2 2   Language- read & follow direction 1 1 1   Write a sentence 1 1 1   Copy design 1 1 1   Total score 28 23 26      GENERAL EXAM: Patient is in no distress; well developed, nourished and groomed; neck is supple  CARDIOVASCULAR: Regular rate and rhythm, no murmurs, no carotid bruits  NEUROLOGIC: MENTAL STATUS: awake, alert, language fluent, comprehension intact, naming intact, fund of knowledge appropriate.  CRANIAL NERVE: pupils equal and reactive to light, visual fields full to confrontation, extraocular muscles intact, no nystagmus, facial sensation and strength symmetric, hearing intact, palate elevates symmetrically, uvula midline, shoulder shrug symmetric, tongue midline. MOTOR: normal bulk and tone, full strength in the BUE, BLE SENSORY: normal and symmetric to light touch COORDINATION: finger-nose-finger, fine finger movements normal REFLEXES: deep tendon reflexes present and symmetric GAIT/STATION: SLOW, LIMPING GAIT. STOOPED POSTURE.     DIAGNOSTIC DATA (LABS, IMAGING, TESTING) - I reviewed patient records, labs, notes, testing and imaging myself where available.  No results found for: WBC No results found for: NA No results found for: CHOL No results found for: HGBA1C No results found for:  VITAMINB12 No results found for: TSH  12/12/12 MRI brain - Mild generalized cerebral atrophy. No significant changes compared with prior MRI scan dated 01/09/2010.  09/25/05 EEG - normal   ASSESSMENT AND PLAN  81 y.o. year old male here with complex partial seizures and memory loss. May have had a seizure vs memory lapse spell in March 2015. No more events since on  LEV 750mg  daily. Memory loss continues, possible MCI, but not likely neurodegenerative dementia.    PLAN: I spent 15 minutes of face to face time with patient. Greater than 50% of time was spent in counseling and coordination of care with patient. In summary we discussed:  - Continue LEV XR 750mg  qhs. - Continue donepezil 10mg  (started per Dr. Philip Aspen) - Caution with driving - Continue exercise, mental and socially stimulating activities  Return in about 1 year (around 04/26/2016).    Penni Bombard, MD 11/27/348, 0:93 PM Certified in Neurology, Neurophysiology and Neuroimaging  [facility] [address], [place], [place] [address] [phone]

## 2015-06-19 ENCOUNTER — Telehealth: Payer: Self-pay | Admitting: Diagnostic Neuroimaging

## 2015-06-19 NOTE — Telephone Encounter (Signed)
Patient called back with same question. Please call.

## 2015-06-19 NOTE — Telephone Encounter (Signed)
Patient called, feels like his memory is not as good as it was, balance not as good as it was, tends to drop things. Wonders if this is caused by medication.

## 2015-06-21 NOTE — Telephone Encounter (Signed)
Spoke with patient and informed him per Dr Leta Baptist, his current issues are not from side effects of his medications. Explained that he is on same doses and that his issues are most likely some progression in his current condition. He verbalized understanding, appreciation for this call.

## 2015-06-23 NOTE — Telephone Encounter (Signed)
Likely disease progression. I can see him in follow up appt. -VRP

## 2015-06-27 ENCOUNTER — Telehealth: Payer: Self-pay | Admitting: *Deleted

## 2015-06-27 NOTE — Telephone Encounter (Signed)
Left vm for patient informing him that Dr Leta Baptist would be glad to see him for FU before his currently scheduled 1 year FU April 2017. Informed him telephone staff can schedule a FU for him, but if he has questions, he may leave message for this RN. Left this caller's name and office number.

## 2015-07-07 DIAGNOSIS — Z23 Encounter for immunization: Secondary | ICD-10-CM | POA: Diagnosis not present

## 2015-07-26 DIAGNOSIS — M545 Low back pain: Secondary | ICD-10-CM | POA: Diagnosis not present

## 2015-07-26 DIAGNOSIS — M25571 Pain in right ankle and joints of right foot: Secondary | ICD-10-CM | POA: Diagnosis not present

## 2015-08-07 DIAGNOSIS — M545 Low back pain: Secondary | ICD-10-CM | POA: Diagnosis not present

## 2015-08-07 DIAGNOSIS — M25571 Pain in right ankle and joints of right foot: Secondary | ICD-10-CM | POA: Diagnosis not present

## 2015-08-10 DIAGNOSIS — M545 Low back pain: Secondary | ICD-10-CM | POA: Diagnosis not present

## 2015-08-10 DIAGNOSIS — M25571 Pain in right ankle and joints of right foot: Secondary | ICD-10-CM | POA: Diagnosis not present

## 2015-08-15 DIAGNOSIS — M25571 Pain in right ankle and joints of right foot: Secondary | ICD-10-CM | POA: Diagnosis not present

## 2015-08-15 DIAGNOSIS — M545 Low back pain: Secondary | ICD-10-CM | POA: Diagnosis not present

## 2015-08-18 DIAGNOSIS — M25571 Pain in right ankle and joints of right foot: Secondary | ICD-10-CM | POA: Diagnosis not present

## 2015-08-18 DIAGNOSIS — M545 Low back pain: Secondary | ICD-10-CM | POA: Diagnosis not present

## 2015-08-22 DIAGNOSIS — M25571 Pain in right ankle and joints of right foot: Secondary | ICD-10-CM | POA: Diagnosis not present

## 2015-08-22 DIAGNOSIS — M545 Low back pain: Secondary | ICD-10-CM | POA: Diagnosis not present

## 2015-08-23 ENCOUNTER — Telehealth: Payer: Self-pay | Admitting: Diagnostic Neuroimaging

## 2015-08-23 NOTE — Telephone Encounter (Signed)
Patient called to advise that his memory seems to be getting worse, wonders if he needs to increase his medication. Patient will call back as he is getting ready to leave and it will be difficult to reach him.

## 2015-08-23 NOTE — Telephone Encounter (Signed)
Per Dr Leta Baptist, spoke with patient and advised him Dr Leta Baptist will make no medication changes at this time. Patient had no further questions, verbalized understanding.

## 2015-08-23 NOTE — Telephone Encounter (Signed)
No medication changes at this time. -VRP

## 2015-08-25 DIAGNOSIS — M545 Low back pain: Secondary | ICD-10-CM | POA: Diagnosis not present

## 2015-08-25 DIAGNOSIS — M25571 Pain in right ankle and joints of right foot: Secondary | ICD-10-CM | POA: Diagnosis not present

## 2015-08-29 DIAGNOSIS — M25571 Pain in right ankle and joints of right foot: Secondary | ICD-10-CM | POA: Diagnosis not present

## 2015-08-29 DIAGNOSIS — M545 Low back pain: Secondary | ICD-10-CM | POA: Diagnosis not present

## 2015-09-05 DIAGNOSIS — M545 Low back pain: Secondary | ICD-10-CM | POA: Diagnosis not present

## 2015-09-05 DIAGNOSIS — M25571 Pain in right ankle and joints of right foot: Secondary | ICD-10-CM | POA: Diagnosis not present

## 2015-09-06 DIAGNOSIS — M545 Low back pain: Secondary | ICD-10-CM | POA: Diagnosis not present

## 2015-09-06 DIAGNOSIS — M25571 Pain in right ankle and joints of right foot: Secondary | ICD-10-CM | POA: Diagnosis not present

## 2015-09-12 DIAGNOSIS — M545 Low back pain: Secondary | ICD-10-CM | POA: Diagnosis not present

## 2015-09-12 DIAGNOSIS — M25571 Pain in right ankle and joints of right foot: Secondary | ICD-10-CM | POA: Diagnosis not present

## 2015-10-02 DIAGNOSIS — N400 Enlarged prostate without lower urinary tract symptoms: Secondary | ICD-10-CM | POA: Diagnosis not present

## 2015-10-02 DIAGNOSIS — N3281 Overactive bladder: Secondary | ICD-10-CM | POA: Diagnosis not present

## 2015-10-02 DIAGNOSIS — N401 Enlarged prostate with lower urinary tract symptoms: Secondary | ICD-10-CM | POA: Diagnosis not present

## 2015-10-02 DIAGNOSIS — R351 Nocturia: Secondary | ICD-10-CM | POA: Diagnosis not present

## 2015-10-25 DIAGNOSIS — M1990 Unspecified osteoarthritis, unspecified site: Secondary | ICD-10-CM | POA: Diagnosis not present

## 2015-10-25 DIAGNOSIS — M5136 Other intervertebral disc degeneration, lumbar region: Secondary | ICD-10-CM | POA: Diagnosis not present

## 2015-10-25 DIAGNOSIS — Z6841 Body Mass Index (BMI) 40.0 and over, adult: Secondary | ICD-10-CM | POA: Diagnosis not present

## 2015-10-26 DIAGNOSIS — M545 Low back pain: Secondary | ICD-10-CM | POA: Diagnosis not present

## 2015-10-31 DIAGNOSIS — M545 Low back pain: Secondary | ICD-10-CM | POA: Diagnosis not present

## 2015-11-02 DIAGNOSIS — M25552 Pain in left hip: Secondary | ICD-10-CM | POA: Diagnosis not present

## 2015-11-02 DIAGNOSIS — M5442 Lumbago with sciatica, left side: Secondary | ICD-10-CM | POA: Diagnosis not present

## 2015-11-06 DIAGNOSIS — M4807 Spinal stenosis, lumbosacral region: Secondary | ICD-10-CM | POA: Diagnosis not present

## 2015-11-13 DIAGNOSIS — M47816 Spondylosis without myelopathy or radiculopathy, lumbar region: Secondary | ICD-10-CM | POA: Diagnosis not present

## 2015-11-16 DIAGNOSIS — M47816 Spondylosis without myelopathy or radiculopathy, lumbar region: Secondary | ICD-10-CM | POA: Diagnosis not present

## 2015-11-20 ENCOUNTER — Other Ambulatory Visit: Payer: Self-pay | Admitting: Diagnostic Neuroimaging

## 2015-12-04 DIAGNOSIS — H2513 Age-related nuclear cataract, bilateral: Secondary | ICD-10-CM | POA: Diagnosis not present

## 2015-12-04 DIAGNOSIS — H52203 Unspecified astigmatism, bilateral: Secondary | ICD-10-CM | POA: Diagnosis not present

## 2015-12-08 DIAGNOSIS — M19071 Primary osteoarthritis, right ankle and foot: Secondary | ICD-10-CM | POA: Diagnosis not present

## 2015-12-12 DIAGNOSIS — M47816 Spondylosis without myelopathy or radiculopathy, lumbar region: Secondary | ICD-10-CM | POA: Diagnosis not present

## 2015-12-28 DIAGNOSIS — M47816 Spondylosis without myelopathy or radiculopathy, lumbar region: Secondary | ICD-10-CM | POA: Diagnosis not present

## 2016-01-10 DIAGNOSIS — M47816 Spondylosis without myelopathy or radiculopathy, lumbar region: Secondary | ICD-10-CM | POA: Diagnosis not present

## 2016-01-16 DIAGNOSIS — K2270 Barrett's esophagus without dysplasia: Secondary | ICD-10-CM | POA: Diagnosis not present

## 2016-01-16 DIAGNOSIS — E785 Hyperlipidemia, unspecified: Secondary | ICD-10-CM | POA: Diagnosis not present

## 2016-01-16 DIAGNOSIS — I351 Nonrheumatic aortic (valve) insufficiency: Secondary | ICD-10-CM | POA: Diagnosis not present

## 2016-01-16 DIAGNOSIS — M1990 Unspecified osteoarthritis, unspecified site: Secondary | ICD-10-CM | POA: Diagnosis not present

## 2016-01-16 DIAGNOSIS — Z0000 Encounter for general adult medical examination without abnormal findings: Secondary | ICD-10-CM | POA: Diagnosis not present

## 2016-01-16 DIAGNOSIS — E039 Hypothyroidism, unspecified: Secondary | ICD-10-CM | POA: Diagnosis not present

## 2016-01-16 DIAGNOSIS — R5383 Other fatigue: Secondary | ICD-10-CM | POA: Diagnosis not present

## 2016-01-16 DIAGNOSIS — M47816 Spondylosis without myelopathy or radiculopathy, lumbar region: Secondary | ICD-10-CM | POA: Diagnosis not present

## 2016-01-16 DIAGNOSIS — N400 Enlarged prostate without lower urinary tract symptoms: Secondary | ICD-10-CM | POA: Diagnosis not present

## 2016-01-16 DIAGNOSIS — F0390 Unspecified dementia without behavioral disturbance: Secondary | ICD-10-CM | POA: Diagnosis not present

## 2016-01-18 DIAGNOSIS — G40309 Generalized idiopathic epilepsy and epileptic syndromes, not intractable, without status epilepticus: Secondary | ICD-10-CM | POA: Diagnosis not present

## 2016-01-18 DIAGNOSIS — I351 Nonrheumatic aortic (valve) insufficiency: Secondary | ICD-10-CM | POA: Diagnosis not present

## 2016-01-18 DIAGNOSIS — Z1389 Encounter for screening for other disorder: Secondary | ICD-10-CM | POA: Diagnosis not present

## 2016-01-18 DIAGNOSIS — Z0000 Encounter for general adult medical examination without abnormal findings: Secondary | ICD-10-CM | POA: Diagnosis not present

## 2016-01-18 DIAGNOSIS — Z6825 Body mass index (BMI) 25.0-25.9, adult: Secondary | ICD-10-CM | POA: Diagnosis not present

## 2016-01-18 DIAGNOSIS — M5136 Other intervertebral disc degeneration, lumbar region: Secondary | ICD-10-CM | POA: Diagnosis not present

## 2016-01-18 DIAGNOSIS — E784 Other hyperlipidemia: Secondary | ICD-10-CM | POA: Diagnosis not present

## 2016-01-18 DIAGNOSIS — K2270 Barrett's esophagus without dysplasia: Secondary | ICD-10-CM | POA: Diagnosis not present

## 2016-01-18 DIAGNOSIS — E038 Other specified hypothyroidism: Secondary | ICD-10-CM | POA: Diagnosis not present

## 2016-01-18 DIAGNOSIS — G308 Other Alzheimer's disease: Secondary | ICD-10-CM | POA: Diagnosis not present

## 2016-01-23 DIAGNOSIS — H25812 Combined forms of age-related cataract, left eye: Secondary | ICD-10-CM | POA: Diagnosis not present

## 2016-01-23 DIAGNOSIS — H2512 Age-related nuclear cataract, left eye: Secondary | ICD-10-CM | POA: Diagnosis not present

## 2016-02-01 ENCOUNTER — Ambulatory Visit (INDEPENDENT_AMBULATORY_CARE_PROVIDER_SITE_OTHER): Payer: Medicare Other | Admitting: Diagnostic Neuroimaging

## 2016-02-01 ENCOUNTER — Encounter: Payer: Self-pay | Admitting: Diagnostic Neuroimaging

## 2016-02-01 VITALS — BP 100/63 | HR 63 | Ht 66.0 in | Wt 153.2 lb

## 2016-02-01 DIAGNOSIS — R413 Other amnesia: Secondary | ICD-10-CM | POA: Diagnosis not present

## 2016-02-01 DIAGNOSIS — G40209 Localization-related (focal) (partial) symptomatic epilepsy and epileptic syndromes with complex partial seizures, not intractable, without status epilepticus: Secondary | ICD-10-CM

## 2016-02-01 MED ORDER — LEVETIRACETAM ER 750 MG PO TB24: 750.0000 mg | ORAL_TABLET | Freq: Every day | ORAL | Status: DC

## 2016-02-01 NOTE — Patient Instructions (Signed)

## 2016-02-01 NOTE — Progress Notes (Signed)
[facility]  PATIENT: [name] DOB: 1934-07-07  REFERRING CLINICIAN:  HISTORY FROM: patient REASON FOR VISIT: follow up   HISTORICAL  CHIEF COMPLAINT:  Chief Complaint  Patient presents with  . Follow-up    Memory. room 7 with wife. MMSE    HISTORY OF PRESENT ILLNESS:   UPDATE 02/01/16: Since last visit, doing about the same. No seizures . Memory issues stable.   UPDATE 04/27/15: Since last visit, still c/o memory issues. Mainly names and appts. Tries to write things down and and stay organized. Now living at [facility].   UPDATE 02/02/15: Since last visit, memory prob continue. No sz. More sleepy in daytime.   UPDATE 10/24/14: Since last visit, doing well. No sz. No spells. Memory loss is stable.   UPDATE 04/21/14: Since last visit, was doing well until 12/29/13, at coffee shop with friends, then had a 1 min episode of staring/memory loss. No convulsions or syncope. He thought this may have been a "mini sz" but he didn't contact our office. He contacted PCP, and was recommended to add LEV 250mg  in AM, in addition to his 500mg  XR tabs in the evening. Memory loss is stable. Still driving. Recently transitioned to retirement community.   UPDATE 05/27/13 (Dr. Rexene Alberts): 82 year old right-handed gentleman who presents for followup consultation of his complex partial seizure disorder. He is accompanied by his wife today. This is his first visit after Dr. Tressia Danas retirement and he was last seen by Dr. Jeneen Rinks love on 12/02/2012, at which time Dr. Erling Cruz did not change his antiseizure medication and felt hesitant to start donepezil for memory loss as it can lower seizure threshold. He suggested brain MRI and lab work including [place] level. His MMSE at that time was 27/30, clock drawing was 4, animal fluency was 9. The patient has an underlying medical history of asthma, esophagitis, thyroid disease, arthritis status post right hip replacement surgery in 2005 and seizure disorder. He  is currently on pravastatin, B12, axona, omeprazole, Synthroid, Advair, Keppra, vitamin B6, multivitamin.  PRIOR HPI (12/02/12, Dr. Erling Cruz): 82 year old right-handed white married male with complex partial seizures first diagnosed in 1983 with normal CAT scan and MRI study of the brain but two EEGs showing evidence of left temporal lobe epileptiform activity. He developed side effects on Dilantin which was discontinued in 1986. He  had 2 simple partial seizures and Dilantin was reinstituted. Dilantin was changed to Tegretol but he developed lethargy on Tegretol and was tapered off.He had a seizure 05/10/1997 and Tegretol was restarted.He was tried of Depakote, but was tapered off Depakote at [facility]. In 2006 he began having episodes of strange feeling lasting 20-30 seconds with dj vu and memory loss. During these episodes he would be unresponsive according his wife who would shake him. MRI 06/05/2005 was normal except for mild atrophy and EEG 10/11/2005 was normal. He was placed on Lamictal but did not tolerate this well and continued to have seizures on 300 mg per day. He had a witnessed seizure 08/26/2006 in [place] office. He was slowly changed to [place] and has not had recurrent seizures but has had poor tolerance with sleepiness. He was changed from 500 mg twice per day to 500 mg ER twice daily and subsequently 250 mg levetiracetam 3 times per day. He is now on levetiracetam 500 mg ER once daily . I saw him 06/19/09 for episodes of vague sensation with spinning lasting 20-30 seconds. These would occur sitting, lying, standing, and when turning over in bed.  There was no loss of consciousness or aura of macropsia, micropsia, dj vu, strange odors or  tastes. His examination was normal with negative Dix-Hallpike hanging head test. He started  vertigo exercises and his symptoms resolved.  If he does not do his exercises the symptoms recur. He complains of memory loss He has been evaluated 06/2007 for memory  loss with a neuropsychological battery showing superior intelligence, but loss of short-term and delayed memory. His mother had memory loss beginning in her 55s. He is able to pay the bills and right the checks.He can't  remember what day it is. He has difficulty with peoples names that he has known for years. He takes naps for 15 minutes after breakfast, and one to 2 naps after lunch. He drinks coffee during the day, small amounts  because of his Barrett's esophagitis which he states helps his memory. He does not snore at night. He awakens refreshed in the mornings. He is independent in activities of daily. 12/03/2011=(MMSE 26/30. CDT 4/4. AFT 12. [name] Index of independence in activities of daily living 6. [place]-Brody instrumental activities of daily living scale 7. Neuropsychiatric inventory=0. Geriatric depression scale 2/15. Falls assessment tool score 5.CBC, CMP,  lipid profile, and TSH normal 11/13/11. He has not had recurrent seizures. He denies macropsia, micropsia, strange odors or tastes.He complains of daytime sleepiness and takeslevetiracetam 500 mg ER once at night. He complains of worsening long term memory, particularly noticed after gathering of classmates from his college days. He is performing [email or web address] and an exercise program with  improvement  in his memory He exercises by doing calisthenics 5 times per week, but  injured his right ankle many years ago when he fell off of a tank and has recurrent right ankle pain, uses a brace. 12/02/2012=( MMSE 27/30. CDT 4/4. AFT 9. [name] 6. [place] 7. Neuropsychiatric inventory=0.  Geriatric depression scale 1/15 Falls assessment tool score 5).   REVIEW OF SYSTEMS: Full 14 system review of systems performed and notable only for memory loss daytime sleepiness leg swelling.   ALLERGIES: Allergies  Allergen Reactions  . Lamotrigine Other (See Comments)    Drowsiness  . Sulfonamide Derivatives     As a child    HOME MEDICATIONS: Outpatient  Prescriptions Prior to Visit  Medication Sig Dispense Refill  . acetaminophen (TYLENOL) 500 MG tablet Take 500 mg by mouth every 6 (six) hours as needed.    Marland Kitchen Apoaequorin (PREVAGEN) 10 MG CAPS Take 10 mg by mouth every morning.    . cromolyn (NASALCROM) 5.2 MG/ACT nasal spray Place 1 spray into both nostrils 2 (two) times daily.    Marland Kitchen donepezil (ARICEPT) 10 MG tablet Take 10 mg by mouth at bedtime.    . finasteride (PROSCAR) 5 MG tablet Take 1 tablet by mouth daily.  0  . Levetiracetam 750 MG TB24 TAKE ONE TABLET AT BEDTIME 90 each 4  . levothyroxine (SYNTHROID, LEVOTHROID) 100 MCG tablet Take 100 mcg by mouth daily before breakfast.    . Multiple Vitamin (MULTIVITAMIN) tablet Take 1 tablet by mouth daily.      Marland Kitchen omeprazole (PRILOSEC) 20 MG capsule Take 20 mg by mouth daily.      . pravastatin (PRAVACHOL) 20 MG tablet Take 1 tablet by mouth daily.    . Pyridoxine HCl (VITAMIN B-6) 500 MG tablet Take 500 mg by mouth daily.      . traMADol (ULTRAM) 50 MG tablet   0  . vitamin B-12 (CYANOCOBALAMIN) 1000 MCG tablet Take 1,000 mcg  by mouth daily.      . VOLTAREN 1 % GEL   0   No facility-administered medications prior to visit.    PAST MEDICAL HISTORY: Past Medical History  Diagnosis Date  . Seizure disorder ([place])   . Esophageal reflux   . Allergic rhinitis   . Asthma   . Other and unspecified hyperlipidemia   . Memory loss   . Unspecified hypothyroidism   . Dizziness and giddiness   . Depressive disorder, not elsewhere classified   . 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 Barrett syndrome     PAST SURGICAL HISTORY: Past Surgical History  Procedure Laterality Date  . Appendectomy    . Right hip replacement  2005    FAMILY HISTORY: Family History  Problem Relation Age of Onset  . Heart disease Mother   . Heart disease Father   . Asthma Sister   . Memory loss Mother     SOCIAL  HISTORY:  Social History   Social History  . Marital Status: Married    Spouse Name: [name]  . Number of Children: 2  . Years of Education: BA   Occupational History  . retired Arboriculturist   .     Social History Main Topics  . Smoking status: Never Smoker   . Smokeless tobacco: Never Used  . Alcohol Use: No  . Drug Use: No  . Sexual Activity: Not on file   Other Topics Concern  . Not on file   Social History Narrative   Patient lives at home with family. ([place])   Caffeine Use: up to 3 cups daily     PHYSICAL EXAM  Filed Vitals:   02/01/16 1359  BP: 100/63  Pulse: 63  Height: 5\' 6"  (1.676 m)  Weight: 153 lb 3.2 oz (69.491 kg)    Not recorded     Body mass index is 24.74 kg/(m^2).  MMSE - Mini Mental State Exam 02/01/2016 04/27/2015 02/27/2015  Orientation to time 4 3 4   Orientation to Place 5 5 4   Registration 3 3 3   Attention/ Calculation 5 5 2   Recall 1 3 2   Language- name 2 objects 2 2 2   Language- repeat 1 1 1   Language- follow 3 step command 3 3 2   Language- read & follow direction 1 1 1   Write a sentence 1 1 1   Copy design 1 1 1   Total score 27 28 23      GENERAL EXAM: Patient is in no distress; well developed, nourished and groomed; neck is supple  CARDIOVASCULAR: Regular rate and rhythm, no murmurs, no carotid bruits  NEUROLOGIC: MENTAL STATUS: awake, alert, language fluent, comprehension intact, naming intact, fund of knowledge appropriate.  CRANIAL NERVE: pupils equal and reactive to light, visual fields full to confrontation, extraocular muscles intact, no nystagmus, facial sensation and strength symmetric, hearing intact, palate elevates symmetrically, uvula midline, shoulder shrug symmetric, tongue midline. MOTOR: normal bulk and tone, full strength in the BUE, BLE SENSORY: normal and symmetric to light touch COORDINATION: finger-nose-finger, fine finger movements normal REFLEXES: deep tendon reflexes present and  symmetric GAIT/STATION: SLOW, LIMPING GAIT. ABLE TO TANDEM; STOOPED POSTURE.     DIAGNOSTIC DATA (LABS, IMAGING, TESTING) - I reviewed patient records, labs, notes, testing and imaging myself where available.  No results found for: WBC No results found for: NA No results found for: CHOL No results found for: HGBA1C No results found for: VITAMINB12 No  results found for: TSH  12/12/12 MRI brain - Mild generalized cerebral atrophy. No significant changes compared with prior MRI scan dated 01/09/2010.  09/25/05 EEG - normal   ASSESSMENT AND PLAN  82 y.o. year old male here with complex partial seizures and memory loss. May have had a seizure vs memory lapse spell in March 2015. No more events since on LEV 750mg  daily. Memory loss stable, possible MCI, but not likely neurodegenerative dementia.   Dx:  Memory loss  Partial symptomatic epilepsy with complex partial seizures, not intractable, without status epilepticus (HCC)    PLAN: - Continue LEV XR 750mg  qhs. - Continue donepezil 10mg  (started per Dr. Philip Aspen) - Caution with driving - Continue exercise, mental and socially stimulating activities  Meds ordered this encounter  Medications  . Levetiracetam 750 MG TB24    Sig: Take 1 tablet (750 mg total) by mouth at bedtime.    Dispense:  90 each    Refill:  4   Return in about 1 year (around 01/31/2017).    Penni Bombard, MD [id number], [id number] PM Certified in Neurology, Neurophysiology and Neuroimaging  [facility] [address] [place], [place] [address] [phone]

## 2016-02-09 DIAGNOSIS — M47816 Spondylosis without myelopathy or radiculopathy, lumbar region: Secondary | ICD-10-CM | POA: Diagnosis not present

## 2016-03-11 DIAGNOSIS — K2270 Barrett's esophagus without dysplasia: Secondary | ICD-10-CM | POA: Diagnosis not present

## 2016-03-11 DIAGNOSIS — R143 Flatulence: Secondary | ICD-10-CM | POA: Diagnosis not present

## 2016-03-12 DIAGNOSIS — H2511 Age-related nuclear cataract, right eye: Secondary | ICD-10-CM | POA: Diagnosis not present

## 2016-03-12 DIAGNOSIS — H25811 Combined forms of age-related cataract, right eye: Secondary | ICD-10-CM | POA: Diagnosis not present

## 2016-04-08 DIAGNOSIS — Z85828 Personal history of other malignant neoplasm of skin: Secondary | ICD-10-CM | POA: Diagnosis not present

## 2016-04-08 DIAGNOSIS — L821 Other seborrheic keratosis: Secondary | ICD-10-CM | POA: Diagnosis not present

## 2016-04-08 DIAGNOSIS — C44629 Squamous cell carcinoma of skin of left upper limb, including shoulder: Secondary | ICD-10-CM | POA: Diagnosis not present

## 2016-04-08 DIAGNOSIS — B351 Tinea unguium: Secondary | ICD-10-CM | POA: Diagnosis not present

## 2016-04-08 DIAGNOSIS — B078 Other viral warts: Secondary | ICD-10-CM | POA: Diagnosis not present

## 2016-05-01 ENCOUNTER — Other Ambulatory Visit: Payer: Self-pay | Admitting: Gastroenterology

## 2016-05-01 DIAGNOSIS — K2270 Barrett's esophagus without dysplasia: Secondary | ICD-10-CM | POA: Diagnosis not present

## 2016-05-01 DIAGNOSIS — K2950 Unspecified chronic gastritis without bleeding: Secondary | ICD-10-CM | POA: Diagnosis not present

## 2016-05-01 DIAGNOSIS — K317 Polyp of stomach and duodenum: Secondary | ICD-10-CM | POA: Diagnosis not present

## 2016-05-01 DIAGNOSIS — K449 Diaphragmatic hernia without obstruction or gangrene: Secondary | ICD-10-CM | POA: Diagnosis not present

## 2016-05-23 ENCOUNTER — Telehealth: Payer: Self-pay | Admitting: *Deleted

## 2016-05-23 NOTE — Telephone Encounter (Signed)
Spoke with [employer] who stated a PA form will be faxed. Once received back, they will call with the PA  determination.

## 2016-05-27 NOTE — Telephone Encounter (Signed)
Patient called in stating he needs a refill for his  Rx Levetiracetam 750 mg.  Thanks!

## 2016-05-27 NOTE — Telephone Encounter (Signed)
Spoke with Shanon Brow, pharmacist who stated PA for Levetiracetam has not been received.  Spoke with Roxy [employer] who stated the form still was not received. Form completed and placed on Dr [employer] desk.

## 2016-05-27 NOTE — Telephone Encounter (Signed)
Lowella Fairy, pharmacist back who stated patient has been taking generic form of levetiracetam. Completed form for PA faxed to [facility].

## 2016-05-27 NOTE — Telephone Encounter (Signed)
Patient returned call to advise, he's taking generic Levetiracetam 750 MG TB24

## 2016-05-27 NOTE — Telephone Encounter (Signed)
LVM requesting patient call back to inform whether he is taking brand or generic of Keppra. Advised phone staff may take information if this RN is not available. Left name, number.

## 2016-05-28 NOTE — Telephone Encounter (Signed)
[employer] [phone] called to advise medication has been approved for 1 yr effective 05/27/16.

## 2016-05-28 NOTE — Telephone Encounter (Signed)
Spoke with Shanon Brow, pharmacist to inform medication has been approved through 05/27/2017. He stated he refilled medication this morning.  He thanked this Therapist, sports and ended the call.

## 2016-06-03 ENCOUNTER — Encounter: Payer: Self-pay | Admitting: *Deleted

## 2016-06-10 ENCOUNTER — Telehealth: Payer: Self-pay | Admitting: Internal Medicine

## 2016-06-10 NOTE — Telephone Encounter (Signed)
error 

## 2016-07-17 DIAGNOSIS — Z23 Encounter for immunization: Secondary | ICD-10-CM | POA: Diagnosis not present

## 2016-08-21 DIAGNOSIS — R143 Flatulence: Secondary | ICD-10-CM | POA: Diagnosis not present

## 2016-08-21 DIAGNOSIS — R197 Diarrhea, unspecified: Secondary | ICD-10-CM | POA: Diagnosis not present

## 2016-08-22 DIAGNOSIS — R197 Diarrhea, unspecified: Secondary | ICD-10-CM | POA: Diagnosis not present

## 2016-09-19 DIAGNOSIS — R197 Diarrhea, unspecified: Secondary | ICD-10-CM | POA: Diagnosis not present

## 2016-09-30 DIAGNOSIS — N3281 Overactive bladder: Secondary | ICD-10-CM | POA: Diagnosis not present

## 2016-09-30 DIAGNOSIS — N401 Enlarged prostate with lower urinary tract symptoms: Secondary | ICD-10-CM | POA: Diagnosis not present

## 2016-09-30 DIAGNOSIS — R351 Nocturia: Secondary | ICD-10-CM | POA: Diagnosis not present

## 2016-11-29 DIAGNOSIS — E038 Other specified hypothyroidism: Secondary | ICD-10-CM | POA: Diagnosis not present

## 2016-11-29 DIAGNOSIS — G40309 Generalized idiopathic epilepsy and epileptic syndromes, not intractable, without status epilepticus: Secondary | ICD-10-CM | POA: Diagnosis not present

## 2016-11-29 DIAGNOSIS — R05 Cough: Secondary | ICD-10-CM | POA: Diagnosis not present

## 2016-11-29 DIAGNOSIS — K2270 Barrett's esophagus without dysplasia: Secondary | ICD-10-CM | POA: Diagnosis not present

## 2016-11-29 DIAGNOSIS — G308 Other Alzheimer's disease: Secondary | ICD-10-CM | POA: Diagnosis not present

## 2016-11-29 DIAGNOSIS — R634 Abnormal weight loss: Secondary | ICD-10-CM | POA: Diagnosis not present

## 2016-11-29 DIAGNOSIS — Z6824 Body mass index (BMI) 24.0-24.9, adult: Secondary | ICD-10-CM | POA: Diagnosis not present

## 2016-12-11 DIAGNOSIS — R197 Diarrhea, unspecified: Secondary | ICD-10-CM | POA: Diagnosis not present

## 2017-01-31 DIAGNOSIS — G308 Other Alzheimer's disease: Secondary | ICD-10-CM | POA: Diagnosis not present

## 2017-01-31 DIAGNOSIS — M1990 Unspecified osteoarthritis, unspecified site: Secondary | ICD-10-CM | POA: Diagnosis not present

## 2017-01-31 DIAGNOSIS — M25571 Pain in right ankle and joints of right foot: Secondary | ICD-10-CM | POA: Diagnosis not present

## 2017-01-31 DIAGNOSIS — Z6825 Body mass index (BMI) 25.0-25.9, adult: Secondary | ICD-10-CM | POA: Diagnosis not present

## 2017-01-31 DIAGNOSIS — K2270 Barrett's esophagus without dysplasia: Secondary | ICD-10-CM | POA: Diagnosis not present

## 2017-01-31 DIAGNOSIS — G40309 Generalized idiopathic epilepsy and epileptic syndromes, not intractable, without status epilepticus: Secondary | ICD-10-CM | POA: Diagnosis not present

## 2017-01-31 DIAGNOSIS — R05 Cough: Secondary | ICD-10-CM | POA: Diagnosis not present

## 2017-02-04 ENCOUNTER — Ambulatory Visit: Payer: Medicare Other | Admitting: Diagnostic Neuroimaging

## 2017-02-10 ENCOUNTER — Telehealth: Payer: Self-pay | Admitting: *Deleted

## 2017-02-10 ENCOUNTER — Ambulatory Visit (INDEPENDENT_AMBULATORY_CARE_PROVIDER_SITE_OTHER): Payer: Medicare Other

## 2017-02-10 ENCOUNTER — Encounter (INDEPENDENT_AMBULATORY_CARE_PROVIDER_SITE_OTHER): Payer: Self-pay | Admitting: Orthopedic Surgery

## 2017-02-10 ENCOUNTER — Ambulatory Visit (INDEPENDENT_AMBULATORY_CARE_PROVIDER_SITE_OTHER): Payer: Medicare Other | Admitting: Orthopedic Surgery

## 2017-02-10 VITALS — Ht 66.0 in | Wt 153.0 lb

## 2017-02-10 DIAGNOSIS — M19171 Post-traumatic osteoarthritis, right ankle and foot: Secondary | ICD-10-CM

## 2017-02-10 DIAGNOSIS — M25571 Pain in right ankle and joints of right foot: Secondary | ICD-10-CM

## 2017-02-10 MED ORDER — METHYLPREDNISOLONE ACETATE 40 MG/ML IJ SUSP
40.0000 mg | INTRAMUSCULAR | Status: AC | PRN
  Administered 2017-02-10: 40 mg via INTRA_ARTICULAR

## 2017-02-10 MED ORDER — LIDOCAINE HCL 1 % IJ SOLN
2.0000 mL | INTRAMUSCULAR | Status: AC | PRN
  Administered 2017-02-10: 2 mL

## 2017-02-10 NOTE — Telephone Encounter (Signed)
LMVM for pt to return call to reschedule appt from power outage.

## 2017-02-10 NOTE — Progress Notes (Signed)
Office Visit Note   Patient: [name]           Date of Birth: 1934/04/02           MRN: [id number] Visit Date: 02/10/2017              Requested by: Leanna Battles, MD [address] [place], [place] [address] PCP: Donnajean Lopes, MD  Chief Complaint  Patient presents with  . Right Ankle - Pain      HPI: Patient is an 83 year old gentleman who is seen in referral from Dr. Bevelyn Buckles for increasing pain and deformity of his traumatic right ankle injury. Patient states that he fell off a tank in 1963. Complains of pain around the ankle with weightbearing pain with sleeping he has used Voltaren gel with minimal relief he has tried a brace without relief. Occasionally takes oral anti-inflammatories. It is relieved with sitting and elevation.  Assessment & Plan: Visit Diagnoses:  1. Pain in right ankle and joints of right foot   2. Post-traumatic osteoarthritis, right ankle and foot     Plan: The right ankle was injected from the anterior medial portal he tolerated this well follow-up in 3 weeks. Discussed that if he is still symptomatic a arthroscopic ankle fusion would be his best option. If he gets interval relief with the injection patient could have serial injections for the ankle. I anticipate in the long run his best option would be an arthroscopic fusion.  Follow-Up Instructions: Return in about 3 weeks (around 03/03/2017).   Ortho Exam  Patient is alert, oriented, no adenopathy, well-dressed, normal affect, normal respiratory effort. Patient has antalgic gait he has varicose veins has a good dorsalis pedis pulse he has dorsiflexion of the ankle about 10 short of neutral. The sinus Tarsi is nontender to palpation he has no pain with inversion or eversion of the ankle. He has limited range of motion of the ankle and has pain to palpation anteriorly. He has pain with plantarflexion and dorsiflexion.  Imaging: Xr Ankle Complete Right  Result Date:  02/10/2017 Three-view radiographs of the right ankle shows advanced traumatic osteoarthritis of the right ankle with bone-on-bone contact subcondylar sclerosis and cysts as well as bony spurs. There is also some degenerative changes of the subtalar joint.   Labs: No results found for: HGBA1C, ESRSEDRATE, CRP, LABURIC, REPTSTATUS, GRAMSTAIN, CULT, LABORGA  Orders:  Orders Placed This Encounter  Procedures  . XR Ankle Complete Right   No orders of the defined types were placed in this encounter.    Procedures: Medium Joint Inj Date/Time: 02/10/2017 11:30 AM Performed by: Whitley Strycharz V Authorized by: Newt Minion   Consent Given by:  Patient Site marked: the procedure site was marked   Timeout: prior to procedure the correct patient, procedure, and site was verified   Indications:  Pain and diagnostic evaluation Location:  Ankle Site:  R ankle Prep: patient was prepped and draped in usual sterile fashion   Needle Size:  22 G Needle Length:  1.5 inches Approach:  Anteromedial Ultrasound Guided: No   Fluoroscopic Guidance: No   Medications:  2 mL lidocaine 1 %; 40 mg methylPREDNISolone acetate 40 MG/ML Aspiration Attempted: No   Patient tolerance:  Patient tolerated the procedure well with no immediate complications    Clinical Data: No additional findings.  ROS:  All other systems negative, except as noted in the HPI. Review of Systems  Objective: Vital Signs: Ht 5\' 6"  (1.676 m)   Wt  153 lb (69.4 kg)   BMI 24.69 kg/m   Specialty Comments:  No specialty comments available.  PMFS History: Patient Active Problem List   Diagnosis Date Noted  . ACUTE BRONCHITIS 09/05/2009  . ALLERGIC RHINITIS 09/26/2007  . Asthma with bronchitis 09/26/2007  . ESOPHAGEAL REFLUX 09/26/2007  . SEIZURE DISORDER 09/26/2007   Past Medical History:  Diagnosis Date  . Allergic rhinitis   . Asthma   . Barrett syndrome   . Depressive disorder, not elsewhere classified   .  Dizziness and giddiness   . Esophageal reflux   . 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 Memory loss   . Other and unspecified hyperlipidemia   . Seizure disorder ([place])   . Unspecified hypothyroidism     Family History  Problem Relation Age of Onset  . Heart disease Mother   . Memory loss Mother   . Heart disease Father   . Asthma Sister     Past Surgical History:  Procedure Laterality Date  . APPENDECTOMY    . right hip replacement  2005   Social History   Occupational History  . retired Arboriculturist   .  Retired   Social History Main Topics  . Smoking status: Never Smoker  . Smokeless tobacco: Never Used  . Alcohol use No  . Drug use: No  . Sexual activity: Not on file

## 2017-02-13 NOTE — Telephone Encounter (Signed)
Appt rescheduled 03-03-17 at 1400.

## 2017-02-19 ENCOUNTER — Other Ambulatory Visit: Payer: Self-pay | Admitting: Diagnostic Neuroimaging

## 2017-03-03 ENCOUNTER — Ambulatory Visit (INDEPENDENT_AMBULATORY_CARE_PROVIDER_SITE_OTHER): Payer: Medicare Other | Admitting: Diagnostic Neuroimaging

## 2017-03-03 ENCOUNTER — Encounter: Payer: Self-pay | Admitting: Diagnostic Neuroimaging

## 2017-03-03 ENCOUNTER — Encounter (INDEPENDENT_AMBULATORY_CARE_PROVIDER_SITE_OTHER): Payer: Self-pay | Admitting: Orthopedic Surgery

## 2017-03-03 ENCOUNTER — Ambulatory Visit (INDEPENDENT_AMBULATORY_CARE_PROVIDER_SITE_OTHER): Payer: Medicare Other | Admitting: Orthopedic Surgery

## 2017-03-03 VITALS — BP 115/66 | HR 61 | Wt 152.0 lb

## 2017-03-03 DIAGNOSIS — M19171 Post-traumatic osteoarthritis, right ankle and foot: Secondary | ICD-10-CM | POA: Insufficient documentation

## 2017-03-03 DIAGNOSIS — M545 Low back pain, unspecified: Secondary | ICD-10-CM | POA: Insufficient documentation

## 2017-03-03 DIAGNOSIS — M25571 Pain in right ankle and joints of right foot: Secondary | ICD-10-CM | POA: Insufficient documentation

## 2017-03-03 DIAGNOSIS — R413 Other amnesia: Secondary | ICD-10-CM

## 2017-03-03 DIAGNOSIS — G40209 Localization-related (focal) (partial) symptomatic epilepsy and epileptic syndromes with complex partial seizures, not intractable, without status epilepticus: Secondary | ICD-10-CM | POA: Diagnosis not present

## 2017-03-03 HISTORY — DX: Pain in right ankle and joints of right foot: M25.571

## 2017-03-03 HISTORY — DX: Post-traumatic osteoarthritis, right ankle and foot: M19.171

## 2017-03-03 MED ORDER — LEVETIRACETAM ER 750 MG PO TB24: 750.0000 mg | ORAL_TABLET | Freq: Every day | ORAL | 4 refills | Status: DC

## 2017-03-03 NOTE — Progress Notes (Signed)
[facility]  PATIENT: [name] DOB: March 25, 1934  REFERRING CLINICIAN:  HISTORY FROM: patient REASON FOR VISIT: follow up   HISTORICAL  CHIEF COMPLAINT:  Chief Complaint  Patient presents with  . Memory Loss    rm 6, wifeLavonna Brown, MMSE 25  . Follow-up    one year    HISTORY OF PRESENT ILLNESS:   UPDATE 03/03/17: Since last visit, doing about the same, with some mild worsening of memory loss issues.  UPDATE 02/01/16: Since last visit, doing about the same. No seizures . Memory issues stable.   UPDATE 04/27/15: Since last visit, still c/o memory issues. Mainly names and appts. Tries to write things down and and stay organized. Now living at [facility].   UPDATE 02/02/15: Since last visit, memory prob continue. No sz. More sleepy in daytime.   UPDATE 10/24/14: Since last visit, doing well. No sz. No spells. Memory loss is stable.   UPDATE 04/21/14: Since last visit, was doing well until 12/29/13, at coffee shop with friends, then had a 1 min episode of staring/memory loss. No convulsions or syncope. He thought this may have been a "mini sz" but he didn't contact our office. He contacted PCP, and was recommended to add LEV 250mg  in AM, in addition to his 500mg  XR tabs in the evening. Memory loss is stable. Still driving. Recently transitioned to retirement community.   UPDATE 05/27/13 (Dr. Rexene Alberts): 83 year old right-handed gentleman who presents for followup consultation of his complex partial seizure disorder. He is accompanied by his wife today. This is his first visit after Dr. Tressia Danas retirement and he was last seen by Dr. Jeneen Rinks love on 12/02/2012, at which time Dr. Erling Cruz did not change his antiseizure medication and felt hesitant to start donepezil for memory loss as it can lower seizure threshold. He suggested brain MRI and lab work including [place] level. His MMSE at that time was 27/30, clock drawing was 4, animal fluency was 9. The patient has an underlying medical  history of asthma, esophagitis, thyroid disease, arthritis status post right hip replacement surgery in 2005 and seizure disorder. He is currently on pravastatin, B12, axona, omeprazole, Synthroid, Advair, Keppra, vitamin B6, multivitamin.  PRIOR HPI (12/02/12, Dr. Erling Cruz): 83 year old right-handed white married male with complex partial seizures first diagnosed in 1983 with normal CAT scan and MRI study of the brain but two EEGs showing evidence of left temporal lobe epileptiform activity. He developed side effects on Dilantin which was discontinued in 1986. He  had 2 simple partial seizures and Dilantin was reinstituted. Dilantin was changed to Tegretol but he developed lethargy on Tegretol and was tapered off.He had a seizure 05/10/1997 and Tegretol was restarted.He was tried of Depakote, but was tapered off Depakote at [facility]. In 2006 he began having episodes of strange feeling lasting 20-30 seconds with dj vu and memory loss. During these episodes he would be unresponsive according his wife who would shake him. MRI 06/05/2005 was normal except for mild atrophy and EEG 10/11/2005 was normal. He was placed on Lamictal but did not tolerate this well and continued to have seizures on 300 mg per day. He had a witnessed seizure 08/26/2006 in [place] office. He was slowly changed to [place] and has not had recurrent seizures but has had poor tolerance with sleepiness. He was changed from 500 mg twice per day to 500 mg ER twice daily and subsequently 250 mg levetiracetam 3 times per day. He is now on levetiracetam 500 mg ER once daily .  I saw him 06/19/09 for episodes of vague sensation with spinning lasting 20-30 seconds. These would occur sitting, lying, standing, and when turning over in bed. There was no loss of consciousness or aura of macropsia, micropsia, dj vu, strange odors or  tastes. His examination was normal with negative Dix-Hallpike hanging head test. He started  vertigo exercises and his symptoms  resolved.  If he does not do his exercises the symptoms recur. He complains of memory loss He has been evaluated 06/2007 for memory loss with a neuropsychological battery showing superior intelligence, but loss of short-term and delayed memory. His mother had memory loss beginning in her 28s. He is able to pay the bills and right the checks.He can't  remember what day it is. He has difficulty with peoples names that he has known for years. He takes naps for 15 minutes after breakfast, and one to 2 naps after lunch. He drinks coffee during the day, small amounts  because of his Barrett's esophagitis which he states helps his memory. He does not snore at night. He awakens refreshed in the mornings. He is independent in activities of daily. 12/03/2011=(MMSE 26/30. CDT 4/4. AFT 12. [name] Index of independence in activities of daily living 6. Lawton-Brody instrumental activities of daily living scale 7. Neuropsychiatric inventory=0. Geriatric depression scale 2/15. Falls assessment tool score 5.CBC, CMP,  lipid profile, and TSH normal 11/13/11. He has not had recurrent seizures. He denies macropsia, micropsia, strange odors or tastes.He complains of daytime sleepiness and takeslevetiracetam 500 mg ER once at night. He complains of worsening long term memory, particularly noticed after gathering of classmates from his college days. He is performing [email or web address] and an exercise program with  improvement  in his memory He exercises by doing calisthenics 5 times per week, but  injured his right ankle many years ago when he fell off of a tank and has recurrent right ankle pain, uses a brace. 12/02/2012=( MMSE 27/30. CDT 4/4. AFT 9. [name] 6. Lawton-Brody 7. Neuropsychiatric inventory=0.  Geriatric depression scale 1/15 Falls assessment tool score 5).   REVIEW OF SYSTEMS: Full 14 system review of systems performed and negative except: memory loss back pain.    ALLERGIES: Allergies  Allergen Reactions  . Dilantin [Phenytoin  Sodium Extended]   . Lamotrigine Other (See Comments)    Drowsiness  . Sulfonamide Derivatives     As a child    HOME MEDICATIONS: Outpatient Medications Prior to Visit  Medication Sig Dispense Refill  . acetaminophen (TYLENOL) 500 MG tablet Take 500 mg by mouth every 6 (six) hours as needed.    Marland Kitchen Apoaequorin (PREVAGEN) 10 MG CAPS Take 10 mg by mouth every morning.    . donepezil (ARICEPT) 10 MG tablet Take 10 mg by mouth at bedtime.    . finasteride (PROSCAR) 5 MG tablet Take 1 tablet by mouth daily.  0  . Levetiracetam 750 MG TB24 TAKE ONE TABLET AT BEDTIME 90 each 4  . levothyroxine (SYNTHROID, LEVOTHROID) 100 MCG tablet Take 100 mcg by mouth daily before breakfast.    . Loperamide-Simethicone 2-125 MG TABS Take by mouth.    . memantine (NAMENDA) 5 MG tablet Take 5 mg by mouth 2 (two) times daily.    . Multiple Vitamin (MULTIVITAMIN) tablet Take 1 tablet by mouth daily.      Marland Kitchen omeprazole (PRILOSEC) 20 MG capsule Take 20 mg by mouth daily.      . pravastatin (PRAVACHOL) 20 MG tablet Take 1 tablet by mouth daily.    Marland Kitchen  Probiotic Product (ALIGN PO) Take by mouth.    . Pyridoxine HCl (VITAMIN B-6) 500 MG tablet Take 500 mg by mouth daily.      . traMADol (ULTRAM) 50 MG tablet   0  . vitamin B-12 (CYANOCOBALAMIN) 1000 MCG tablet Take 1,000 mcg by mouth daily.      . VOLTAREN 1 % GEL   0  . cromolyn (NASALCROM) 5.2 MG/ACT nasal spray Place 1 spray into both nostrils 2 (two) times daily.     No facility-administered medications prior to visit.     PAST MEDICAL HISTORY: Past Medical History:  Diagnosis Date  . Allergic rhinitis   . Asthma   . Barrett syndrome   . Depressive disorder, not elsewhere classified   . Dizziness and giddiness   . Esophageal reflux   . 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 Memory loss   . Other and unspecified hyperlipidemia   . Seizure disorder ([place])   .  Unspecified hypothyroidism     PAST SURGICAL HISTORY: Past Surgical History:  Procedure Laterality Date  . APPENDECTOMY    . right hip replacement  2005    FAMILY HISTORY: Family History  Problem Relation Age of Onset  . Heart disease Mother   . Memory loss Mother   . Heart disease Father   . Asthma Sister   . Parkinson's disease Sister     SOCIAL HISTORY:  Social History   Social History  . Marital status: Married    Spouse name: Eugenia  . Number of children: 2  . Years of education: BA   Occupational History  . retired Arboriculturist   .  Retired   Social History Main Topics  . Smoking status: Never Smoker  . Smokeless tobacco: Never Used  . Alcohol use No  . Drug use: No  . Sexual activity: Not on file   Other Topics Concern  . Not on file   Social History Narrative   Patient lives at home with family. ([place])   Caffeine Use: up to 3 cups daily     PHYSICAL EXAM  Vitals:   03/03/17 1343  BP: 115/66  Pulse: 61  Weight: 152 lb (68.9 kg)    Not recorded     Body mass index is 24.53 kg/m.  MMSE - Mini Mental State Exam 03/03/2017 02/01/2016 04/27/2015  Orientation to time 5 4 3   Orientation to Place 4 5 5   Registration 3 3 3   Attention/ Calculation 4 5 5   Recall 0 1 3  Language- name 2 objects 2 2 2   Language- repeat 1 1 1   Language- follow 3 step command 3 3 3   Language- read & follow direction 1 1 1   Write a sentence 1 1 1   Copy design 1 1 1   Total score 25 27 28      GENERAL EXAM: Patient is in no distress; well developed, nourished and groomed; neck is supple  CARDIOVASCULAR: Regular rate and rhythm, no murmurs, no carotid bruits  NEUROLOGIC: MENTAL STATUS: awake, alert, language fluent, comprehension intact, naming intact, fund of knowledge appropriate.  CRANIAL NERVE: pupils equal and reactive to light, visual fields full to confrontation, extraocular muscles intact, no nystagmus, facial sensation and strength symmetric,  hearing intact, palate elevates symmetrically, uvula midline, shoulder shrug symmetric, tongue midline. MOTOR: normal bulk and tone, full strength in the BUE, BLE SENSORY: normal and symmetric to light touch COORDINATION: finger-nose-finger, fine finger movements  normal REFLEXES: deep tendon reflexes present and symmetric GAIT/STATION: SLOW, LIMPING GAIT. ABLE TO TANDEM; STOOPED POSTURE.     DIAGNOSTIC DATA (LABS, IMAGING, TESTING) - I reviewed patient records, labs, notes, testing and imaging myself where available.  No results found for: WBC No results found for: NA No results found for: CHOL No results found for: HGBA1C No results found for: VITAMINB12 No results found for: TSH  12/12/12 MRI brain - Mild generalized cerebral atrophy. No significant changes compared with prior MRI scan dated 01/09/2010.  09/25/05 EEG - normal   ASSESSMENT AND PLAN  83 y.o. year old male here with complex partial seizures and memory loss. May have had a seizure vs memory lapse spell in March 2015. No more events since on LEV 750mg  daily. Memory loss stable, possible MCI, but not likely neurodegenerative dementia.    Dx:  Memory loss  Partial symptomatic epilepsy with complex partial seizures, not intractable, without status epilepticus (HCC)    PLAN: - Continue LEV XR 750mg  qhs - Continue donepezil 10mg  (started per Dr. Philip Aspen) - Caution with driving - Continue exercise, mental and socially stimulating activities - Gave information of clinic research studies; they are interested  Meds ordered this encounter  Medications  . Levetiracetam 750 MG TB24    Sig: Take 1 tablet (750 mg total) by mouth at bedtime.    Dispense:  90 each    Refill:  4   Return in about 4 months (around 07/04/2017).    Penni Bombard, MD 3/78/5885, 0:27 PM Certified in Neurology, Neurophysiology and Neuroimaging  [facility] [address] [place],  [address] [phone]

## 2017-03-03 NOTE — Progress Notes (Signed)
Office Visit Note   Patient: [name]           Date of Birth: 08-03-1934           MRN: [id number] Visit Date: 03/03/2017              Requested by: Leanna Battles, [place], [place] [address] PCP: Leanna Battles, MD  Chief Complaint  Patient presents with  . Right Ankle - Pain, Follow-up  . Lower Back - Pain      HPI: Patient is an 83 year old gentleman presents in follow-up for traumatic arthritis right ankle as well as acute left-sided radicular pain. Patient states the injection did help his ankle and he is anxious to begin exercising. Patient states he was in a car traveling to [place] and was in the car for almost 8 hours. Patient complains of increasing left-sided radicular pain after the car ride.  Assessment & Plan: Visit Diagnoses:  1. Pain in right ankle and joints of right foot   2. Acute left-sided low back pain without sciatica   3. Post-traumatic osteoarthritis, right ankle and foot     Plan: Recommended walking exercise strengthening Aleve 2 by mouth twice a day as needed for inflammation and pain. Follow-up as needed. Discussed that we could proceed with an additional injection for his ankle if necessary.  Follow-Up Instructions: Return if symptoms worsen or fail to improve.   Ortho Exam  Patient is alert, oriented, no adenopathy, well-dressed, normal affect, normal respiratory effort. Examination patient has an antalgic gait. Examination he has a negative straight leg raise bilaterally no pain with range of motion of the left hip knee or ankle. No focal motor weakness in either lower extremity.  Imaging: No results found.  Labs: No results found for: HGBA1C, ESRSEDRATE, CRP, LABURIC, REPTSTATUS, GRAMSTAIN, CULT, LABORGA  Orders:  No orders of the defined types were placed in this encounter.  No orders of the defined types were placed in this encounter.    Procedures: No procedures performed  Clinical Data: No additional  findings.  ROS:  All other systems negative, except as noted in the HPI. Review of Systems  Objective: Vital Signs: There were no vitals taken for this visit.  Specialty Comments:  No specialty comments available.  PMFS History: Patient Active Problem List   Diagnosis Date Noted  . Pain in right ankle and joints of right foot 03/03/2017  . Acute left-sided low back pain without sciatica 03/03/2017  . Post-traumatic osteoarthritis, right ankle and foot 03/03/2017  . ACUTE BRONCHITIS 09/05/2009  . ALLERGIC RHINITIS 09/26/2007  . Asthma with bronchitis 09/26/2007  . ESOPHAGEAL REFLUX 09/26/2007  . SEIZURE DISORDER 09/26/2007   Past Medical History:  Diagnosis Date  . Allergic rhinitis   . Asthma   . Barrett syndrome   . Depressive disorder, not elsewhere classified   . Dizziness and giddiness   . Esophageal reflux   . 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 Memory loss   . Other and unspecified hyperlipidemia   . Seizure disorder ([place])   . Unspecified hypothyroidism     Family History  Problem Relation Age of Onset  . Heart disease Mother   . Memory loss Mother   . Heart disease Father   . Asthma Sister     Past Surgical History:  Procedure Laterality Date  . APPENDECTOMY    . right hip replacement  2005  Social History   Occupational History  . retired Arboriculturist   .  Retired   Social History Main Topics  . Smoking status: Never Smoker  . Smokeless tobacco: Never Used  . Alcohol use No  . Drug use: No  . Sexual activity: Not on file

## 2017-03-05 ENCOUNTER — Telehealth (INDEPENDENT_AMBULATORY_CARE_PROVIDER_SITE_OTHER): Payer: Self-pay | Admitting: Orthopedic Surgery

## 2017-03-05 NOTE — Telephone Encounter (Signed)
PT REQUESTED A CALL BACK REGARDING HIS BACK. HE WANTS TO KNOW WHAT TO DO NEXT AS HE IS STILL IN PAIN.  [phone]

## 2017-03-06 NOTE — Telephone Encounter (Signed)
I called and left patient a message advising I did not see where he was worked up for his back. No imaging done, he would need in office evaluation for his back. As far as his ankle goes Dr. Sharol Given had suggested serial injections in ankle. Advised to call back and get worked in to [employer] schedule today.

## 2017-03-13 ENCOUNTER — Ambulatory Visit (INDEPENDENT_AMBULATORY_CARE_PROVIDER_SITE_OTHER): Payer: Medicare Other | Admitting: Family

## 2017-03-19 ENCOUNTER — Ambulatory Visit (INDEPENDENT_AMBULATORY_CARE_PROVIDER_SITE_OTHER): Payer: Medicare Other

## 2017-03-19 ENCOUNTER — Encounter (INDEPENDENT_AMBULATORY_CARE_PROVIDER_SITE_OTHER): Payer: Self-pay | Admitting: Family

## 2017-03-19 ENCOUNTER — Ambulatory Visit (INDEPENDENT_AMBULATORY_CARE_PROVIDER_SITE_OTHER): Payer: Medicare Other | Admitting: Family

## 2017-03-19 VITALS — Ht 66.0 in | Wt 152.0 lb

## 2017-03-19 DIAGNOSIS — M5442 Lumbago with sciatica, left side: Secondary | ICD-10-CM

## 2017-03-19 DIAGNOSIS — M19171 Post-traumatic osteoarthritis, right ankle and foot: Secondary | ICD-10-CM

## 2017-03-19 NOTE — Progress Notes (Signed)
Office Visit Note   Patient: [name]           Date of Birth: [birth year]           MRN: [id number] Visit Date: 03/19/2017              Requested by: Leanna Battles, [address] [place], [place] [address] PCP: Leanna Battles, MD  Chief Complaint  Patient presents with  . Lower Back - Pain    Left posterior hip pain, denies any groin pain.       HPI: The patient is an 83 year old gentleman who presents today for 2 separate issues. He is seen in follow-up for traumatic arthritis and ankle pain on the right. He is status post Depo-Medrol injection on April 23 of this year. States this provided him with good relief. Does continue to have some chronic pain. States he can live with this. Is satisfied with current treatment and pain.  Today is complaining of some low back pain with radicular symptoms down the left. Posterior hip/buttock pain on the left. States that this has been going on for a few weeks but within the last few days has subsided. Denies any groin pain. Has decreased his activities and is resting is unsure if this is why his pain has subsided. Denies any numbness or tingling down the left lower extremity. No known injury. No history of previous back or left lower extremities pain.  States takes ibuprofen twice daily for his ankle and this has been helpful with his back pain.  Assessment & Plan: Visit Diagnoses:  1. Acute left-sided low back pain with left-sided sciatica   2. Post-traumatic osteoarthritis, right ankle and foot     Plan: We'll continue with conservative measures. Recommended he continue the ibuprofen. Declined prednisone today. He will resume his morning calisthenics workout daily. Will follow up with [place] in office as needed for the ankle or the back.  Follow-Up Instructions: Return if symptoms worsen or fail to improve.   Back Exam   Tenderness  The patient is experiencing tenderness in the lumbar.  Muscle Strength  The patient has  normal back strength.  Other  Gait: circumducted       Patient is alert, oriented, no adenopathy, well-dressed, normal affect, normal respiratory effort.   Imaging: Xr Lumbar Spine 2-3 Views  Result Date: 03/19/2017 Radiographs of the lumbar spine today show moderate scoliosis. There is anterior listhesis of L4. There are widespread degenerative changes, loss of disc space and with osteophytic bone spurring.    Labs: No results found for: HGBA1C, ESRSEDRATE, CRP, LABURIC, REPTSTATUS, GRAMSTAIN, CULT, LABORGA  Orders:  Orders Placed This Encounter  Procedures  . XR Lumbar Spine 2-3 Views   No orders of the defined types were placed in this encounter.    Procedures: No procedures performed  Clinical Data: No additional findings.  ROS:  All other systems negative, except as noted in the HPI. Review of Systems  Constitutional: Negative for chills and fever.  Cardiovascular: Negative for leg swelling.  Musculoskeletal: Positive for arthralgias and back pain. Negative for myalgias.    Objective: Vital Signs: Ht 5\' 6"  (1.676 m)   Wt 152 lb (68.9 kg)   BMI 24.53 kg/m   Specialty Comments:  No specialty comments available.  PMFS History: Patient Active Problem List   Diagnosis Date Noted  . Pain in right ankle and joints of right foot 03/03/2017  . Acute left-sided low back pain without sciatica 03/03/2017  .  Post-traumatic osteoarthritis, right ankle and foot 03/03/2017  . ACUTE BRONCHITIS 09/05/2009  . ALLERGIC RHINITIS 09/26/2007  . Asthma with bronchitis 09/26/2007  . ESOPHAGEAL REFLUX 09/26/2007  . SEIZURE DISORDER 09/26/2007   Past Medical History:  Diagnosis Date  . Allergic rhinitis   . Asthma   . Barrett syndrome   . Depressive disorder, not elsewhere classified   . Dizziness and giddiness   . Esophageal reflux   . Localization-related (focal) (partial) epilepsy and epileptic syndromes with complex partial seizures, with intractable epilepsy     . Memory loss   . Other and unspecified hyperlipidemia   . Seizure disorder ([place])   . Unspecified hypothyroidism     Family History  Problem Relation Age of Onset  . Heart disease Mother   . Memory loss Mother   . Heart disease Father   . Asthma Sister   . Parkinson's disease Sister     Past Surgical History:  Procedure Laterality Date  . APPENDECTOMY    . right hip replacement  2005   Social History   Occupational History  . retired Arboriculturist   .  Retired   Social History Main Topics  . Smoking status: Never Smoker  . Smokeless tobacco: Never Used  . Alcohol use No  . Drug use: No  . Sexual activity: Not on file

## 2017-04-10 ENCOUNTER — Telehealth: Payer: Self-pay | Admitting: Diagnostic Neuroimaging

## 2017-04-10 NOTE — Telephone Encounter (Signed)
Error patient going to contact PCP

## 2017-04-11 ENCOUNTER — Encounter: Payer: Self-pay | Admitting: Psychology

## 2017-04-17 ENCOUNTER — Ambulatory Visit (INDEPENDENT_AMBULATORY_CARE_PROVIDER_SITE_OTHER): Payer: Medicare Other | Admitting: Psychology

## 2017-04-17 ENCOUNTER — Encounter: Payer: Self-pay | Admitting: Psychology

## 2017-04-17 DIAGNOSIS — R413 Other amnesia: Secondary | ICD-10-CM | POA: Diagnosis not present

## 2017-04-17 DIAGNOSIS — G40209 Localization-related (focal) (partial) symptomatic epilepsy and epileptic syndromes with complex partial seizures, not intractable, without status epilepticus: Secondary | ICD-10-CM

## 2017-04-17 NOTE — Progress Notes (Signed)
NEUROPSYCHOLOGICAL INTERVIEW (CPT: [id number])  Name: [name] Date of Birth: 21-Nov-1933 Date of Interview: 04/17/2017  Reason for Referral:  [name] is a 83 y.o. male who is referred for neuropsychological evaluation by Dr. Leanna Battles of [facility] due to concerns about memory loss. This patient is accompanied in the office by his wife who supplements the history.  History of Presenting Problem:  [name] has a history of complex partial seizures first diagnosed in 73 with two EEGs showing evidence of left temporal lobe epileptiform activity. He has been followed by [facility] for a number of years and currently sees Dr. Leta Baptist there. MMSE on 03/03/2017 was 25/30 (previously 27/30 in 01/2016 and 28/30 in 04/2015). The patient has complained of memory loss for several years. Records indicate he completed a neuropsychological evaluation in 06/2007 which showed "superior intelligence, but loss of short-term and delayed memory". MRI completed on 12/12/2012 reportedly showed mild generalized atrophy, no significant changes from 12/2009. [name] is currently taking donepezil and memantine prescribed by his PCP.   Today [name] reports a history of gradually progressive memory loss. He reports difficulty retrieving people's names and his schedule of what he is supposed to do. He complains of both short term and long term memory difficulties. Upon direct questioning, he and his wife endorse forgetfulness for recent conversations and events, andsome repetition of statements/questions, but no significant difficulty with word finding, comprehension, concentration, or navigation. He continues to drive and manage his medications, appointments and finances independently without any reported difficulty. He has not gotten lost. He has not had any MVAs.   He denies any major physical complaints aside from chronic right ankle problems. He walks every day and does  calisthenics 3-4 times a week. He and his wife live in an independent-living apartment at [facility]; they have been there about 3 years. He socializes regularly. They eat their meals with friends. He enjoys Interior and spatial designer.   He does not believe he has had a seizure in the past year. He takes levetiracetam 750 mg every 24 hours. He has not had any falls in the past year. He does not have significant fatigue. He denies sleep difficulty. He has not had any hallucinations or behavioral changes. His mood is described as "level". He and his wife deny any mood changes or depression. He denies any psychosocial stressors.  Psychiatric history was denied. He has never been treated for any mental health condition. He denied history of substance abuse or dependence.  Family history is significant for possible dementia in his mother when she was in her 7s. There is no known family history of seizures.   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 Allergic rhinitis   . Asthma   . Barrett syndrome   . Depressive disorder, not elsewhere classified   . Dizziness and giddiness   . Esophageal reflux   . 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 Memory loss   . Other and unspecified hyperlipidemia   . Seizure disorder ([place])   . Unspecified hypothyroidism      Current Medications:  Outpatient Encounter Prescriptions as of 04/17/2017  Medication Sig  . acetaminophen (  TYLENOL) 500 MG tablet Take 500 mg by mouth every 6 (six) hours as needed.  Marland Kitchen Apoaequorin (PREVAGEN) 10 MG CAPS Take 10 mg by mouth every morning.  . cromolyn (NASALCROM)  5.2 MG/ACT nasal spray Place 1 spray into both nostrils 2 (two) times daily.  Marland Kitchen donepezil (ARICEPT) 10 MG tablet Take 10 mg by mouth at bedtime.  . finasteride (PROSCAR) 5 MG tablet Take 1 tablet by mouth daily.  . Levetiracetam 750 MG TB24 Take 1 tablet (750 mg total) by mouth at bedtime.  Marland Kitchen levothyroxine (SYNTHROID, LEVOTHROID) 100 MCG tablet Take 100 mcg by mouth daily before breakfast.  . Loperamide-Simethicone 2-125 MG TABS Take by mouth.  . memantine (NAMENDA) 5 MG tablet Take 5 mg by mouth 2 (two) times daily.  . Multiple Vitamin (MULTIVITAMIN) tablet Take 1 tablet by mouth daily.    Marland Kitchen omeprazole (PRILOSEC) 20 MG capsule Take 20 mg by mouth daily.    . pravastatin (PRAVACHOL) 20 MG tablet Take 1 tablet by mouth daily.  . Probiotic Product (ALIGN PO) Take by mouth.  . Pyridoxine HCl (VITAMIN B-6) 500 MG tablet Take 500 mg by mouth daily.    . traMADol (ULTRAM) 50 MG tablet   . vitamin B-12 (CYANOCOBALAMIN) 1000 MCG tablet Take 1,000 mcg by mouth daily.    . VOLTAREN 1 % GEL    No facility-administered encounter medications on file as of 04/17/2017.      Behavioral Observations:   Appearance: Neatly and appropriately dressed and groomed Gait: Ambulated independently, no gross abnormalities observed Speech: Fluent; normal rate, rhythm and volume. No significant word finding difficulty during conversational speech. Thought process: Linear, goal directed Affect: Full, euthymic Interpersonal: Pleasant, appropriate   TESTING: There is medical necessity to proceed with neuropsychological assessment as the results will be used to aid in differential diagnosis and clinical decision-making and to inform specific treatment recommendations. Per the patient, his wife and medical records reviewed, there has been a change in cognitive functioning and a reasonable suspicion of dementia.  Following the clinical interview, the patient completed a full battery of neuropsychological testing with my  psychometrician under my supervision.   PLAN: The patient will return to see me for a follow-up session at which time his test performances and my impressions and treatment recommendations will be reviewed in detail.  Full report to follow.

## 2017-04-17 NOTE — Progress Notes (Signed)
   Neuropsychology Note  [name] came in today for 1 hour of neuropsychological testing with technician, [name], BS, under the supervision of Dr. Macarthur Critchley. The patient did not appear overtly distressed by the testing session, per behavioral observation or via self-report to the technician. Rest breaks were offered. [name] will return within 2 weeks for a feedback session with Dr. Si Raider at which time his test performances, clinical impressions and treatment recommendations will be reviewed in detail. The patient understands he can contact our office should he require our assistance before this time.  Full report to follow.

## 2017-05-06 DIAGNOSIS — M545 Low back pain: Secondary | ICD-10-CM | POA: Diagnosis not present

## 2017-05-06 DIAGNOSIS — I351 Nonrheumatic aortic (valve) insufficiency: Secondary | ICD-10-CM | POA: Diagnosis not present

## 2017-05-06 DIAGNOSIS — M25571 Pain in right ankle and joints of right foot: Secondary | ICD-10-CM | POA: Diagnosis not present

## 2017-05-06 DIAGNOSIS — K625 Hemorrhage of anus and rectum: Secondary | ICD-10-CM | POA: Diagnosis not present

## 2017-05-06 DIAGNOSIS — Z1389 Encounter for screening for other disorder: Secondary | ICD-10-CM | POA: Diagnosis not present

## 2017-05-06 DIAGNOSIS — Z6825 Body mass index (BMI) 25.0-25.9, adult: Secondary | ICD-10-CM | POA: Diagnosis not present

## 2017-05-06 DIAGNOSIS — G308 Other Alzheimer's disease: Secondary | ICD-10-CM | POA: Diagnosis not present

## 2017-05-09 NOTE — Progress Notes (Signed)
NEUROPSYCHOLOGICAL EVALUATION   Name:    [name]  Date of Birth:   1934-04-29 Date of Interview:  04/17/2017 Date of Testing:  04/17/2017   Date of Feedback:  05/12/2017       Background Information:  Reason for Referral:  [name] is a 83 y.o. male referred by Dr. Leanna Battles of [place] to assess his current level of cognitive functioning and assist in differential diagnosis. The current evaluation consisted of a review of available medical records, an interview with the patient and his wife, and the completion of a neuropsychological testing battery. Informed consent was obtained.  History of Presenting Problem:  [name] has a history of complex partial seizures first diagnosed in 18 with two EEGs showing evidence of left temporal lobe epileptiform activity. He has been followed by [facility] for a number of years and currently sees Dr. Leta Baptist there. MMSE on 03/03/2017 was 25/30 (previously 27/30 in 01/2016 and 28/30 in 04/2015). The patient has complained of memory loss for several years. Records indicate he completed a neuropsychological evaluation in 06/2007 which showed "superior intelligence, but loss of short-term and delayed memory". MRI completed on 12/12/2012 reportedly showed mild generalized atrophy, no significant changes from 12/2009. [name] is currently taking donepezil and memantine prescribed by his PCP.   Today [name] reports a history of gradually progressive memory loss. He reports difficulty retrieving people's names and his schedule of what he is supposed to do. He complains of both short term and long term memory difficulties. Upon direct questioning, he and his wife endorse forgetfulness for recent conversations and events, and some repetition of statements/questions, but no significant difficulty with word finding, comprehension, concentration, or navigation. He continues to drive and manage his medications,  appointments and finances independently without any reported difficulty. He has not gotten lost. He has not had any MVAs.   He denies any major physical complaints aside from chronic right ankle problems. He walks every day and does calisthenics 3-4 times a week. He and his wife live in an independent-living apartment at [facility]; they have been there about 3 years. He socializes regularly. They eat their meals with friends. He enjoys Interior and spatial designer.   He does not believe he has had a seizure in the past year. He takes levetiracetam 750 mg every 24 hours. He has not had any falls in the past year. He does not have significant fatigue. He denies sleep difficulty. He has not had any hallucinations or behavioral changes. His mood is described as "level". He and his wife deny any mood changes or depression. He denies any psychosocial stressors.  Psychiatric history was denied. He has never been treated for any mental health condition. He denied history of substance abuse or dependence.  Family history is significant for possible dementia in his mother when she was in her 38s. There is no known family history of seizures.   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 Allergic rhinitis   . Asthma   . Barrett syndrome   . Depressive disorder, not elsewhere classified   . Dizziness and giddiness   . Esophageal reflux   . Localization-related (focal) (partial) epilepsy and epileptic syndromes with complex  partial seizures, with intractable epilepsy   . Memory loss   . Other and unspecified hyperlipidemia   . Seizure disorder ([place])   . Unspecified hypothyroidism     Current medications:  Outpatient Encounter  Prescriptions as of 05/12/2017  Medication Sig  . acetaminophen (TYLENOL) 500 MG tablet Take 500 mg by mouth every 6 (six) hours as needed.  Marland Kitchen Apoaequorin (PREVAGEN) 10 MG CAPS Take 10 mg by mouth every morning.  . cromolyn (NASALCROM) 5.2 MG/ACT nasal spray Place 1 spray into both nostrils 2 (two) times daily.  Marland Kitchen donepezil (ARICEPT) 10 MG tablet Take 10 mg by mouth at bedtime.  . finasteride (PROSCAR) 5 MG tablet Take 1 tablet by mouth daily.  . Levetiracetam 750 MG TB24 Take 1 tablet (750 mg total) by mouth at bedtime.  Marland Kitchen levothyroxine (SYNTHROID, LEVOTHROID) 100 MCG tablet Take 100 mcg by mouth daily before breakfast.  . Loperamide-Simethicone 2-125 MG TABS Take by mouth.  . memantine (NAMENDA) 5 MG tablet Take 5 mg by mouth 2 (two) times daily.  . Multiple Vitamin (MULTIVITAMIN) tablet Take 1 tablet by mouth daily.    Marland Kitchen omeprazole (PRILOSEC) 20 MG capsule Take 20 mg by mouth daily.    . pravastatin (PRAVACHOL) 20 MG tablet Take 1 tablet by mouth daily.  . Probiotic Product (ALIGN PO) Take by mouth.  . Pyridoxine HCl (VITAMIN B-6) 500 MG tablet Take 500 mg by mouth daily.    . traMADol (ULTRAM) 50 MG tablet   . vitamin B-12 (CYANOCOBALAMIN) 1000 MCG tablet Take 1,000 mcg by mouth daily.    . VOLTAREN 1 % GEL    No facility-administered encounter medications on file as of 05/12/2017.      Current Examination:  Behavioral Observations:   Appearance: Neatly and appropriately dressed and groomed Gait: Ambulated independently, no gross abnormalities observed Speech: Fluent; normal rate, rhythm and volume. No significant word finding difficulty during conversational speech. Thought process: Linear, goal directed Affect: Full, euthymic Interpersonal: Pleasant, appropriate Orientation: Oriented to person, place and current month. Disoriented to current date, day of the week and year. Accurately named the current President but did not accurately recall his predecessor (reported [employer]).  Tests Administered: . Test of Premorbid Functioning (TOPF) . Wechsler Adult Intelligence Scale-Fourth Edition (WAIS-IV): Similarities, Music therapist, Coding and Digit Span subtests . Wechsler Memory Scale-Fourth Edition (WMS-IV) Older Adult Version (ages 68-90): Logical Memory I, II and Recognition subtests  . [employer] Verbal Learning Test - 2nd Edition (CVLT-2) Short Form . Repeatable Battery for the Assessment of Neuropsychological Status (RBANS) Form A:  Figure Copy and Recall subtests and Semantic Fluency subtest . Neuropsychological Assessment Battery (NAB) Language Module, Form 1: Naming subtest . [place] Diagnostic Aphasia Examination: Complex Ideational Material subtest . Controlled Oral Word Association Test (COWAT) . Trail Making Test A and B . Clock drawing test . Geriatric Depression Scale (GDS) 15 Item . Generalized Anxiety Disorder - 7 item screener (GAD-7)  Test Results: Note: Standardized scores are presented only for use by appropriately trained professionals and to allow for any future test-retest comparison. These scores should not be interpreted without consideration of all the information that is contained in the rest of the report. The most recent standardization samples from the test publisher or other sources were used whenever possible to derive standard scores; scores were corrected for age, gender, ethnicity and education when available.   Test Scores:  Test Name Raw Score Standardized Score Descriptor  TOPF 57/70 SS= 115 High average  WAIS-IV Subtests     Similarities 25/36 ss= 12 High average  Block Design 46/66 ss= 16 Very superior  Coding 43/135 ss= 11 Average  Digit Span Forward 16/16 ss= 19 Very superior  Digit Span Backward 8/16 ss= 11 Average  WMS-IV Subtests     LM I 27/53 ss= 10 Average  LM II 0/39 ss= 2 Impaired  LM II Recognition 13/23 Cum %: 3-9 Below expectation  RBANS Subtests     Figure Copy 20/20 Z= 1.4 Superior  Figure Recall  6/20 Z= -1.3 Low average  Semantic Fluency 6 Z= -3.1 Severely impaired  CVLT-II Scores     Trial 1 4/9 Z= -0.5 Average  Trial 4 5/9 Z= -1.5 Borderline  Trials 1-4 total 21/36 T= 52 Average  SD Free Recall 3/9 Z= -1.5 Borderline  LD Free Recall 0/9 Z= -1.5 Borderline  LD Cued Recall 2/9 Z= -1.5 Borderline  Recognition Hits 5/9  Z= -2.5 Severely impaired  Recognition False Positives 0 Z= 1.5 High average  Forced Choice Recognition 8/9  Below expectation  NAB Naming 16/31 T= 19 Severely impaired  BDAE Complex Ideational Material 12/12  WNL  COWAT-FAS 37 T= 50 Average  COWAT-Animals 8 T= 31 Borderline  Trail Making Test A  45" 1 error T= 56 Average  Trail Making Test B  113" 1 error T= 55 Average  Clock Drawing   Impaired  GDS-15 7/15  Mild  GAD-7 0/21  WNL      Description of Test Results:  Premorbid verbal intellectual abilities were estimated to have been within the high average range based on a test of word reading. Psychomotor processing speed was average. Basic auditory attention was very superior, and more complex auditory attention or working memory was average. Visual-spatial construction was very superior. Language abilities were below expectation. Specifically, confrontation naming was severely impaired, and semantic verbal fluency ranged from borderline impaired to severely impaired. Auditory comprehension of complex ideational material was intact, however. With regard to verbal memory, encoding and acquisition of non-contextual information (i.e., word list) was average overall but he demonstrated a relatively flat learning curve. After a brief distracter task, free recall was borderline (3/9 items recalled). After a delay, free recall was borderline impaired (0/9 items recalled). Cued recall was borderline impaired (2/9 items recalled with semantic cueing). Performance on a yes/no recognition task was severely impaired due to low number of target items identified (but no false  positive errors were made). On another verbal memory test, encoding and acquisition of contextual auditory information (i.e., short stories) was average. After a delay, free recall was impaired (with no aspects of either original story recalled). Performance on a yes/no recognition task was below expectation as well. With regard to non-verbal memory, delayed free recall of visual information was low average. Executive functioning was largely intact although he did have difficulty on one task. Mental flexibility and set-shifting were average on Trails B. Verbal fluency with phonemic search restrictions was average. Verbal abstract reasoning was high average. Performance on a clock drawing task was impaired due to incorrect time placement. On a self-report measure of mood, the patient's responses were indicative of mild depression at the present time. He endorsed dropping many interests/activities, feeling that life is empty, boredom, feelings of helplessness, and preferring to say home. On a self-report measure of anxiety, the patient did not endorse clinically significant generalized anxiety at the present time.    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  Results of the current evaluation demonstrate impairment in consolidation of new memories (particularly verbal information), reduced orientation to time, and semantic retrieval deficits. Meanwhile, visual-spatial skills (which were always likely a relative strength), auditory attention, and abstract reasoning are all well above average (ie, in the high average to very superior range for his age). Furthermore, mental flexibility, immediate recall of new information, phonemic verbal fluency, auditory comprehension, and processing speed are al in the average range for his age. The patient and his wife deny any interference in his ability to manage  complex ADLs including driving, medications, appointments and finances. As such, diagnostic criteria for a dementia syndrome are not met at this time. 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 His cognitive profile matches what is typically seen in prodromal Alzheimer's disease. I suspect he has a high level of cognitive reserve which as provided some protection for him; additionally, donepezil and mementine may be providing some additional benefit and reducing the rate of conversion of MCI to dementia. There is no evidence of a primary psychiatric disorder.     Recommendations/Plan: Based on the findings of the present evaluation, the following recommendations are offered:  1. Cholinesterase inhibitor therapy can be continued as he likely has prodromal/underlying AD. 2. External aids for memory deficits should continue to be used, including calendar, alarm reminders for time sensitive events such as medications, etc.  3. Education regarding MCI and the most likely etiology was provided to the patient and his wife. I also provided written information for them. 4. To decrease the risk of progression to dementia, and to increase overall brain health, he was encouraged to continue to engage in activities that provide social interaction, mental stimulation and cardiovascular exercise. 5. Mr. Loh performed well on a cognitive test highly correlated with driving ability, and he has not had any issues with driving. As with all drivers with MCI, I do recommend that he limit his driving to familiar locations and avoid driving to new locations by himself. 6. Neuropsychological re-evaluation in 1-2 years is recommended in order to monitor cognitive status, track any progression of symptoms and further assist with treatment planning.   Feedback to Patient: CHRSTOPHER MALENFANT and his wife returned for a feedback appointment on 05/12/2017 to review the results of his neuropsychological evaluation with this  provider. 30 minutes face-to-face time was spent reviewing his test results, my impressions and my recommendations as detailed above.   A copy of this report will be sent to the patient's referring physician (PCP Dr. Philip Aspen) as well as his neurologist (Dr. Leta Baptist), per the patient's request.   Total time spent on this patient's case: [phone] unit for interview with psychologist; [phone] units of testing by psychometrician under psychologist's supervision; [phone] units for medical record review, scoring of neuropsychological tests, interpretation of test results, preparation of this report, and review of results to the patient by psychologist.      Thank you for your referral of LADARRIUS BOGDANSKI. Please feel free to contact me if you have any questions or concerns regarding this report.

## 2017-05-12 ENCOUNTER — Encounter: Payer: Self-pay | Admitting: Psychology

## 2017-05-12 ENCOUNTER — Ambulatory Visit (INDEPENDENT_AMBULATORY_CARE_PROVIDER_SITE_OTHER): Payer: Medicare Other | Admitting: Psychology

## 2017-05-12 DIAGNOSIS — R413 Other amnesia: Secondary | ICD-10-CM | POA: Diagnosis not present

## 2017-05-12 DIAGNOSIS — G3184 Mild cognitive impairment, so stated: Secondary | ICD-10-CM

## 2017-05-12 NOTE — Patient Instructions (Signed)
Clinical Impressions: Amnestic mild cognitive impairment. Results of the current evaluation demonstrate impairment in consolidation of new memories (particularly verbal information), reduced orientation to time, and semantic retrieval deficits. Meanwhile, visual-spatial skills (which were always likely a relative strength), auditory attention, and abstract reasoning are all well above average (ie, in the high average to very superior range for his age). Furthermore, mental flexibility, immediate recall of new information, phonemic verbal fluency, auditory comprehension, and processing speed are al in the average range for his age. The patient and his wife deny any interference in his ability to manage complex ADLs including driving, medications, appointments and finances. As such, diagnostic criteria for a dementia syndrome are not met at this time. 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 His cognitive profile matches what is typically seen in prodromal Alzheimer's disease. I suspect he has a high level of cognitive reserve which as provided some protection for him; additionally, donepezil and mementine may be providing some additional benefit and reducing the rate of conversion of MCI to dementia. There is no evidence of a primary psychiatric disorder.

## 2017-05-13 DIAGNOSIS — Z1212 Encounter for screening for malignant neoplasm of rectum: Secondary | ICD-10-CM | POA: Diagnosis not present

## 2017-06-25 DIAGNOSIS — H903 Sensorineural hearing loss, bilateral: Secondary | ICD-10-CM | POA: Diagnosis not present

## 2017-06-25 DIAGNOSIS — H6123 Impacted cerumen, bilateral: Secondary | ICD-10-CM | POA: Diagnosis not present

## 2017-07-08 ENCOUNTER — Encounter: Payer: Self-pay | Admitting: Diagnostic Neuroimaging

## 2017-07-08 ENCOUNTER — Ambulatory Visit (INDEPENDENT_AMBULATORY_CARE_PROVIDER_SITE_OTHER): Payer: Medicare Other | Admitting: Diagnostic Neuroimaging

## 2017-07-08 VITALS — BP 101/58 | HR 71 | Ht 66.0 in | Wt 150.6 lb

## 2017-07-08 DIAGNOSIS — G40209 Localization-related (focal) (partial) symptomatic epilepsy and epileptic syndromes with complex partial seizures, not intractable, without status epilepticus: Secondary | ICD-10-CM | POA: Diagnosis not present

## 2017-07-08 DIAGNOSIS — G3184 Mild cognitive impairment, so stated: Secondary | ICD-10-CM

## 2017-07-08 MED ORDER — LEVETIRACETAM ER 750 MG PO TB24: 750.0000 mg | ORAL_TABLET | Freq: Every day | ORAL | 4 refills | Status: DC

## 2017-07-08 NOTE — Progress Notes (Signed)
[facility]  PATIENT: [name] DOB: [birth year]  REFERRING CLINICIAN:  HISTORY FROM: patient REASON FOR VISIT: follow up   HISTORICAL  CHIEF COMPLAINT:  Chief Complaint  Patient presents with  . Follow-up  . Seizures    none.  . Memory Loss    has seen Dr, Halina Andreas.    HISTORY OF PRESENT ILLNESS:   UPDATE 07/08/17: Since last visit doing well. Memory loss stable. Neuropsych testing reviewed. No seizures.   UPDATE 03/03/17: Since last visit, doing about the same, with some mild worsening of memory loss issues.  UPDATE 02/01/16: Since last visit, doing about the same. No seizures . Memory issues stable.   UPDATE 04/27/15: Since last visit, still c/o memory issues. Mainly names and appts. Tries to write things down and and stay organized. Now living at [place].   UPDATE 02/02/15: Since last visit, memory prob continue. No sz. More sleepy in daytime.   UPDATE 10/24/14: Since last visit, doing well. No sz. No spells. Memory loss is stable.   UPDATE 04/21/14: Since last visit, was doing well until 12/29/13, at coffee shop with friends, then had a 1 min episode of staring/memory loss. No convulsions or syncope. He thought this may have been a "mini sz" but he didn't contact our office. He contacted PCP, and was recommended to add LEV 226m in AM, in addition to his 5055mXR tabs in the evening. Memory loss is stable. Still driving. Recently transitioned to retirement community.   UPDATE 05/27/13 (Dr. AtRexene Alberts 83 year old right-handed gentleman who presents for followup consultation of his complex partial seizure disorder. He is accompanied by his wife today. This is his first visit after Dr. LoTressia Danasetirement and he was last seen by Dr. JaJeneen Rinksove on 12/02/2012, at which time Dr. LoErling Cruzid not change his antiseizure medication and felt hesitant to start donepezil for memory loss as it can lower seizure threshold. He suggested brain MRI and lab work including [place]. His MMSE at that time was 27/30, clock drawing was 4, animal fluency was 9. The patient has an underlying medical history of asthma, esophagitis, thyroid disease, arthritis status post right hip replacement surgery in 2005 and seizure disorder. He is currently on pravastatin, B12, axona, omeprazole, Synthroid, Advair, Keppra, vitamin B6, multivitamin.  PRIOR HPI (12/02/12, Dr. LoErling Cruz 83 year old right-handed white married male with complex partial seizures first diagnosed in 1983 with normal CAT scan and MRI study of the brain but two EEGs showing evidence of left temporal lobe epileptiform activity. He developed side effects on Dilantin which was discontinued in 1986. He  had 2 simple partial seizures and Dilantin was reinstituted. Dilantin was changed to Tegretol but he developed lethargy on Tegretol and was tapered off.He had a seizure 05/10/1997 and Tegretol was restarted.He was tried of [facility], [facility] having episodes of strange feeling lasting 20-30 seconds with dj vu and memory loss. During these episodes he would be unresponsive according his wife who would shake him. MRI 06/05/2005 was normal except for mild atrophy and EEG 10/11/2005 was normal. He was placed on Lamictal but did not tolerate this well and continued to have seizures on 300 mg per day. He had a [place] He was slowly changed to [place] has not had recurrent seizures but has had poor tolerance with sleepiness. He was changed from 500 mg twice per day to 500 mg ER twice daily and subsequently  250 mg levetiracetam 3 times per day. He is now on levetiracetam 500 mg ER once daily . I saw him 06/19/09 for episodes of vague sensation with spinning lasting 20-30 seconds. These would occur sitting, lying, standing, and when turning over in bed. There was no loss of consciousness or aura of macropsia, micropsia, dj vu, strange odors or   tastes. His examination was normal with negative Dix-Hallpike hanging head test. He started  vertigo exercises and his symptoms resolved.  If he does not do his exercises the symptoms recur. He complains of memory loss He has been evaluated 06/2007 for memory loss with a neuropsychological battery showing superior intelligence, but loss of short-term and delayed memory. His mother had memory loss beginning in her 45s. He is able to pay the bills and right the checks.He can't  remember what day it is. He has difficulty with peoples names that he has known for years. He takes naps for 15 minutes after breakfast, and one to 2 naps after lunch. He drinks coffee during the day, small amounts  because of his Barrett's esophagitis which he states helps his memory. He does not snore at night. He awakens refreshed in the mornings. He is independent in activities of daily. 12/03/2011=(MMSE 26/30. CDT 4/4. AFT 12. [name] Index of independence in activities of daily living 6. Lawton-Brody instrumental activities of daily living scale 7. Neuropsychiatric inventory=0. Geriatric depression scale 2/15. Falls assessment tool score 5.CBC, CMP,  lipid profile, and TSH normal 11/13/11. He has not had recurrent seizures. He denies macropsia, micropsia, strange odors or tastes.He complains of daytime sleepiness and takeslevetiracetam 500 mg ER once at night. He complains of worsening long term memory, particularly noticed after gathering of classmates from his college days. He is performing [email or web address] and an exercise program with  improvement  in his memory He exercises by doing calisthenics 5 times per week, but  injured his right ankle many years ago when he fell off of a tank and has recurrent right ankle pain, uses a brace. 12/02/2012=( MMSE 27/30. CDT 4/4. AFT 9. [name] 6. Lawton-Brody 7. Neuropsychiatric inventory=0.  Geriatric depression scale 1/15 Falls assessment tool score 5).   REVIEW OF SYSTEMS: Full 14 system review of systems  performed and negative except: memory loss joint swelling.    ALLERGIES: Allergies  Allergen Reactions  . Dilantin [Phenytoin Sodium Extended]   . Lamotrigine Other (See Comments)    Drowsiness  . Sulfonamide Derivatives     As a child    HOME MEDICATIONS: Outpatient Medications Prior to Visit  Medication Sig Dispense Refill  . acetaminophen (TYLENOL) 500 MG tablet Take 500 mg by mouth every 6 (six) hours as needed.    Marland Kitchen Apoaequorin (PREVAGEN) 10 MG CAPS Take 10 mg by mouth every morning.    . cromolyn (NASALCROM) 5.2 MG/ACT nasal spray Place 1 spray into both nostrils 2 (two) times daily.    Marland Kitchen donepezil (ARICEPT) 10 MG tablet Take 10 mg by mouth at bedtime.    . finasteride (PROSCAR) 5 MG tablet Take 1 tablet by mouth daily.  0  . Levetiracetam 750 MG TB24 Take 1 tablet (750 mg total) by mouth at bedtime. 90 each 4  . levothyroxine (SYNTHROID, LEVOTHROID) 100 MCG tablet Take 100 mcg by mouth daily before breakfast.    . Loperamide-Simethicone 2-125 MG TABS Take by mouth.    . memantine (NAMENDA) 5 MG tablet Take 5 mg by mouth 2 (two) times daily.    . Multiple Vitamin (MULTIVITAMIN)  tablet Take 1 tablet by mouth daily.      Marland Kitchen omeprazole (PRILOSEC) 20 MG capsule Take 20 mg by mouth daily.      . pravastatin (PRAVACHOL) 20 MG tablet Take 1 tablet by mouth daily.    . Probiotic Product (ALIGN PO) Take by mouth.    . Pyridoxine HCl (VITAMIN B-6) 500 MG tablet Take 500 mg by mouth daily.      . traMADol (ULTRAM) 50 MG tablet   0  . vitamin B-12 (CYANOCOBALAMIN) 1000 MCG tablet Take 1,000 mcg by mouth daily.      . VOLTAREN 1 % GEL   0   No facility-administered medications prior to visit.     PAST MEDICAL HISTORY: Past Medical History:  Diagnosis Date  . Allergic rhinitis   . Asthma   . Barrett syndrome   . Depressive disorder, not elsewhere classified   . Dizziness and giddiness   . Esophageal reflux   . Localization-related (focal) (partial) epilepsy and epileptic  syndromes with complex partial seizures, with intractable epilepsy   . Memory loss   . Other and unspecified hyperlipidemia   . Seizure disorder ([place])   . Unspecified hypothyroidism     PAST SURGICAL HISTORY: Past Surgical History:  Procedure Laterality Date  . APPENDECTOMY    . right hip replacement  2005    FAMILY HISTORY: Family History  Problem Relation Age of Onset  . Heart disease Mother   . Memory loss Mother   . Heart disease Father   . Asthma Sister   . Parkinson's disease Sister     SOCIAL HISTORY:  Social History   Social History  . Marital status: Married    Spouse name: Eugenia  . Number of children: 2  . Years of education: BA   Occupational History  . retired Arboriculturist   .  Retired   Social History Main Topics  . Smoking status: Never Smoker  . Smokeless tobacco: Never Used  . Alcohol use No  . Drug use: No  . Sexual activity: Not on file   Other Topics Concern  . Not on file   Social History Narrative   Patient lives at home with family. ([place])   Caffeine Use: up to 3 cups daily     PHYSICAL EXAM  Vitals:   07/08/17 1331  BP: (!) 101/58  Pulse: 71  Weight: 150 lb 9.6 oz (68.3 kg)  Height: '5\' 6"'  (1.676 m)    Not recorded     Body mass index is 24.31 kg/m.  MMSE - Mini Mental State Exam 07/08/2017 03/03/2017 02/01/2016  Orientation to time '4 5 4  ' Orientation to Place '3 4 5  ' Registration '3 3 3  ' Attention/ Calculation '4 4 5  ' Recall 0 0 1  Language- name 2 objects '2 2 2  ' Language- repeat '1 1 1  ' Language- follow 3 step command '3 3 3  ' Language- read & follow direction '1 1 1  ' Write a sentence '1 1 1  ' Copy design '1 1 1  ' Total score '23 25 27     ' GENERAL EXAM: Patient is in no distress; well developed, nourished and groomed; neck is supple  CARDIOVASCULAR: Regular rate and rhythm, no murmurs, no carotid bruits  NEUROLOGIC: MENTAL STATUS: awake, alert, language fluent, comprehension intact, naming intact, fund of  knowledge appropriate.  CRANIAL NERVE: pupils equal and reactive to light, visual fields full to confrontation, extraocular muscles intact, no nystagmus, facial sensation and strength symmetric, hearing  intact, palate elevates symmetrically, uvula midline, shoulder shrug symmetric, tongue midline. MOTOR: normal bulk and tone, full strength in the BUE, BLE SENSORY: normal and symmetric to light touch COORDINATION: finger-nose-finger, fine finger movements normal REFLEXES: deep tendon reflexes present and symmetric GAIT/STATION: SLOW, LIMPING GAIT. ABLE TO TANDEM; STOOPED POSTURE.     DIAGNOSTIC DATA (LABS, IMAGING, TESTING) - I reviewed patient records, labs, notes, testing and imaging myself where available.  No results found for: WBC No results found for: NA No results found for: CHOL No results found for: HGBA1C No results found for: VITAMINB12 No results found for: TSH  12/12/12 MRI brain - Mild generalized cerebral atrophy. No significant changes compared with prior MRI scan dated 01/09/2010.  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  Results of the current evaluation demonstrate impairment in consolidation of new memories (particularly verbal information), reduced orientation to time, and semantic retrieval deficits. Meanwhile, visual-spatial skills (which were always likely a relative strength), auditory attention, and abstract reasoning are all well above average (ie, in the high average to very superior range for his age). Furthermore, mental flexibility, immediate recall of new information, phonemic verbal fluency, auditory comprehension, and processing speed are al in the average range for his age. The patient and his wife deny any interference in his ability to manage complex ADLs including driving, medications,  appointments and finances. As such, diagnostic criteria for a dementia syndrome are not met at this time. 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 His cognitive profile matches what is typically seen in prodromal Alzheimer's disease. I suspect he has a high level of cognitive reserve which as provided some protection for him; additionally, donepezil and mementine may be providing some additional benefit and reducing the rate of conversion of MCI to dementia. There is no evidence of a primary psychiatric disorder.    ASSESSMENT AND PLAN  83 y.o. year old male here with complex partial seizures and memory loss. May have had a seizure vs memory lapse spell in March 2015. No more events since on LEV 791m daily. Memory loss --> probable mild cognitive impariement; prodromal neurodegenerative dementia is also possible.    Dx:  Amnestic MCI (mild cognitive impairment with memory loss)  Partial symptomatic epilepsy with complex partial seizures, not intractable, without status epilepticus (HCC)    PLAN:  SEIZURE DISRODER - Continue LEV XR [id number]  MCI / prodromal AD - Continue donepezil + memantine (started per Dr. PaPhilip Aspen- Caution with driving - Continue exercise, mental and socially stimulating activities - Gave information of clinic research studies; he is interested  Meds ordered this encounter  Medications  . Levetiracetam 750 MG TB24    Sig: Take 1 tablet (750 mg total) by mouth at bedtime.    Dispense:  90 each    Refill:  4   Return in about 1 year (around 07/08/2018).    VIPenni BombardMD 06/26/67/12751:1:70M Certified in Neurology, Neurophysiology and Neuroimaging  [facility] [address] [phone]

## 2017-07-17 DIAGNOSIS — Z6824 Body mass index (BMI) 24.0-24.9, adult: Secondary | ICD-10-CM | POA: Diagnosis not present

## 2017-07-17 DIAGNOSIS — R05 Cough: Secondary | ICD-10-CM | POA: Diagnosis not present

## 2017-07-22 DIAGNOSIS — Z23 Encounter for immunization: Secondary | ICD-10-CM | POA: Diagnosis not present

## 2017-08-06 ENCOUNTER — Other Ambulatory Visit (INDEPENDENT_AMBULATORY_CARE_PROVIDER_SITE_OTHER): Payer: Self-pay | Admitting: Family

## 2017-08-06 ENCOUNTER — Telehealth (INDEPENDENT_AMBULATORY_CARE_PROVIDER_SITE_OTHER): Payer: Self-pay | Admitting: Orthopedic Surgery

## 2017-08-06 MED ORDER — TRAMADOL HCL 50 MG PO TABS: 50.0000 mg | ORAL_TABLET | Freq: Two times a day (BID) | ORAL | 0 refills | Status: DC | PRN

## 2017-08-06 NOTE — Telephone Encounter (Signed)
Patient called asked if Dr Sharol Given will prescribe him something for pain until his appointment 08/12/17 at 1:30pm with Dr Sharol Given. The number to contact patient is [phone]

## 2017-08-06 NOTE — Telephone Encounter (Signed)
I called and spoke with patient to confirm pharmacy is [facility]. Rx for tramadol called into patients pharmacy.

## 2017-08-12 ENCOUNTER — Ambulatory Visit (INDEPENDENT_AMBULATORY_CARE_PROVIDER_SITE_OTHER): Payer: Medicare Other | Admitting: Orthopedic Surgery

## 2017-08-12 ENCOUNTER — Encounter (INDEPENDENT_AMBULATORY_CARE_PROVIDER_SITE_OTHER): Payer: Self-pay | Admitting: Orthopedic Surgery

## 2017-08-12 DIAGNOSIS — M545 Low back pain, unspecified: Secondary | ICD-10-CM

## 2017-08-12 MED ORDER — PREDNISONE 10 MG PO TABS: 20.0000 mg | ORAL_TABLET | Freq: Every day | ORAL | 0 refills | Status: DC

## 2017-08-12 NOTE — Progress Notes (Signed)
Office Visit Note   Patient: [name]           Date of Birth: April 09, 1934           MRN: [id number] Visit Date: 08/12/2017              Requested by: Leanna Battles, [address] [place], [place] [address] PCP: Leanna Battles, MD  Chief Complaint  Patient presents with  . Lower Back - Pain      HPI: Patient is an 83 year old gentleman who for evaluation for acute lower back pain.  He states that heat helps the tramadol has not helped he states it is worse with standing no problem with sitting or driving.  Patient states he has had a steroid injection in his lumbar spine that has helped as well.  Assessment & Plan: Visit Diagnoses:  1. Acute left-sided low back pain without sciatica     Plan: A prescription for prednisone was called and he will take 20 mg in the morning with breakfast until the symptoms improve then take 10 mg every morning until he is symptom-free.  Recommended heat recommended nonimpact exercise.  Follow-Up Instructions: Return if symptoms worsen or fail to improve.   Ortho Exam  Patient is alert, oriented, no adenopathy, well-dressed, normal affect, normal respiratory effort. Examination patient does have an antalgic gait with arthritis of both knees.  He has pain in his lower lumbar spine he has a negative straight leg raise bilaterally.  No pain with range of motion of the hip knee or ankle.  There is no focal motor weakness in either lower extremity.  Imaging: No results found. No images are attached to the encounter.  Labs: No results found for: HGBA1C, ESRSEDRATE, CRP, LABURIC, REPTSTATUS, GRAMSTAIN, CULT, LABORGA  Orders:  No orders of the defined types were placed in this encounter.  Meds ordered this encounter  Medications  . predniSONE (DELTASONE) 10 MG tablet    Sig: Take 2 tablets (20 mg total) by mouth daily with breakfast.    Dispense:  60 tablet    Refill:  0     Procedures: No procedures performed  Clinical  Data: No additional findings.  ROS:  All other systems negative, except as noted in the HPI. Review of Systems  Objective: Vital Signs: There were no vitals taken for this visit.  Specialty Comments:  No specialty comments available.  PMFS History: Patient Active Problem List   Diagnosis Date Noted  . Pain in right ankle and joints of right foot 03/03/2017  . Acute left-sided low back pain without sciatica 03/03/2017  . Post-traumatic osteoarthritis, right ankle and foot 03/03/2017  . ACUTE BRONCHITIS 09/05/2009  . ALLERGIC RHINITIS 09/26/2007  . Asthma with bronchitis 09/26/2007  . ESOPHAGEAL REFLUX 09/26/2007  . SEIZURE DISORDER 09/26/2007   Past Medical History:  Diagnosis Date  . Allergic rhinitis   . Asthma   . Barrett syndrome   . Depressive disorder, not elsewhere classified   . Dizziness and giddiness   . Esophageal reflux   . 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 Memory loss   . Other and unspecified hyperlipidemia   . Seizure disorder ([place])   . Unspecified hypothyroidism     Family History  Problem Relation Age of Onset  . Heart disease Mother   . Memory loss Mother   . Heart disease Father   . Asthma Sister   . Parkinson's disease  Sister     Past Surgical History:  Procedure Laterality Date  . APPENDECTOMY    . right hip replacement  2005   Social History   Occupational History  . retired Arboriculturist   .  Retired   Social History Main Topics  . Smoking status: Never Smoker  . Smokeless tobacco: Never Used  . Alcohol use No  . Drug use: No  . Sexual activity: Not on file

## 2017-08-14 ENCOUNTER — Telehealth (INDEPENDENT_AMBULATORY_CARE_PROVIDER_SITE_OTHER): Payer: Self-pay | Admitting: Orthopedic Surgery

## 2017-08-14 NOTE — Telephone Encounter (Signed)
I called and spoke with patient to advise of message below. He expressed understanding. Went over prednisone again.

## 2017-08-14 NOTE — Telephone Encounter (Signed)
Patient called advised his back has been hurting a lot this week. Patient said he has been sleeping a lot which may be the medicine. Patient said he did not do the exercises today and felt better. Patient said he is using a heating pad and want to know if that is ok. Patient want to know things that he should and shouldn't do. The number to contact patient is [phone]

## 2017-08-14 NOTE — Telephone Encounter (Signed)
Duplicate message. 

## 2017-08-14 NOTE — Telephone Encounter (Signed)
Patient called saying that he is still experiencing a lot of back pain and was wanting some advice on what he could do to help. CB # [phone]

## 2017-08-14 NOTE — Telephone Encounter (Signed)
Call patient.  Yes he is excellent for his back symptoms.  The more he can get up and move around usually the better this is for his back.  If his symptoms worsen follow-up for evaluation.

## 2017-08-18 ENCOUNTER — Telehealth: Payer: Self-pay | Admitting: *Deleted

## 2017-08-18 ENCOUNTER — Telehealth (INDEPENDENT_AMBULATORY_CARE_PROVIDER_SITE_OTHER): Payer: Self-pay | Admitting: Orthopedic Surgery

## 2017-08-18 DIAGNOSIS — M5136 Other intervertebral disc degeneration, lumbar region: Secondary | ICD-10-CM

## 2017-08-18 NOTE — Addendum Note (Signed)
Addended by: Maxcine Ham on: 08/18/2017 02:46 PM   Modules accepted: Orders

## 2017-08-18 NOTE — Telephone Encounter (Signed)
Advised patient over the phone, continue with prednisone 20mg  daily, continue with low impact exercise like walking, discontinue any extension exercises, heavy lifting, or high impact activity. Continue with heat. We will order MRI to evaluate further pathology.

## 2017-08-18 NOTE — Telephone Encounter (Signed)
Received fax from [facility] requesting PA for Levetiracetam ER. Called [employer] number on fax, spoke with Bethena Roys and gave her requested information. She advised to do PA on CMM, assisted this RN with getting correct form for PA. PA for levetiracetam ER started on CMM.

## 2017-08-18 NOTE — Telephone Encounter (Signed)
[name],Norwin S 04-12-1934 [phone]   Back pain/ Middle or lower back pt stated he is in pain and would like to know what options he has.

## 2017-08-19 NOTE — Telephone Encounter (Signed)
Per cover my meds, levetiracetam approved, formulary exception.

## 2017-08-21 NOTE — Telephone Encounter (Signed)
Received approval letter for patient's Levetiracetam ER. Approved through 08/18/2018.

## 2017-08-25 ENCOUNTER — Ambulatory Visit
Admission: RE | Admit: 2017-08-25 | Discharge: 2017-08-25 | Disposition: A | Discharge status: Home / Self Care | Payer: Medicare Other | Source: Ambulatory Visit | Attending: Orthopedic Surgery | Admitting: Orthopedic Surgery

## 2017-08-25 DIAGNOSIS — M4807 Spinal stenosis, lumbosacral region: Secondary | ICD-10-CM | POA: Diagnosis not present

## 2017-08-25 DIAGNOSIS — M5136 Other intervertebral disc degeneration, lumbar region: Secondary | ICD-10-CM

## 2017-08-28 NOTE — Progress Notes (Signed)
I called number listed and recording states the number has been disconnected. Will try again.

## 2017-08-29 ENCOUNTER — Other Ambulatory Visit (INDEPENDENT_AMBULATORY_CARE_PROVIDER_SITE_OTHER): Payer: Self-pay

## 2017-08-29 DIAGNOSIS — M48061 Spinal stenosis, lumbar region without neurogenic claudication: Secondary | ICD-10-CM

## 2017-08-29 NOTE — Progress Notes (Signed)
I called pt and he would like referral to [facility]. Order in chart and advised that someone will call to set this up in the next several days.

## 2017-09-01 DIAGNOSIS — E038 Other specified hypothyroidism: Secondary | ICD-10-CM | POA: Diagnosis not present

## 2017-09-01 DIAGNOSIS — G309 Alzheimer's disease, unspecified: Secondary | ICD-10-CM | POA: Diagnosis not present

## 2017-09-01 DIAGNOSIS — R2681 Unsteadiness on feet: Secondary | ICD-10-CM | POA: Diagnosis not present

## 2017-09-01 DIAGNOSIS — Z6824 Body mass index (BMI) 24.0-24.9, adult: Secondary | ICD-10-CM | POA: Diagnosis not present

## 2017-09-01 DIAGNOSIS — M545 Low back pain: Secondary | ICD-10-CM | POA: Diagnosis not present

## 2017-09-01 DIAGNOSIS — K2270 Barrett's esophagus without dysplasia: Secondary | ICD-10-CM | POA: Diagnosis not present

## 2017-09-01 DIAGNOSIS — I872 Venous insufficiency (chronic) (peripheral): Secondary | ICD-10-CM | POA: Diagnosis not present

## 2017-09-01 DIAGNOSIS — M5136 Other intervertebral disc degeneration, lumbar region: Secondary | ICD-10-CM | POA: Diagnosis not present

## 2017-09-01 DIAGNOSIS — I351 Nonrheumatic aortic (valve) insufficiency: Secondary | ICD-10-CM | POA: Diagnosis not present

## 2017-09-01 DIAGNOSIS — E7849 Other hyperlipidemia: Secondary | ICD-10-CM | POA: Diagnosis not present

## 2017-09-01 DIAGNOSIS — G40309 Generalized idiopathic epilepsy and epileptic syndromes, not intractable, without status epilepticus: Secondary | ICD-10-CM | POA: Diagnosis not present

## 2017-09-08 ENCOUNTER — Other Ambulatory Visit (INDEPENDENT_AMBULATORY_CARE_PROVIDER_SITE_OTHER): Payer: Self-pay | Admitting: Orthopedic Surgery

## 2017-09-23 ENCOUNTER — Ambulatory Visit (INDEPENDENT_AMBULATORY_CARE_PROVIDER_SITE_OTHER): Payer: Medicare Other

## 2017-09-23 ENCOUNTER — Encounter (INDEPENDENT_AMBULATORY_CARE_PROVIDER_SITE_OTHER): Payer: Self-pay | Admitting: Physical Medicine and Rehabilitation

## 2017-09-23 ENCOUNTER — Ambulatory Visit (INDEPENDENT_AMBULATORY_CARE_PROVIDER_SITE_OTHER): Payer: Medicare Other | Admitting: Physical Medicine and Rehabilitation

## 2017-09-23 VITALS — BP 122/72 | HR 76 | Temp 97.7°F

## 2017-09-23 DIAGNOSIS — M47816 Spondylosis without myelopathy or radiculopathy, lumbar region: Secondary | ICD-10-CM

## 2017-09-23 DIAGNOSIS — M419 Scoliosis, unspecified: Secondary | ICD-10-CM | POA: Diagnosis not present

## 2017-09-23 DIAGNOSIS — M546 Pain in thoracic spine: Secondary | ICD-10-CM

## 2017-09-23 DIAGNOSIS — M48061 Spinal stenosis, lumbar region without neurogenic claudication: Secondary | ICD-10-CM

## 2017-09-23 DIAGNOSIS — G8929 Other chronic pain: Secondary | ICD-10-CM | POA: Diagnosis not present

## 2017-09-23 MED ORDER — METHYLPREDNISOLONE ACETATE 80 MG/ML IJ SUSP
80.0000 mg | Freq: Once | INTRAMUSCULAR | Status: AC
  Administered 2017-09-23: 80 mg

## 2017-09-23 MED ORDER — LIDOCAINE HCL (PF) 1 % IJ SOLN
2.0000 mL | Freq: Once | INTRAMUSCULAR | Status: AC
  Administered 2017-09-23: 2 mL

## 2017-09-23 NOTE — Progress Notes (Signed)
States that he has pain in the back at the spine.  + Driver, - BT's, - Dye allergy

## 2017-09-23 NOTE — Patient Instructions (Signed)

## 2017-09-29 ENCOUNTER — Other Ambulatory Visit (INDEPENDENT_AMBULATORY_CARE_PROVIDER_SITE_OTHER): Payer: Self-pay | Admitting: Orthopedic Surgery

## 2017-09-30 NOTE — Telephone Encounter (Signed)
Rx request 

## 2017-10-03 ENCOUNTER — Encounter (INDEPENDENT_AMBULATORY_CARE_PROVIDER_SITE_OTHER): Payer: Self-pay | Admitting: Physical Medicine and Rehabilitation

## 2017-10-03 NOTE — Procedures (Signed)
Lumbar/Thoracic Facet Joint Intra-Articular Injection(s) with Fluoroscopic Guidance  Patient: [name]      Date of Birth: 07/29/1934 MRN: [id number] PCP: Leanna Battles, MD      Visit Date: 09/23/2017   Universal Protocol:    Date/Time: 09/23/2017  Consent Given By: the patient  Position: PRONE   Additional Comments: Vital signs were monitored before and after the procedure. Patient was prepped and draped in the usual sterile fashion. The correct patient, procedure, and site was verified.   Injection Procedure Details:  Procedure Site One Meds Administered:  Meds ordered this encounter  Medications  . lidocaine (PF) (XYLOCAINE) 1 % injection 2 mL  . methylPREDNISolone acetate (DEPO-MEDROL) injection 80 mg     Laterality: Bilateral  Location/Site:  T12-L1  Needle size: 22 guage  Needle type: Spinal  Needle Placement: Articular  Findings:  -Comments: Excellent flow of contrast producing a partial arthrogram.  Procedure Details: The fluoroscope beam is vertically oriented in AP, and the inferior recess is visualized beneath the lower pole of the inferior apophyseal process, which represents the target point for needle insertion. When direct visualization is difficult the target point is located at the medial projection of the vertebral pedicle. The region overlying each aforementioned target is locally anesthetized with a 1 to 2 ml. volume of 1% Lidocaine without Epinephrine.   The spinal needle was inserted into each of the above mentioned facet joints using biplanar fluoroscopic guidance. A 0.25 to 0.5 ml. volume of Isovue-250 was injected and a partial facet joint arthrogram was obtained. A single spot film was obtained of the resulting arthrogram.    One to 1.25 ml of the steroid/anesthetic solution was then injected into each of the facet joints noted above.   Additional Comments:  The patient tolerated the procedure well Dressing: Band-Aid     Post-procedure details: Patient was observed during the procedure. Post-procedure instructions were reviewed.  Patient left the clinic in stable condition.  Pertinent Imaging: MRI LUMBAR SPINE WITHOUT CONTRAST  TECHNIQUE: Multiplanar, multisequence MR imaging of the lumbar spine was performed. No intravenous contrast was administered.  COMPARISON:  03/19/2017 radiography  FINDINGS: Segmentation:  Standard based on available radiography.  Alignment: Exaggerated lumbar lordosis. Grade 1 anterolisthesis at L4-5. Retrolisthesis at T12-L1 and L1-2. Mild dextroscoliosis.  Vertebrae:  No fracture, evidence of discitis, or bone lesion.  Conus medullaris: Extends to the L1-2 level and appears normal.  Paraspinal and other soft tissues: Negative  Disc levels:  T12- L1: Spondylosis and degenerative disc collapse with foraminal crowding. Mild retrolisthesis. No impingement  L1-L2: Disc narrowing and bulging. Facet spurring asymmetric to the left. Moderate left foraminal narrowing. Patent spinal canal.  L2-L3: Degenerative disc narrowing with bulging. Facet spurring and ligament thickening. Patent canal and foramina.  L3-L4: Facet arthropathy with bony and ligamentous hypertrophy. The disc is narrowed and bulging. High-grade spinal stenosis. Patent foramina.  L4-L5: Severe facet arthropathy with joint distortion and anterolisthesis. The disc is narrowed and bulging. Biforaminal stenosis, noncompressive. Advanced spinal stenosis.  L5-S1:Advanced degenerative disc narrowing with disc bulging and endplate degeneration. Negative facets. No impingement.  IMPRESSION: 1. Multilevel facet arthropathy and disc degeneration with mild scoliosis and multilevel listhesis. 2. L4-5 advanced facet arthropathy with anterolisthesis and advanced spinal stenosis. 3. L3-4 advanced facet arthropathy with high-grade spinal stenosis. 4. L1-2 moderate left foraminal  narrowing.   Electronically Signed   By: Neva Seat.D.

## 2017-10-03 NOTE — Progress Notes (Signed)
CON ARGANBRIGHT - 83 y.o. male MRN [id number]  Date of birth: Dec 18, 1933  Office Visit Note: Visit Date: 09/23/2017 PCP: Leanna Battles, MD Referred by: Leanna Battles, MD  Subjective: Chief Complaint  Patient presents with  . Lower Back - Pain   HPI: Mr. [name] is an 83 year old gentleman who is accompanied by his wife who provides some of the history.  His case is complicated by mild cognitive impairment and memory loss.  He is followed by Dr. Leta Baptist.  In our office he is followed by Dr. Sharol Given and Dondra Prader, FN-P for his orthopedic care.  He began having over the last few months worsening left-sided acute low back pain and hip pain.  Dr. Sharol Given treated him with oral prednisone and he continues to stay on oral prednisone at this point.  He said it did help to a degree but he continues to have really thoracic back pain.  His pain complaints today are different than when he saw Dr. Sharol Given at least what he is telling me today.  His pain is right at the T12-L1 area.  It is axial and nonradiating.  It is worse with standing and movement.  He reports history of low back pain but not as much lately.  Nothing down the legs and no paresthesias.  MRI was obtained by Dr. Sharol Given and this is reviewed with the patient today looking at spine models and imaging.  He has had no hip or groin pain or no specific trauma.  He has had no bowel or bladder changes.  He reports a fairly convoluted history of multiple problems and accidents in the past and work history that may have contributed to his back pain.    Review of Systems  Constitutional: Negative for chills, fever, malaise/fatigue and weight loss.  HENT: Negative for hearing loss and sinus pain.   Eyes: Negative for blurred vision, double vision and photophobia.  Respiratory: Negative for cough and shortness of breath.   Cardiovascular: Negative for chest pain, palpitations and leg swelling.  Gastrointestinal: Negative for abdominal pain, nausea and  vomiting.  Genitourinary: Negative for flank pain.  Musculoskeletal: Positive for back pain and joint pain. Negative for myalgias.  Skin: Negative for itching and rash.  Neurological: Negative for tremors, focal weakness and weakness.  Endo/Heme/Allergies: Negative.   Psychiatric/Behavioral: Negative for depression.  All other systems reviewed and are negative.  Otherwise per HPI.  Assessment & Plan: Visit Diagnoses:  1. Spondylosis without myelopathy or radiculopathy, lumbar region   2. Chronic midline thoracic back pain   3. Spinal stenosis of lumbar region without neurogenic claudication   4. Scoliosis of lumbar spine, unspecified scoliosis type     Plan: Findings:  The patient has 2 distinct problems.  He does have fairly severe central canal stenosis multifactorial L4-5 with listhesis of facet arthropathy.  This was likely causing his low back and radicular pain when he was seeing Dr. due to.  Feel like the prednisone may have started and maintained with him was probably help this.  He is asking today about stopping the prednisone he will need to follow instructions by Dr. Sharol Given for this.  He will likely need to be weaned from this.  In terms of his stenosis at this point I do not feel like he needs an injection as he is not having any radicular complaints or low back pain complaints.  In the future he may undergo transforaminal epidural steroid injection at this level versus facet joint blocks.  Today however he is having a distinct other issue which is upper lumbar lower thoracic pain around the T12 area.  Reviewing the images shows retrolisthesis of T12 on L1 and L1 on L2.  He has significant facet arthropathy is really not shown on the report but obviously can see gaping facet joints at T12-L1.  I think the best approach is bilateral facet joint blocks at this area.  He may ultimately do well with ablation in this area although it is a difficult procedure I think it would be something we  could do.  We discussed this at length with him we are going to complete the facet joint blocks today.    Meds & Orders:  Meds ordered this encounter  Medications  . lidocaine (PF) (XYLOCAINE) 1 % injection 2 mL  . methylPREDNISolone acetate (DEPO-MEDROL) injection 80 mg    Orders Placed This Encounter  Procedures  . Facet Injection  . XR C-ARM NO REPORT    Follow-up: Return if symptoms worsen or fail to improve.   Procedures: No procedures performed  No notes on file   Clinical History: MRI LUMBAR SPINE WITHOUT CONTRAST  TECHNIQUE: Multiplanar, multisequence MR imaging of the lumbar spine was performed. No intravenous contrast was administered.  COMPARISON:  03/19/2017 radiography  FINDINGS: Segmentation:  Standard based on available radiography.  Alignment: Exaggerated lumbar lordosis. Grade 1 anterolisthesis at L4-5. Retrolisthesis at T12-L1 and L1-2. Mild dextroscoliosis.  Vertebrae:  No fracture, evidence of discitis, or bone lesion.  Conus medullaris: Extends to the L1-2 level and appears normal.  Paraspinal and other soft tissues: Negative  Disc levels:  T12- L1: Spondylosis and degenerative disc collapse with foraminal crowding. Mild retrolisthesis. No impingement  L1-L2: Disc narrowing and bulging. Facet spurring asymmetric to the left. Moderate left foraminal narrowing. Patent spinal canal.  L2-L3: Degenerative disc narrowing with bulging. Facet spurring and ligament thickening. Patent canal and foramina.  L3-L4: Facet arthropathy with bony and ligamentous hypertrophy. The disc is narrowed and bulging. High-grade spinal stenosis. Patent foramina.  L4-L5: Severe facet arthropathy with joint distortion and anterolisthesis. The disc is narrowed and bulging. Biforaminal stenosis, noncompressive. Advanced spinal stenosis.  L5-S1:Advanced degenerative disc narrowing with disc bulging and endplate degeneration. Negative facets. No  impingement.  IMPRESSION: 1. Multilevel facet arthropathy and disc degeneration with mild scoliosis and multilevel listhesis. 2. L4-5 advanced facet arthropathy with anterolisthesis and advanced spinal stenosis. 3. L3-4 advanced facet arthropathy with high-grade spinal stenosis. 4. L1-2 moderate left foraminal narrowing.   Electronically Signed   By: Monte Fantasia M.D.  He reports that  has never smoked. he has never used smokeless tobacco. No results for input(s): HGBA1C, LABURIC in the last 8760 hours.  Objective:  VS:  HT:    WT:   BMI:     BP:122/72  HR:76bpm  TEMP:97.7 F (36.5 C)(Oral)  RESP:95 % Physical Exam  Constitutional: He is oriented to person, place, and time. He appears well-developed and well-nourished. No distress.  HENT:  Head: Normocephalic and atraumatic.  Eyes: Conjunctivae are normal. Pupils are equal, round, and reactive to light.  Neck: Normal range of motion. Neck supple.  Cardiovascular: Regular rhythm and intact distal pulses.  Pulmonary/Chest: Effort normal. No respiratory distress.  Musculoskeletal:  Examination of the thoracolumbar spine shows increased thoracic kyphosis and subsequent lordosis of the lumbar spine.  He has pain with extension of the lumbar spine but not acutely.  He does have tenderness to palpation over the T12 area in the facets.  Trigger  points noted.  He has good distal strength.  He does ambulate with a cane.  Neurological: He is alert and oriented to person, place, and time.  Skin: Skin is warm and dry. No rash noted. No erythema.  Psychiatric: He has a normal mood and affect.  Nursing note and vitals reviewed.   Ortho Exam Imaging: No results found.  Past Medical/Family/Surgical/Social History: Medications & Allergies reviewed per EMR Patient Active Problem List   Diagnosis Date Noted  . Pain in right ankle and joints of right foot 03/03/2017  . Acute left-sided low back pain without sciatica 03/03/2017  .  Post-traumatic osteoarthritis, right ankle and foot 03/03/2017  . ACUTE BRONCHITIS 09/05/2009  . ALLERGIC RHINITIS 09/26/2007  . Asthma with bronchitis 09/26/2007  . ESOPHAGEAL REFLUX 09/26/2007  . SEIZURE DISORDER 09/26/2007   Past Medical History:  Diagnosis Date  . Allergic rhinitis   . Asthma   . Barrett syndrome   . Depressive disorder, not elsewhere classified   . Dizziness and giddiness   . Esophageal reflux   . 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 Memory loss   . Other and unspecified hyperlipidemia   . Seizure disorder ([place])   . Unspecified hypothyroidism    Family History  Problem Relation Age of Onset  . Heart disease Mother   . Memory loss Mother   . Heart disease Father   . Asthma Sister   . Parkinson's disease Sister    Past Surgical History:  Procedure Laterality Date  . APPENDECTOMY    . right hip replacement  2005   Social History   Occupational History  . 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 Smoking status: Never Smoker  . Smokeless tobacco: Never Used  Substance and Sexual Activity  . Alcohol use: No    Alcohol/week: 0.0 oz  . Drug use: No  . Sexual activity: Not on file

## 2017-10-07 ENCOUNTER — Telehealth (INDEPENDENT_AMBULATORY_CARE_PROVIDER_SITE_OTHER): Payer: Self-pay | Admitting: Orthopedic Surgery

## 2017-10-07 NOTE — Telephone Encounter (Signed)
Patient called wondering if he should still be taking Prednisone. Please advise # [phone]. He said it would be okay to leave a message if he doesn't answer.

## 2017-10-08 NOTE — Telephone Encounter (Signed)
Have him wean off the prednisone

## 2017-10-09 NOTE — Telephone Encounter (Signed)
I called and spoke with patient advised him to be weaning off medication. If he is out now, he does not need refill.

## 2017-10-13 DIAGNOSIS — R748 Abnormal levels of other serum enzymes: Secondary | ICD-10-CM | POA: Diagnosis not present

## 2017-10-13 DIAGNOSIS — Z7982 Long term (current) use of aspirin: Secondary | ICD-10-CM | POA: Diagnosis not present

## 2017-10-13 DIAGNOSIS — J45909 Unspecified asthma, uncomplicated: Secondary | ICD-10-CM | POA: Diagnosis not present

## 2017-10-13 DIAGNOSIS — K219 Gastro-esophageal reflux disease without esophagitis: Secondary | ICD-10-CM | POA: Diagnosis not present

## 2017-10-13 DIAGNOSIS — Z8249 Family history of ischemic heart disease and other diseases of the circulatory system: Secondary | ICD-10-CM | POA: Diagnosis not present

## 2017-10-13 DIAGNOSIS — I471 Supraventricular tachycardia: Secondary | ICD-10-CM | POA: Diagnosis not present

## 2017-10-13 DIAGNOSIS — Z7952 Long term (current) use of systemic steroids: Secondary | ICD-10-CM | POA: Diagnosis not present

## 2017-10-13 DIAGNOSIS — Z79890 Hormone replacement therapy (postmenopausal): Secondary | ICD-10-CM | POA: Diagnosis not present

## 2017-10-13 DIAGNOSIS — R0789 Other chest pain: Secondary | ICD-10-CM | POA: Diagnosis not present

## 2017-10-13 DIAGNOSIS — E782 Mixed hyperlipidemia: Secondary | ICD-10-CM | POA: Diagnosis not present

## 2017-10-13 DIAGNOSIS — G40909 Epilepsy, unspecified, not intractable, without status epilepticus: Secondary | ICD-10-CM | POA: Diagnosis not present

## 2017-10-13 DIAGNOSIS — E039 Hypothyroidism, unspecified: Secondary | ICD-10-CM | POA: Diagnosis not present

## 2017-10-13 DIAGNOSIS — Z79899 Other long term (current) drug therapy: Secondary | ICD-10-CM | POA: Diagnosis not present

## 2017-10-13 DIAGNOSIS — K2270 Barrett's esophagus without dysplasia: Secondary | ICD-10-CM | POA: Diagnosis not present

## 2017-10-13 DIAGNOSIS — I2510 Atherosclerotic heart disease of native coronary artery without angina pectoris: Secondary | ICD-10-CM | POA: Diagnosis not present

## 2017-10-13 DIAGNOSIS — R079 Chest pain, unspecified: Secondary | ICD-10-CM | POA: Diagnosis not present

## 2017-10-14 DIAGNOSIS — R42 Dizziness and giddiness: Secondary | ICD-10-CM | POA: Diagnosis not present

## 2017-10-14 DIAGNOSIS — I358 Other nonrheumatic aortic valve disorders: Secondary | ICD-10-CM | POA: Diagnosis not present

## 2017-10-14 DIAGNOSIS — K219 Gastro-esophageal reflux disease without esophagitis: Secondary | ICD-10-CM | POA: Diagnosis present

## 2017-10-14 DIAGNOSIS — Z79890 Hormone replacement therapy (postmenopausal): Secondary | ICD-10-CM | POA: Diagnosis not present

## 2017-10-14 DIAGNOSIS — R748 Abnormal levels of other serum enzymes: Secondary | ICD-10-CM | POA: Diagnosis not present

## 2017-10-14 DIAGNOSIS — I371 Nonrheumatic pulmonary valve insufficiency: Secondary | ICD-10-CM | POA: Diagnosis not present

## 2017-10-14 DIAGNOSIS — E782 Mixed hyperlipidemia: Secondary | ICD-10-CM | POA: Diagnosis present

## 2017-10-14 DIAGNOSIS — G40909 Epilepsy, unspecified, not intractable, without status epilepticus: Secondary | ICD-10-CM | POA: Diagnosis present

## 2017-10-14 DIAGNOSIS — Z7982 Long term (current) use of aspirin: Secondary | ICD-10-CM | POA: Diagnosis not present

## 2017-10-14 DIAGNOSIS — R0789 Other chest pain: Secondary | ICD-10-CM | POA: Diagnosis not present

## 2017-10-14 DIAGNOSIS — J45909 Unspecified asthma, uncomplicated: Secondary | ICD-10-CM | POA: Diagnosis present

## 2017-10-14 DIAGNOSIS — R079 Chest pain, unspecified: Secondary | ICD-10-CM | POA: Diagnosis not present

## 2017-10-14 DIAGNOSIS — Z79899 Other long term (current) drug therapy: Secondary | ICD-10-CM | POA: Diagnosis not present

## 2017-10-14 DIAGNOSIS — Z8249 Family history of ischemic heart disease and other diseases of the circulatory system: Secondary | ICD-10-CM | POA: Diagnosis not present

## 2017-10-14 DIAGNOSIS — R7989 Other specified abnormal findings of blood chemistry: Secondary | ICD-10-CM

## 2017-10-14 DIAGNOSIS — E039 Hypothyroidism, unspecified: Secondary | ICD-10-CM | POA: Diagnosis present

## 2017-10-14 DIAGNOSIS — Z7952 Long term (current) use of systemic steroids: Secondary | ICD-10-CM | POA: Diagnosis not present

## 2017-10-14 DIAGNOSIS — R778 Other specified abnormalities of plasma proteins: Secondary | ICD-10-CM | POA: Insufficient documentation

## 2017-10-14 DIAGNOSIS — I471 Supraventricular tachycardia: Secondary | ICD-10-CM | POA: Diagnosis not present

## 2017-10-14 DIAGNOSIS — I2510 Atherosclerotic heart disease of native coronary artery without angina pectoris: Secondary | ICD-10-CM | POA: Diagnosis present

## 2017-10-14 DIAGNOSIS — K2270 Barrett's esophagus without dysplasia: Secondary | ICD-10-CM | POA: Diagnosis present

## 2017-10-15 ENCOUNTER — Other Ambulatory Visit (INDEPENDENT_AMBULATORY_CARE_PROVIDER_SITE_OTHER): Payer: Self-pay | Admitting: Orthopedic Surgery

## 2017-10-23 DIAGNOSIS — Z8679 Personal history of other diseases of the circulatory system: Secondary | ICD-10-CM | POA: Diagnosis not present

## 2017-10-23 DIAGNOSIS — R5383 Other fatigue: Secondary | ICD-10-CM | POA: Diagnosis not present

## 2017-10-23 DIAGNOSIS — E038 Other specified hypothyroidism: Secondary | ICD-10-CM | POA: Diagnosis not present

## 2017-10-23 DIAGNOSIS — K219 Gastro-esophageal reflux disease without esophagitis: Secondary | ICD-10-CM | POA: Diagnosis not present

## 2017-10-23 DIAGNOSIS — Z6825 Body mass index (BMI) 25.0-25.9, adult: Secondary | ICD-10-CM | POA: Diagnosis not present

## 2017-10-23 DIAGNOSIS — E7849 Other hyperlipidemia: Secondary | ICD-10-CM | POA: Diagnosis not present

## 2017-11-04 ENCOUNTER — Encounter: Payer: Self-pay | Admitting: Cardiology

## 2017-11-19 NOTE — Progress Notes (Signed)
Cardiology Office Note   Date:  11/20/2017   ID:  [name], DOB 11/21/1933, MRN [id number]  PCP:  Leanna Battles, MD  Cardiologist:   No primary care provider on file. Referring:  Leanna Battles, MD  Chief Complaint  Patient presents with  . SVT      History of Present Illness: [name] is a 84 y.o. male who presents for follow up of SVT.  He was hospitalized over Christmas at [facility] and I have reviewed these records.  He presented with a supraventricular tachycardia that appeared to be short RP interval with a rate of 183.  He was managed medically and converted to sinus rhythm after 6 mg of adenosine.  He did have elevated cardiac enzymes but I refused a cardiac catheterization and he had only minimal coronary plaque.  Echocardiogram was unremarkable.  He was sent home on metoprolol.  Since that time he has done well.  He is always been an active gentleman and exercises routinely.  He has some minor orthopedic issues but he otherwise gets around well and he denies any ongoing palpitations, presyncope or syncope.  He denies any chest pressure, neck or arm discomfort.  He has no shortness of breath, PND or orthopnea.  He did have an echo in 2012 which was essentially normal.    Past Medical History:  Diagnosis Date  . Allergic rhinitis   . Asthma   . Barrett syndrome   . Depressive disorder, not elsewhere classified   . Dizziness and giddiness   . Esophageal reflux   . 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 Memory loss   . Other and unspecified hyperlipidemia   . Seizure disorder ([place])   . Unspecified hypothyroidism     Past Surgical History:  Procedure Laterality Date  . APPENDECTOMY    . right hip replacement  2005     Current Outpatient Medications  Medication Sig Dispense Refill  . acetaminophen (TYLENOL) 500 MG tablet Take 500 mg by  mouth every 6 (six) hours as needed.    . donepezil (ARICEPT) 10 MG tablet Take 10 mg by mouth at bedtime.    . finasteride (PROSCAR) 5 MG tablet Take 1 tablet by mouth daily.  0  . Levetiracetam 750 MG TB24 Take 1 tablet (750 mg total) by mouth at bedtime. 90 each 4  . levothyroxine (SYNTHROID, LEVOTHROID) 100 MCG tablet Take 100 mcg by mouth daily before breakfast.    . Multiple Vitamin (MULTIVITAMIN) tablet Take 1 tablet by mouth daily.      Marland Kitchen omeprazole (PRILOSEC) 20 MG capsule Take 20 mg by mouth daily.      . pravastatin (PRAVACHOL) 20 MG tablet Take 1 tablet by mouth daily.    . Probiotic Product (ALIGN PO) Take by mouth.    . Pyridoxine HCl (VITAMIN B-6) 500 MG tablet Take 500 mg by mouth daily.      . vitamin B-12 (CYANOCOBALAMIN) 1000 MCG tablet Take 1,000 mcg by mouth daily.      . VOLTAREN 1 % GEL   0   No current facility-administered medications for this visit.     Allergies:   Dilantin [phenytoin sodium extended]; Lamotrigine; and Sulfonamide derivatives    Social History:  The patient  reports that  has never smoked. he has never used smokeless tobacco. He reports that he does not drink alcohol or use drugs.   Family  History:  The patient's family history includes Asthma in his sister; Heart disease in his mother; Memory loss in his mother; Parkinson's disease in his sister.    ROS:  Please see the history of present illness.   Otherwise, review of systems are positive for none.   All other systems are reviewed and negative.    PHYSICAL EXAM: VS:  BP 122/68   Pulse 70   Ht 5\' 2"  (1.575 m)   Wt 149 lb (67.6 kg)   BMI 27.25 kg/m  , BMI Body mass index is 27.25 kg/m. GENERAL:  Well appearing HEENT:  Pupils equal round and reactive, fundi not visualized, oral mucosa unremarkable NECK:  No jugular venous distention, waveform within normal limits, carotid upstroke brisk and symmetric, no bruits, no thyromegaly LYMPHATICS:  No cervical, inguinal adenopathy LUNGS:   Clear to auscultation bilaterally BACK:  No CVA tenderness CHEST:  Unremarkable HEART:  PMI not displaced or sustained,S1 and S2 within normal limits, no S3, no S4, no clicks, no rubs, no murmurs ABD:  Flat, positive bowel sounds normal in frequency in pitch, no bruits, no rebound, no guarding, no midline pulsatile mass, no hepatomegaly, no splenomegaly EXT:  2 plus pulses throughout, no edema, no cyanosis no clubbing SKIN:  No rashes no nodules NEURO:  Cranial nerves II through XII grossly intact, motor grossly intact throughout PSYCH:  Cognitively intact, oriented to person place and time    EKG:  EKG is ordered today.    Recent Labs: No results found for requested labs within last 8760 hours.    Lipid Panel No results found for: CHOL, TRIG, HDL, CHOLHDL, VLDL, LDLCALC, LDLDIRECT    Wt Readings from Last 3 Encounters:  11/20/17 149 lb (67.6 kg)  07/08/17 150 lb 9.6 oz (68.3 kg)  03/19/17 152 lb (68.9 kg)      Other studies Reviewed: Additional studies/ records that were reviewed today include: Records from [place]. Review of the above records demonstrates:  Please see elsewhere in the note.     ASSESSMENT AND PLAN:  SVT: We discussed vagal maneuvers.  This was his one and only episode phosphoric.  No further testing is indicated.  He will continue on the beta-blocker.  At this point I do not think further testing or change in therapy is indicated.  ELEVATED TROPONIN:  This was likely demand ischemia.  No further work up is indicated.    Current medicines are reviewed at length with the patient today.  The patient does not have concerns regarding medicines.  The following changes have been made:  no change  Labs/ tests ordered today include: None  Orders Placed This Encounter  Procedures  . EKG 12-Lead     Disposition:   FU with me in six months and then as needed.      Signed, Minus Breeding, MD  11/20/2017 1:10 PM    [facility]

## 2017-11-20 ENCOUNTER — Ambulatory Visit (INDEPENDENT_AMBULATORY_CARE_PROVIDER_SITE_OTHER): Payer: Medicare Other | Admitting: Cardiology

## 2017-11-20 ENCOUNTER — Encounter: Payer: Self-pay | Admitting: Cardiology

## 2017-11-20 VITALS — BP 122/68 | HR 70 | Ht 62.0 in | Wt 149.0 lb

## 2017-11-20 DIAGNOSIS — I471 Supraventricular tachycardia: Secondary | ICD-10-CM | POA: Diagnosis not present

## 2017-11-20 NOTE — Patient Instructions (Signed)
Medication Instructions:  Continue current medications  If you need a refill on your cardiac medications before your next appointment, please call your pharmacy.  Labwork: None Ordered  Testing/Procedures: None Ordered  Follow-Up: Your physician wants you to follow-up in: 4 Months.    Thank you for choosing [facility]!!      

## 2018-01-05 DIAGNOSIS — E7849 Other hyperlipidemia: Secondary | ICD-10-CM | POA: Diagnosis not present

## 2018-01-05 DIAGNOSIS — R82998 Other abnormal findings in urine: Secondary | ICD-10-CM | POA: Diagnosis not present

## 2018-01-05 DIAGNOSIS — E038 Other specified hypothyroidism: Secondary | ICD-10-CM | POA: Diagnosis not present

## 2018-01-05 DIAGNOSIS — Z125 Encounter for screening for malignant neoplasm of prostate: Secondary | ICD-10-CM | POA: Diagnosis not present

## 2018-01-12 DIAGNOSIS — M25571 Pain in right ankle and joints of right foot: Secondary | ICD-10-CM | POA: Diagnosis not present

## 2018-01-12 DIAGNOSIS — R2681 Unsteadiness on feet: Secondary | ICD-10-CM | POA: Diagnosis not present

## 2018-01-12 DIAGNOSIS — E038 Other specified hypothyroidism: Secondary | ICD-10-CM | POA: Diagnosis not present

## 2018-01-12 DIAGNOSIS — Z0000 Encounter for general adult medical examination without abnormal findings: Secondary | ICD-10-CM | POA: Diagnosis not present

## 2018-01-12 DIAGNOSIS — M545 Low back pain: Secondary | ICD-10-CM | POA: Diagnosis not present

## 2018-01-12 DIAGNOSIS — Z6824 Body mass index (BMI) 24.0-24.9, adult: Secondary | ICD-10-CM | POA: Diagnosis not present

## 2018-01-12 DIAGNOSIS — I351 Nonrheumatic aortic (valve) insufficiency: Secondary | ICD-10-CM | POA: Diagnosis not present

## 2018-01-12 DIAGNOSIS — K2270 Barrett's esophagus without dysplasia: Secondary | ICD-10-CM | POA: Diagnosis not present

## 2018-01-12 DIAGNOSIS — Z1389 Encounter for screening for other disorder: Secondary | ICD-10-CM | POA: Diagnosis not present

## 2018-01-12 DIAGNOSIS — I872 Venous insufficiency (chronic) (peripheral): Secondary | ICD-10-CM | POA: Diagnosis not present

## 2018-01-12 DIAGNOSIS — G309 Alzheimer's disease, unspecified: Secondary | ICD-10-CM | POA: Diagnosis not present

## 2018-01-12 DIAGNOSIS — G40309 Generalized idiopathic epilepsy and epileptic syndromes, not intractable, without status epilepticus: Secondary | ICD-10-CM | POA: Diagnosis not present

## 2018-02-24 DIAGNOSIS — R197 Diarrhea, unspecified: Secondary | ICD-10-CM | POA: Diagnosis not present

## 2018-02-27 DIAGNOSIS — K5730 Diverticulosis of large intestine without perforation or abscess without bleeding: Secondary | ICD-10-CM | POA: Diagnosis not present

## 2018-02-27 DIAGNOSIS — R197 Diarrhea, unspecified: Secondary | ICD-10-CM | POA: Diagnosis not present

## 2018-02-27 DIAGNOSIS — D126 Benign neoplasm of colon, unspecified: Secondary | ICD-10-CM | POA: Diagnosis not present

## 2018-03-03 DIAGNOSIS — D126 Benign neoplasm of colon, unspecified: Secondary | ICD-10-CM | POA: Diagnosis not present

## 2018-03-09 DIAGNOSIS — J449 Chronic obstructive pulmonary disease, unspecified: Secondary | ICD-10-CM | POA: Diagnosis not present

## 2018-03-09 DIAGNOSIS — J45991 Cough variant asthma: Secondary | ICD-10-CM | POA: Diagnosis not present

## 2018-03-09 DIAGNOSIS — R05 Cough: Secondary | ICD-10-CM | POA: Diagnosis not present

## 2018-03-19 ENCOUNTER — Ambulatory Visit: Payer: Medicare Other | Admitting: Cardiology

## 2018-03-19 NOTE — Progress Notes (Signed)
Cardiology Office Note   Date:  03/20/2018   ID:  [name], DOB [birth year], MRN [id number]  PCP:  Leanna Battles, MD  Cardiologist:   No primary care provider on file. Referring:  Leanna Battles, MD  Chief Complaint  Patient presents with  . SVT      History of Present Illness: [name] is a 84 y.o. male who presents for follow up of SVT.  He was hospitalized over Christmas at [facility] .  He presented with a supraventricular tachycardia that appeared to be short RP interval with a rate of 183.  He was managed medically and converted to sinus rhythm after 6 mg of adenosine.  He did have elevated cardiac enzymes but I reviewed a cardiac catheterization and he had only minimal coronary plaque.  Echocardiogram was unremarkable.  He was sent home on metoprolol.  Since that time he has he has had no further SVT.  He is doing well.  He walks for exercise.  The patient denies any new symptoms such as chest discomfort, neck or arm discomfort. There has been no new shortness of breath, PND or orthopnea. There have been no reported palpitations, presyncope or syncope    Past Medical History:  Diagnosis Date  . Allergic rhinitis   . Asthma   . Barrett syndrome   . Depressive disorder, not elsewhere classified   . Dizziness and giddiness   . Esophageal reflux   . 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 Memory loss   . Other and unspecified hyperlipidemia   . Seizure disorder ([place])   . Unspecified hypothyroidism     Past Surgical History:  Procedure Laterality Date  . APPENDECTOMY    . right hip replacement  2005     Current Outpatient Medications  Medication Sig Dispense Refill  . acetaminophen (TYLENOL) 500 MG tablet Take 500 mg by mouth every 6 (six) hours as needed.    . donepezil (ARICEPT) 10 MG tablet Take 10 mg by mouth at bedtime.    . finasteride  (PROSCAR) 5 MG tablet Take 1 tablet by mouth daily.  0  . Levetiracetam 750 MG TB24 Take 1 tablet (750 mg total) by mouth at bedtime. 90 each 4  . levothyroxine (SYNTHROID, LEVOTHROID) 100 MCG tablet Take 100 mcg by mouth daily before breakfast.    . Multiple Vitamin (MULTIVITAMIN) tablet Take 1 tablet by mouth daily.      Marland Kitchen omeprazole (PRILOSEC) 20 MG capsule Take 20 mg by mouth daily.      . pravastatin (PRAVACHOL) 20 MG tablet Take 1 tablet by mouth daily.    . Probiotic Product (ALIGN PO) Take by mouth.    . Pyridoxine HCl (VITAMIN B-6) 500 MG tablet Take 500 mg by mouth daily.      . vitamin B-12 (CYANOCOBALAMIN) 1000 MCG tablet Take 1,000 mcg by mouth daily.      . VOLTAREN 1 % GEL   0   No current facility-administered medications for this visit.     Allergies:   Dilantin [phenytoin sodium extended]; Lamotrigine; and Sulfonamide derivatives     ROS:  Please see the history of present illness.   Otherwise, review of systems are positive for none.   All other systems are reviewed and negative.    PHYSICAL EXAM: VS:  BP 110/69   Pulse 70   Ht 5\' 2"  (1.575 m)   Wt 142 lb (  64.4 kg)   SpO2 94%   BMI 25.97 kg/m  , BMI Body mass index is 25.97 kg/m.  GENERAL:  Well appearing NECK:  No jugular venous distention, waveform within normal limits, carotid upstroke brisk and symmetric, no bruits, no thyromegaly LUNGS:  Clear to auscultation bilaterally CHEST:  Unremarkable HEART:  PMI not displaced or sustained,S1 and S2 within normal limits, no S3, no S4, no clicks, no rubs, no murmurs ABD:  Flat, positive bowel sounds normal in frequency in pitch, no bruits, no rebound, no guarding, no midline pulsatile mass, no hepatomegaly, no splenomegaly EXT:  2 plus pulses throughout, no edema, no cyanosis no clubbing    EKG:  EKG is not ordered today.    Recent Labs: No results found for requested labs within last 8760 hours.    Lipid Panel No results found for: CHOL, TRIG, HDL,  CHOLHDL, VLDL, LDLCALC, LDLDIRECT    Wt Readings from Last 3 Encounters:  03/20/18 142 lb (64.4 kg)  11/20/17 149 lb (67.6 kg)  07/08/17 150 lb 9.6 oz (68.3 kg)      Other studies Reviewed: Additional studies/ records that were reviewed today include: None Review of the above records demonstrates:     ASSESSMENT AND PLAN:  SVT:    He has had no further symptoms.   We again discussed vagal maneuvers.  This was his one and only episode.   No further testing.    Current medicines are reviewed at length with the patient today.  The patient does not have concerns regarding medicines.  The following changes have been made:  None  Labs/ tests ordered today include: None  No orders of the defined types were placed in this encounter.    Disposition:   FU with me in as needed.   Signed, Minus Breeding, MD  03/20/2018 3:10 PM    [facility]

## 2018-03-20 ENCOUNTER — Encounter: Payer: Self-pay | Admitting: Cardiology

## 2018-03-20 ENCOUNTER — Ambulatory Visit (INDEPENDENT_AMBULATORY_CARE_PROVIDER_SITE_OTHER): Payer: Medicare Other | Admitting: Cardiology

## 2018-03-20 VITALS — BP 110/69 | HR 70 | Ht 62.0 in | Wt 142.0 lb

## 2018-03-20 DIAGNOSIS — I471 Supraventricular tachycardia: Secondary | ICD-10-CM

## 2018-03-20 NOTE — Patient Instructions (Signed)
Medication Instructions:  Continue current medications  If you need a refill on your cardiac medications before your next appointment, please call your pharmacy.  Labwork: None Ordered  Testing/Procedures: None Ordered  Follow-Up: Your physician wants you to follow-up in: As Needed.      Thank you for choosing [facility]!!       

## 2018-04-10 DIAGNOSIS — F0390 Unspecified dementia without behavioral disturbance: Secondary | ICD-10-CM | POA: Diagnosis not present

## 2018-04-10 DIAGNOSIS — E039 Hypothyroidism, unspecified: Secondary | ICD-10-CM | POA: Diagnosis not present

## 2018-04-10 DIAGNOSIS — E785 Hyperlipidemia, unspecified: Secondary | ICD-10-CM | POA: Diagnosis not present

## 2018-04-10 DIAGNOSIS — I471 Supraventricular tachycardia: Secondary | ICD-10-CM | POA: Diagnosis not present

## 2018-04-10 DIAGNOSIS — G309 Alzheimer's disease, unspecified: Secondary | ICD-10-CM | POA: Diagnosis not present

## 2018-04-10 DIAGNOSIS — R000 Tachycardia, unspecified: Secondary | ICD-10-CM | POA: Diagnosis not present

## 2018-04-10 DIAGNOSIS — R748 Abnormal levels of other serum enzymes: Secondary | ICD-10-CM | POA: Diagnosis not present

## 2018-04-10 DIAGNOSIS — F0280 Dementia in other diseases classified elsewhere without behavioral disturbance: Secondary | ICD-10-CM | POA: Diagnosis not present

## 2018-04-10 DIAGNOSIS — J9811 Atelectasis: Secondary | ICD-10-CM | POA: Diagnosis not present

## 2018-04-10 DIAGNOSIS — R002 Palpitations: Secondary | ICD-10-CM | POA: Diagnosis not present

## 2018-04-10 DIAGNOSIS — E782 Mixed hyperlipidemia: Secondary | ICD-10-CM | POA: Diagnosis not present

## 2018-04-10 DIAGNOSIS — K219 Gastro-esophageal reflux disease without esophagitis: Secondary | ICD-10-CM | POA: Diagnosis not present

## 2018-04-10 DIAGNOSIS — R0602 Shortness of breath: Secondary | ICD-10-CM | POA: Diagnosis not present

## 2018-04-11 DIAGNOSIS — E039 Hypothyroidism, unspecified: Secondary | ICD-10-CM | POA: Insufficient documentation

## 2018-04-11 DIAGNOSIS — I471 Supraventricular tachycardia: Secondary | ICD-10-CM | POA: Diagnosis not present

## 2018-04-11 DIAGNOSIS — E782 Mixed hyperlipidemia: Secondary | ICD-10-CM | POA: Diagnosis present

## 2018-04-11 DIAGNOSIS — Z7982 Long term (current) use of aspirin: Secondary | ICD-10-CM | POA: Diagnosis not present

## 2018-04-11 DIAGNOSIS — J9811 Atelectasis: Secondary | ICD-10-CM | POA: Diagnosis present

## 2018-04-11 DIAGNOSIS — F0390 Unspecified dementia without behavioral disturbance: Secondary | ICD-10-CM | POA: Diagnosis present

## 2018-04-11 DIAGNOSIS — R569 Unspecified convulsions: Secondary | ICD-10-CM | POA: Diagnosis present

## 2018-04-11 DIAGNOSIS — R0789 Other chest pain: Secondary | ICD-10-CM | POA: Diagnosis not present

## 2018-04-11 DIAGNOSIS — Z66 Do not resuscitate: Secondary | ICD-10-CM | POA: Diagnosis present

## 2018-04-11 DIAGNOSIS — K219 Gastro-esophageal reflux disease without esophagitis: Secondary | ICD-10-CM | POA: Diagnosis present

## 2018-04-11 DIAGNOSIS — Z79890 Hormone replacement therapy (postmenopausal): Secondary | ICD-10-CM | POA: Diagnosis not present

## 2018-04-12 DIAGNOSIS — R0789 Other chest pain: Secondary | ICD-10-CM | POA: Diagnosis not present

## 2018-04-13 ENCOUNTER — Telehealth: Payer: Self-pay | Admitting: Cardiology

## 2018-04-13 MED ORDER — ASPIRIN EC 81 MG PO TBEC
81.00 | DELAYED_RELEASE_TABLET | ORAL | Status: DC
Start: 2018-04-13 — End: 2018-04-13

## 2018-04-13 MED ORDER — FINASTERIDE 5 MG PO TABS
5.00 | ORAL_TABLET | ORAL | Status: DC
Start: 2018-04-13 — End: 2018-04-13

## 2018-04-13 MED ORDER — POLYETHYLENE GLYCOL 3350 17 G PO PACK
17.00 | PACK | ORAL | Status: DC
Start: ? — End: 2018-04-13

## 2018-04-13 MED ORDER — GENERIC EXTERNAL MEDICATION
750.00 | Status: DC
Start: 2018-04-13 — End: 2018-04-13

## 2018-04-13 MED ORDER — MEMANTINE HCL 10 MG PO TABS
10.00 | ORAL_TABLET | ORAL | Status: DC
Start: 2018-04-12 — End: 2018-04-13

## 2018-04-13 MED ORDER — ATORVASTATIN CALCIUM 40 MG PO TABS
40.00 | ORAL_TABLET | ORAL | Status: DC
Start: 2018-04-12 — End: 2018-04-13

## 2018-04-13 MED ORDER — METOPROLOL TARTRATE 25 MG PO TABS
25.00 | ORAL_TABLET | ORAL | Status: DC
Start: 2018-04-12 — End: 2018-04-13

## 2018-04-13 MED ORDER — HEPARIN SODIUM (PORCINE) 5000 UNIT/ML IJ SOLN
5000.00 | INTRAMUSCULAR | Status: DC
Start: 2018-04-12 — End: 2018-04-13

## 2018-04-13 MED ORDER — DONEPEZIL HCL 5 MG PO TABS
10.00 | ORAL_TABLET | ORAL | Status: DC
Start: 2018-04-12 — End: 2018-04-13

## 2018-04-13 MED ORDER — ONDANSETRON 8 MG PO TBDP
8.00 | ORAL_TABLET | ORAL | Status: DC
Start: ? — End: 2018-04-13

## 2018-04-13 MED ORDER — PANTOPRAZOLE SODIUM 40 MG PO TBEC
40.00 | DELAYED_RELEASE_TABLET | ORAL | Status: DC
Start: 2018-04-13 — End: 2018-04-13

## 2018-04-13 MED ORDER — ONDANSETRON 4 MG PO TBDP
4.00 | ORAL_TABLET | ORAL | Status: DC
Start: ? — End: 2018-04-13

## 2018-04-13 MED ORDER — LEVOTHYROXINE SODIUM 50 MCG PO TABS
50.00 | ORAL_TABLET | ORAL | Status: DC
Start: 2018-04-13 — End: 2018-04-13

## 2018-04-13 MED ORDER — GENERIC EXTERNAL MEDICATION
Status: DC
Start: ? — End: 2018-04-13

## 2018-04-13 MED ORDER — ACETAMINOPHEN 500 MG PO TABS
1000.00 | ORAL_TABLET | ORAL | Status: DC
Start: ? — End: 2018-04-13

## 2018-04-13 NOTE — Telephone Encounter (Signed)
Spoke with pt wife, they were rescheduled to tomorrow morning. Records available through my chart

## 2018-04-13 NOTE — Progress Notes (Signed)
Cardiology Office Note   Date:  04/14/2018   ID:  JAQUARI RECKNER, DOB [birth year], MRN [id number]  PCP:  Leanna Battles, MD  Cardiologist:   No primary care provider on file. Referring:  Leanna Battles, MD  Chief Complaint  Patient presents with  . SVT      History of Present Illness: [name] is a 84 y.o. male who presents for follow up of SVT.  He was hospitalized over Christmas at [facility] .  He presented with a supraventricular tachycardia that appeared to be short RP interval with a rate of 183.  He was managed medically and converted to sinus rhythm after 6 mg of adenosine.  He did have elevated cardiac enzymes but I reviewed a cardiac catheterization and he had only minimal coronary plaque.  Echocardiogram was unremarkable.  He was sent home on metoprolol.  He was last in the the hospital in [facility] a few days ago and again needed adenosine for SVT.  I reviewed these records for this visit.    He was again noted to have increased enzymes.  He did have a normal echo.  He was given beta blockers with some doses being held secondary to bradycardia.  He called to be seen in follow up.   He reported that he was talking with family and then became dizzy.  His HR was elevated.  He felt presyncopal.  The patient denies any new symptoms such as chest discomfort, neck or arm discomfort. There has been no new shortness of breath, PND or orthopnea. There have been no reported palpitations, presyncope or syncope.  He has not been able to take beta blockers most days because of low HRs.  He was taken off of beta blocker in the past secondary to low HR.    Past Medical History:  Diagnosis Date  . Allergic rhinitis   . Asthma   . Barrett syndrome   . Depressive disorder, not elsewhere classified   . Dizziness and giddiness   . Esophageal reflux   . Localization-related (focal) (partial) epilepsy and epileptic syndromes with complex partial seizures, with intractable epilepsy     . Memory loss   . Other and unspecified hyperlipidemia   . Seizure disorder ([place])   . Unspecified hypothyroidism     Past Surgical History:  Procedure Laterality Date  . APPENDECTOMY    . right hip replacement  2005     Current Outpatient Medications  Medication Sig Dispense Refill  . acetaminophen (TYLENOL) 500 MG tablet Take 500 mg by mouth every 6 (six) hours as needed.    Marland Kitchen atorvastatin (LIPITOR) 40 MG tablet Take by mouth.    . donepezil (ARICEPT) 10 MG tablet Take 10 mg by mouth at bedtime.    . finasteride (PROSCAR) 5 MG tablet Take 1 tablet by mouth daily.  0  . Levetiracetam 750 MG TB24 Take 1 tablet (750 mg total) by mouth at bedtime. 90 each 4  . levothyroxine (SYNTHROID, LEVOTHROID) 100 MCG tablet Take 100 mcg by mouth daily before breakfast.    . metoprolol tartrate (LOPRESSOR) 25 MG tablet Take by mouth.    . Multiple Vitamin (MULTIVITAMIN) tablet Take 1 tablet by mouth daily.      Marland Kitchen omeprazole (PRILOSEC) 20 MG capsule Take 20 mg by mouth daily.      . Probiotic Product (ALIGN PO) Take by mouth.    . Pyridoxine HCl (VITAMIN B-6) 500 MG tablet Take 500 mg by mouth daily.      Marland Kitchen  vitamin B-12 (CYANOCOBALAMIN) 1000 MCG tablet Take 1,000 mcg by mouth daily.      . VOLTAREN 1 % GEL   0   No current facility-administered medications for this visit.     Allergies:   Dilantin [phenytoin sodium extended]; Lamotrigine; and Sulfonamide derivatives     ROS:  Please see the history of present illness.   Otherwise, review of systems are positive for fatigue, decreased memory.   All other systems are reviewed and negative.    PHYSICAL EXAM: VS:  BP 111/67   Pulse 62   Ht 5\' 2"  (1.575 m)   Wt 142 lb 12.8 oz (64.8 kg)   BMI 26.12 kg/m  , BMI Body mass index is 26.12 kg/m.  GENERAL:  Well appearing NECK:  No jugular venous distention, waveform within normal limits, carotid upstroke brisk and symmetric, no bruits, no thyromegaly LUNGS:  Clear to auscultation  bilaterally CHEST:  Unremarkable HEART:  PMI not displaced or sustained,S1 and S2 within normal limits, no S3, no S4, no clicks, no rubs, no murmurs ABD:  Flat, positive bowel sounds normal in frequency in pitch, no bruits, no rebound, no guarding, no midline pulsatile mass, no hepatomegaly, no splenomegaly EXT:  2 plus pulses throughout, no edema, no cyanosis no clubbing    EKG:  EKG is not ordered today.    Recent Labs: No results found for requested labs within last 8760 hours.    Lipid Panel No results found for: CHOL, TRIG, HDL, CHOLHDL, VLDL, LDLCALC, LDLDIRECT    Wt Readings from Last 3 Encounters:  04/14/18 142 lb 12.8 oz (64.8 kg)  03/20/18 142 lb (64.4 kg)  11/20/17 149 lb (67.6 kg)      Other studies Reviewed: Additional studies/ records that were reviewed today include: Records from [facility] Review of the above records demonstrates:     ASSESSMENT AND PLAN:  SVT:  We discussed referral to EP to consider ablation.  He has now had second episode last 6 months.  He does not tolerate beta-blockers.  These have been significantly symptomatic episodes.  Therefore, we have discussed further ablation and he would like to investigate this.  In the meantime he is going to slightly limit his activities as walking in the hot sun might of been a trigger most recently.  I will have him hold his beta-blocker since he is having bradycardia and low blood pressures and is quite fatigued.  Current medicines are reviewed at length with the patient today.  The patient does not have concerns regarding medicines.  ELEVATED TROPONIN:   He had mildly elevated enzymes but no obstructive CAD in the past.    The following changes have been made:  None  Labs/ tests ordered today include: None  No orders of the defined types were placed in this encounter.    Disposition:   FU with me after EP.   Signed, Minus Breeding, MD  04/14/2018 10:03 AM    [facility]

## 2018-04-13 NOTE — Telephone Encounter (Signed)
I am not in clinic this afternoon.  They could see me at 8:40 in the morning if they want to move this up.

## 2018-04-13 NOTE — Telephone Encounter (Signed)
New Message   Pt's wife states that the pt was in [place] and was admitted into the hospital for 2 days for SVT and wa told to get an appt today. Please call Also states that records can be retrieved at [phone] which is the Cardiovascular Doctor they seen

## 2018-04-13 NOTE — Telephone Encounter (Signed)
Spoke with pt wife, he went into SVT and they had to give him a shot to get it to stop. Then y sent him home with a prescription for metoprolol succ 25 mg. He took it the first time this morning because his heart rate was 85 bpm. Currently his heart rate is 79. The patient feels fine this morning but they were told they need to see dr hochrein today. Follow up made for Wednesday this week but they would like to be worked in today. Will forward for dr hochrein to review and advise.

## 2018-04-13 NOTE — Telephone Encounter (Signed)
ECG's requested from the cardiology office in [facility].

## 2018-04-14 ENCOUNTER — Encounter: Payer: Self-pay | Admitting: Cardiology

## 2018-04-14 ENCOUNTER — Ambulatory Visit (INDEPENDENT_AMBULATORY_CARE_PROVIDER_SITE_OTHER): Payer: Medicare Other | Admitting: Cardiology

## 2018-04-14 VITALS — BP 111/67 | HR 62 | Ht 62.0 in | Wt 142.8 lb

## 2018-04-14 DIAGNOSIS — I471 Supraventricular tachycardia: Secondary | ICD-10-CM | POA: Diagnosis not present

## 2018-04-14 NOTE — Patient Instructions (Signed)
Medication Instructions:  Continue current medications  If you need a refill on your cardiac medications before your next appointment, please call your pharmacy.  Labwork: None Ordered   Testing/Procedures: None Ordered   Follow-Up: Your physician wants you to follow-up on Friday at 8:30 pm with Dr Lovena Le.      Thank you for choosing [facility] at [facility]!!

## 2018-04-15 ENCOUNTER — Ambulatory Visit: Payer: Medicare Other | Admitting: Cardiology

## 2018-04-17 ENCOUNTER — Encounter: Payer: Self-pay | Admitting: Internal Medicine

## 2018-04-17 ENCOUNTER — Ambulatory Visit (INDEPENDENT_AMBULATORY_CARE_PROVIDER_SITE_OTHER): Payer: Medicare Other | Admitting: Internal Medicine

## 2018-04-17 VITALS — BP 92/56 | HR 67 | Ht 62.0 in | Wt 144.0 lb

## 2018-04-17 DIAGNOSIS — I471 Supraventricular tachycardia: Secondary | ICD-10-CM | POA: Diagnosis not present

## 2018-04-17 NOTE — Progress Notes (Signed)
HPI [name] is referred today by Dr. [facility] for evaluation of SVT. He is a pleasant elderly man who has had 2 hospitalizations for SVT. Both have been converted with IV adenosine. His HR was in the 180 range. The patient had elevated cardiac enzymes. He was placed on beta blocker therapy and developed bradycardia. Cardiac cath demonstrated no obstructive CAD. The patient does not have much in the way of palpitations but does get sob and his chest is noted to shake when he goes into SVT.  Allergies  Allergen Reactions  . Dilantin [Phenytoin Sodium Extended]   . Lamotrigine Other (See Comments)    Drowsiness  . Sulfonamide Derivatives     As a child     Current Outpatient Medications  Medication Sig Dispense Refill  . acetaminophen (TYLENOL) 500 MG tablet Take 500 mg by mouth every 6 (six) hours as needed.    Marland Kitchen atorvastatin (LIPITOR) 40 MG tablet Take by mouth.    . donepezil (ARICEPT) 10 MG tablet Take 10 mg by mouth at bedtime.    . finasteride (PROSCAR) 5 MG tablet Take 1 tablet by mouth daily.  0  . Levetiracetam 750 MG TB24 Take 1 tablet (750 mg total) by mouth at bedtime. 90 each 4  . levothyroxine (SYNTHROID, LEVOTHROID) 100 MCG tablet Take 100 mcg by mouth daily before breakfast.    . metoprolol tartrate (LOPRESSOR) 25 MG tablet Take by mouth.    . Multiple Vitamin (MULTIVITAMIN) tablet Take 1 tablet by mouth daily.      Marland Kitchen omeprazole (PRILOSEC) 20 MG capsule Take 20 mg by mouth daily.      . Probiotic Product (ALIGN PO) Take by mouth.    . Pyridoxine HCl (VITAMIN B-6) 500 MG tablet Take 500 mg by mouth daily.      . vitamin B-12 (CYANOCOBALAMIN) 1000 MCG tablet Take 1,000 mcg by mouth daily.      . VOLTAREN 1 % GEL   0   No current facility-administered medications for this visit.      Past Medical History:  Diagnosis Date  . Allergic rhinitis   . Asthma   . Barrett syndrome   . Depressive disorder, not elsewhere classified   . Dizziness and giddiness   .  Esophageal reflux   . 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 Memory loss   . Other and unspecified hyperlipidemia   . Seizure disorder ([place])   . Unspecified hypothyroidism     ROS:   All systems reviewed and negative except as noted in the HPI.   Past Surgical History:  Procedure Laterality Date  . APPENDECTOMY    . right hip replacement  2005     Family History  Problem Relation Age of Onset  . Heart disease Mother   . Memory loss Mother   . Asthma Sister   . Parkinson's disease Sister      Social History   Socioeconomic History  . Marital status: Married    Spouse name: Eugenia  . Number of children: 2  . Years of education: BA  . Highest education level: Not on file  Occupational History  . 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 Financial resource strain: Not on file  . Food insecurity:    Worry: Not on file    Inability: Not on file  . Transportation needs:    Medical: Not  on file    Non-medical: Not on file  Tobacco Use  . Smoking status: Never Smoker  . Smokeless tobacco: Never Used  Substance and Sexual Activity  . Alcohol use: No    Alcohol/week: 0.0 oz  . Drug use: No  . Sexual activity: Not on file  Lifestyle  . Physical activity:    Days per week: Not on file    Minutes per session: Not on file  . Stress: Not on file  Relationships  . Social connections:    Talks on phone: Not on file    Gets together: Not on file    Attends religious service: Not on file    Active member of club or organization: Not on file    Attends meetings of clubs or organizations: Not on file    Relationship status: Not on file  . Intimate partner violence:    Fear of current or ex partner: Not on file    Emotionally abused: Not on file    Physically abused: Not on file    Forced sexual activity: Not on file  Other Topics Concern  . Not  on file  Social History Narrative   Patient lives at home with family. ([place])   Caffeine Use: up to 3 cups daily     BP (!) 92/56   Pulse 67   Ht 5\' 2"  (1.575 m)   Wt 144 lb (65.3 kg)   SpO2 97%   BMI 26.34 kg/m   Physical Exam:  Well appearing NAD HEENT: Unremarkable Neck:  No JVD, no thyromegally Lymphatics:  No adenopathy Back:  No CVA tenderness Lungs:  Clear with no wheezes HEART:  Regular rate rhythm, no murmurs, no rubs, no clicks Abd:  soft, positive bowel sounds, no organomegally, no rebound, no guarding Ext:  2 plus pulses, no edema, no cyanosis, no clubbing Skin:  No rashes no nodules Neuro:  CN II through XII intact, motor grossly intact  EKG - NSR with no pre-excitation.   Assess/Plan: 1. SVT - I have discussed the treatment options with the patient. The risks/benefits/goals/expectations of EP study and catheter ablation were reviewed. He would like to proceed.  2. Sinus node dysfunction - he developed bradycardia with beta blocker therapy.   Mikle Bosworth.D.

## 2018-04-17 NOTE — H&P (View-Only) (Signed)
HPI [name] is referred today by Dr. [facility] for evaluation of SVT. He is a pleasant elderly man who has had 2 hospitalizations for SVT. Both have been converted with IV adenosine. His HR was in the 180 range. The patient had elevated cardiac enzymes. He was placed on beta blocker therapy and developed bradycardia. Cardiac cath demonstrated no obstructive CAD. The patient does not have much in the way of palpitations but does get sob and his chest is noted to shake when he goes into SVT.  Allergies  Allergen Reactions  . Dilantin [Phenytoin Sodium Extended]   . Lamotrigine Other (See Comments)    Drowsiness  . Sulfonamide Derivatives     As a child     Current Outpatient Medications  Medication Sig Dispense Refill  . acetaminophen (TYLENOL) 500 MG tablet Take 500 mg by mouth every 6 (six) hours as needed.    Marland Kitchen atorvastatin (LIPITOR) 40 MG tablet Take by mouth.    . donepezil (ARICEPT) 10 MG tablet Take 10 mg by mouth at bedtime.    . finasteride (PROSCAR) 5 MG tablet Take 1 tablet by mouth daily.  0  . Levetiracetam 750 MG TB24 Take 1 tablet (750 mg total) by mouth at bedtime. 90 each 4  . levothyroxine (SYNTHROID, LEVOTHROID) 100 MCG tablet Take 100 mcg by mouth daily before breakfast.    . metoprolol tartrate (LOPRESSOR) 25 MG tablet Take by mouth.    . Multiple Vitamin (MULTIVITAMIN) tablet Take 1 tablet by mouth daily.      Marland Kitchen omeprazole (PRILOSEC) 20 MG capsule Take 20 mg by mouth daily.      . Probiotic Product (ALIGN PO) Take by mouth.    . Pyridoxine HCl (VITAMIN B-6) 500 MG tablet Take 500 mg by mouth daily.      . vitamin B-12 (CYANOCOBALAMIN) 1000 MCG tablet Take 1,000 mcg by mouth daily.      . VOLTAREN 1 % GEL   0   No current facility-administered medications for this visit.      Past Medical History:  Diagnosis Date  . Allergic rhinitis   . Asthma   . Barrett syndrome   . Depressive disorder, not elsewhere classified   . Dizziness and giddiness   .  Esophageal reflux   . 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 Memory loss   . Other and unspecified hyperlipidemia   . Seizure disorder ([place])   . Unspecified hypothyroidism     ROS:   All systems reviewed and negative except as noted in the HPI.   Past Surgical History:  Procedure Laterality Date  . APPENDECTOMY    . right hip replacement  2005     Family History  Problem Relation Age of Onset  . Heart disease Mother   . Memory loss Mother   . Asthma Sister   . Parkinson's disease Sister      Social History   Socioeconomic History  . Marital status: Married    Spouse name: Eugenia  . Number of children: 2  . Years of education: BA  . Highest education level: Not on file  Occupational History  . 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 Financial resource strain: Not on file  . Food insecurity:    Worry: Not on file    Inability: Not on file  . Transportation needs:    Medical: Not  on file    Non-medical: Not on file  Tobacco Use  . Smoking status: Never Smoker  . Smokeless tobacco: Never Used  Substance and Sexual Activity  . Alcohol use: No    Alcohol/week: 0.0 oz  . Drug use: No  . Sexual activity: Not on file  Lifestyle  . Physical activity:    Days per week: Not on file    Minutes per session: Not on file  . Stress: Not on file  Relationships  . Social connections:    Talks on phone: Not on file    Gets together: Not on file    Attends religious service: Not on file    Active member of club or organization: Not on file    Attends meetings of clubs or organizations: Not on file    Relationship status: Not on file  . Intimate partner violence:    Fear of current or ex partner: Not on file    Emotionally abused: Not on file    Physically abused: Not on file    Forced sexual activity: Not on file  Other Topics Concern  . Not  on file  Social History Narrative   Patient lives at home with family. ([place])   Caffeine Use: up to 3 cups daily     BP (!) 92/56   Pulse 67   Ht 5\' 2"  (1.575 m)   Wt 144 lb (65.3 kg)   SpO2 97%   BMI 26.34 kg/m   Physical Exam:  Well appearing NAD HEENT: Unremarkable Neck:  No JVD, no thyromegally Lymphatics:  No adenopathy Back:  No CVA tenderness Lungs:  Clear with no wheezes HEART:  Regular rate rhythm, no murmurs, no rubs, no clicks Abd:  soft, positive bowel sounds, no organomegally, no rebound, no guarding Ext:  2 plus pulses, no edema, no cyanosis, no clubbing Skin:  No rashes no nodules Neuro:  CN II through XII intact, motor grossly intact  EKG - NSR with no pre-excitation.   Assess/Plan: 1. SVT - I have discussed the treatment options with the patient. The risks/benefits/goals/expectations of EP study and catheter ablation were reviewed. He would like to proceed.  2. Sinus node dysfunction - he developed bradycardia with beta blocker therapy.   Mikle Bosworth.D.

## 2018-04-17 NOTE — Patient Instructions (Addendum)
Medication Instructions:  Your physician recommends that you continue on your current medications as directed. Please refer to the Current Medication list given to you today.  Labwork: NO additional lab work needed.  Testing/Procedures: Your physician has recommended that you have an ablation. Catheter ablation is a medical procedure used to treat some cardiac arrhythmias (irregular heartbeats). During catheter ablation, a long, thin, flexible tube is put into a blood vessel in your groin (upper thigh), or neck. This tube is called an ablation catheter. It is then guided to your heart through the blood vessel. Radio frequency waves destroy small areas of heart tissue where abnormal heartbeats may cause an arrhythmia to start. Please see the instruction sheet given to you today.  Follow-Up:  You will follow up with Dr. Lovena Le 4 weeks after your procedure.  ABLATION INSTRUCTIONS:  Please arrive at the [facility] main entrance of [facility] hospital at:  8:30 am on April 29, 2018  Do not eat or drink after midnight prior to procedure  You may take all your normal morning medications the day of your procedure. YOU WILL NOT take your METOPROLOL for 2 days prior to your procedure.  Your last dose will be July 7 your evening dose  Plan for one night stay-but you may be discharged.  You will need someone to drive you home at discharge  If you need a refill on your cardiac medications before your next appointment, please call your pharmacy.   Cardiac Ablation Cardiac ablation is a procedure to disable (ablate) a small amount of heart tissue in very specific places. The heart has many electrical connections. Sometimes these connections are abnormal and can cause the heart to beat very fast or irregularly. Ablating some of the problem areas can improve the heart rhythm or return it to normal. Ablation may be done for people who:  Have Wolff-Parkinson-White syndrome.  Have fast heart rhythms  (tachycardia).  Have taken medicines for an abnormal heart rhythm (arrhythmia) that were not effective or caused side effects.  Have a high-risk heartbeat that may be life-threatening.  During the procedure, a small incision is made in the neck or the groin, and a long, thin, flexible tube (catheter) is inserted into the incision and moved to the heart. Small devices (electrodes) on the tip of the catheter will send out electrical currents. A type of X-ray (fluoroscopy) will be used to help guide the catheter and to provide images of the heart. Tell a health care provider about:  Any allergies you have.  All medicines you are taking, including vitamins, herbs, eye drops, creams, and over-the-counter medicines.  Any problems you or family members have had with anesthetic medicines.  Any blood disorders you have.  Any surgeries you have had.  Any medical conditions you have, such as kidney failure.  Whether you are pregnant or may be pregnant. What are the risks? Generally, this is a safe procedure. However, problems may occur, including:  Infection.  Bruising and bleeding at the catheter insertion site.  Bleeding into the chest, especially into the sac that surrounds the heart. This is a serious complication.  Stroke or blood clots.  Damage to other structures or organs.  Allergic reaction to medicines or dyes.  Need for a permanent pacemaker if the normal electrical system is damaged. A pacemaker is a small computer that sends electrical signals to the heart and helps your heart beat normally.  The procedure not being fully effective. This may not be recognized until months  later. Repeat ablation procedures are sometimes required.  What happens before the procedure?  Follow instructions from your health care provider about eating or drinking restrictions.  Ask your health care provider about: ? Changing or stopping your regular medicines. This is especially important if  you are taking diabetes medicines or blood thinners. ? Taking medicines such as aspirin and ibuprofen. These medicines can thin your blood. Do not take these medicines before your procedure if your health care provider instructs you not to.  Plan to have someone take you home from the hospital or clinic.  If you will be going home right after the procedure, plan to have someone with you for 24 hours. What happens during the procedure?  To lower your risk of infection: ? Your health care team will wash or sanitize their hands. ? Your skin will be washed with soap. ? Hair may be removed from the incision area.  An IV tube will be inserted into one of your veins.  You will be given a medicine to help you relax (sedative).  The skin on your neck or groin will be numbed.  An incision will be made in your neck or your groin.  A needle will be inserted through the incision and into a large vein in your neck or groin.  A catheter will be inserted into the needle and moved to your heart.  Dye may be injected through the catheter to help your surgeon see the area of the heart that needs treatment.  Electrical currents will be sent from the catheter to ablate heart tissue in desired areas. There are three types of energy that may be used to ablate heart tissue: ? Heat (radiofrequency energy). ? Laser energy. ? Extreme cold (cryoablation).  When the necessary tissue has been ablated, the catheter will be removed.  Pressure will be held on the catheter insertion area to prevent excessive bleeding.  A bandage (dressing) will be placed over the catheter insertion area. The procedure may vary among health care providers and hospitals. What happens after the procedure?  Your blood pressure, heart rate, breathing rate, and blood oxygen level will be monitored until the medicines you were given have worn off.  Your catheter insertion area will be monitored for bleeding. You will need to lie  still for a few hours to ensure that you do not bleed from the catheter insertion area.  Do not drive for 24 hours or as long as directed by your health care provider. Summary  Cardiac ablation is a procedure to disable (ablate) a small amount of heart tissue in very specific places. Ablating some of the problem areas can improve the heart rhythm or return it to normal.  During the procedure, electrical currents will be sent from the catheter to ablate heart tissue in desired areas. This information is not intended to replace advice given to you by your health care provider. Make sure you discuss any questions you have with your health care provider. Document Released: 02/23/2009 Document Revised: 08/26/2016 Document Reviewed: 08/26/2016 Elsevier Interactive Patient Education  [employer].

## 2018-04-20 ENCOUNTER — Encounter (HOSPITAL_COMMUNITY): Payer: Self-pay | Admitting: Emergency Medicine

## 2018-04-20 ENCOUNTER — Emergency Department (HOSPITAL_COMMUNITY)
Admission: EM | Admit: 2018-04-20 | Discharge: 2018-04-21 | Disposition: A | Discharge status: Home / Self Care | Payer: Medicare Other | Attending: Emergency Medicine | Admitting: Emergency Medicine

## 2018-04-20 ENCOUNTER — Other Ambulatory Visit: Payer: Self-pay

## 2018-04-20 ENCOUNTER — Emergency Department (HOSPITAL_COMMUNITY): Payer: Medicare Other

## 2018-04-20 DIAGNOSIS — Z79899 Other long term (current) drug therapy: Secondary | ICD-10-CM | POA: Insufficient documentation

## 2018-04-20 DIAGNOSIS — E039 Hypothyroidism, unspecified: Secondary | ICD-10-CM | POA: Diagnosis not present

## 2018-04-20 DIAGNOSIS — R000 Tachycardia, unspecified: Secondary | ICD-10-CM | POA: Diagnosis present

## 2018-04-20 DIAGNOSIS — J45909 Unspecified asthma, uncomplicated: Secondary | ICD-10-CM | POA: Insufficient documentation

## 2018-04-20 DIAGNOSIS — Z96641 Presence of right artificial hip joint: Secondary | ICD-10-CM | POA: Insufficient documentation

## 2018-04-20 DIAGNOSIS — I471 Supraventricular tachycardia: Secondary | ICD-10-CM | POA: Diagnosis not present

## 2018-04-20 LAB — CBC
HCT: 39.4 % (ref 39.0–52.0)
Hemoglobin: 12.5 g/dL — ABNORMAL LOW (ref 13.0–17.0)
MCH: 30.5 pg (ref 26.0–34.0)
MCHC: 31.7 g/dL (ref 30.0–36.0)
MCV: 96.1 fL (ref 78.0–100.0)
PLATELETS: 129 10*3/uL — AB (ref 150–400)
RBC: 4.1 MIL/uL — ABNORMAL LOW (ref 4.22–5.81)
RDW: 14.2 % (ref 11.5–15.5)
WBC: 3.9 10*3/uL — ABNORMAL LOW (ref 4.0–10.5)

## 2018-04-20 LAB — BASIC METABOLIC PANEL
ANION GAP: 7 (ref 5–15)
BUN: 9 mg/dL (ref 8–23)
CHLORIDE: 104 mmol/L (ref 98–111)
CO2: 27 mmol/L (ref 22–32)
Calcium: 8.6 mg/dL — ABNORMAL LOW (ref 8.9–10.3)
Creatinine, Ser: 0.76 mg/dL (ref 0.61–1.24)
GFR calc Af Amer: >60 mL/min (ref >60)
GLUCOSE: 118 mg/dL — AB (ref 70–99)
POTASSIUM: 4.1 mmol/L (ref 3.5–5.1)
Sodium: 138 mmol/L (ref 135–145)

## 2018-04-20 LAB — I-STAT TROPONIN, ED: TROPONIN I, POC: 0.06 ng/mL (ref 0.00–0.08)

## 2018-04-20 NOTE — ED Provider Notes (Signed)
[facility] EMERGENCY DEPARTMENT Provider Note   CSN: [id number] Arrival date & time: 04/20/18  2135     History   Chief Complaint Chief Complaint  Patient presents with  . Tachycardia    HPI [name] is a 84 y.o. male.  HPI Patient has history of SVT.  He has wife are returning to the room at their assisted living after eating.  He was bent over trying to use the key card further door.  Started to get lightheaded.  They went inside and measured his heart rate and it was 170.  He denies he had chest pain.  EMS was called.  He was given adenosine for SVT.  Symptoms resolved.  Patient has had SVT and is scheduled for an ablation in approximately 1/2 weeks. Past Medical History:  Diagnosis Date  . Allergic rhinitis   . Asthma   . Barrett syndrome   . Depressive disorder, not elsewhere classified   . Dizziness and giddiness   . Esophageal reflux   . 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 Memory loss   . Other and unspecified hyperlipidemia   . Seizure disorder ([place])   . Unspecified hypothyroidism     Patient Active Problem List   Diagnosis Date Noted  . Pain in right ankle and joints of right foot 03/03/2017  . Acute left-sided low back pain without sciatica 03/03/2017  . Post-traumatic osteoarthritis, right ankle and foot 03/03/2017  . ACUTE BRONCHITIS 09/05/2009  . ALLERGIC RHINITIS 09/26/2007  . Asthma with bronchitis 09/26/2007  . ESOPHAGEAL REFLUX 09/26/2007  . SEIZURE DISORDER 09/26/2007    Past Surgical History:  Procedure Laterality Date  . APPENDECTOMY    . right hip replacement  2005        Home Medications    Prior to Admission medications   Medication Sig Start Date End Date Taking? Authorizing Provider  acetaminophen (TYLENOL) 500 MG tablet Take 1,000 mg by mouth every 6 (six) hours as needed for mild pain or headache.    Yes  [provider]  atorvastatin (LIPITOR) 40 MG tablet Take 40 mg by mouth at bedtime.   Yes [provider]  cromolyn (NASALCROM) 5.2 MG/ACT nasal spray Place 1 spray into both nostrils 2 (two) times daily.   Yes [provider]  Cyanocobalamin (VITAMIN B-12 SL) Place 1 tablet under the tongue daily with breakfast.   Yes [provider]  donepezil (ARICEPT) 10 MG tablet Take 10 mg by mouth at bedtime.   Yes [provider]  donepezil (ARICEPT) 23 MG TABS tablet Take 23 mg by mouth daily with breakfast.    Yes [provider]  finasteride (PROSCAR) 5 MG tablet Take 5 mg by mouth daily.  10/07/14  Yes [provider]  fluticasone (FLONASE) 50 MCG/ACT nasal spray Place 1-2 sprays into both nostrils daily as needed for allergies or rhinitis.   Yes [provider]  Levetiracetam 750 MG TB24 Take 1 tablet (750 mg total) by mouth at bedtime. 07/08/17  Yes Penumalli, Earlean Polka, MD  levothyroxine (SYNTHROID, LEVOTHROID) 75 MCG tablet Take 75 mcg by mouth daily before breakfast.   Yes [provider]  Loperamide-Simethicone (IMODIUM MULTI-SYMPTOM RELIEF) 2-125 MG TABS Take 1-2 tablets by mouth every 6 (six) hours as needed (for symptoms).   Yes [provider]  memantine (NAMENDA) 5 MG tablet Take 10 mg by mouth 2 (two) times daily.  Yes [provider]  metoprolol tartrate (LOPRESSOR) 25 MG tablet Take 25 mg by mouth See admin instructions. Take 25 mg by mouth as directed only if HR is greater than 100   Yes [provider]  Multiple Vitamin (MULTIVITAMIN) tablet Take 1 tablet by mouth daily.     Yes [provider]  omeprazole (PRILOSEC) 20 MG capsule Take 20 mg by mouth at bedtime.    Yes [provider]  traMADol (ULTRAM) 50 MG tablet Take 50 mg by mouth every 6 (six) hours as needed (for pain).   Yes [provider]  VOLTAREN 1 % GEL Apply 2-4 g topically See admin  instructions. Apply 2-4 grams to the right ankle 2 times a day- morning and bedtime 02/21/15  Yes [provider]  Probiotic Product (ALIGN PO) Take 1 tablet by mouth daily.     [provider]    Family History Family History  Problem Relation Age of Onset  . Heart disease Mother   . Memory loss Mother   . Asthma Sister   . Parkinson's disease Sister     Social History Social History   Tobacco Use  . Smoking status: Never Smoker  . Smokeless tobacco: Never Used  Substance Use Topics  . Alcohol use: No    Alcohol/week: 0.0 oz  . Drug use: No     Allergies   Dilantin [phenytoin sodium extended]; Lamotrigine; and Sulfonamide derivatives   Review of Systems Review of Systems 10 Systems reviewed and are negative for acute change except as noted in the HPI.   Physical Exam Updated Vital Signs BP 105/77   Pulse (!) 58   Temp 98 F (36.7 C) (Oral)   Resp 13   Ht 5' 4.5" (1.638 m)   Wt 70.3 kg (155 lb)   SpO2 93%   BMI 26.19 kg/m   Physical Exam Physical exam section will not load in epic. Patient is alert and nontoxic. Head: Normocephalic atraumatic Cardiovascular: Regular no gross rub murmur gallop.  Peripheral pulses 2+. Lungs: Clear to auscultation Abdomen: Soft nontender no guarding Remedies: No peripheral edema or calf tenderness Logic: Alert and oriented x3.  Movements coordinated purposeful symmetric Skin: Warm and dry ED Treatments / Results  Labs (all labs ordered are listed, but only abnormal results are displayed) Labs Reviewed  BASIC METABOLIC PANEL - Abnormal; Notable for the following components:      Result Value   Glucose, Bld 118 (*)    Calcium 8.6 (*)    All other components within normal limits  CBC - Abnormal; Notable for the following components:   WBC 3.9 (*)    RBC 4.10 (*)    Hemoglobin 12.5 (*)    Platelets 129 (*)    All other components within normal limits  I-STAT TROPONIN, ED    EKG EKG  Interpretation  Date/Time:  Monday April 20 2018 23:34:33 EDT Ventricular Rate:  53 PR Interval:    QRS Duration: 105 QT Interval:  436 QTC Calculation: 410 R Axis:   -68 Text Interpretation:  Sinus rhythm Markedly posterior QRS axis Low voltage, precordial leads Borderline T abnormalities, lateral leads agree. similar to old but lower voltage Confirmed by Charlesetta Shanks [phone]) on 04/20/2018 11:38:35 PM   Radiology Dg Chest 2 View  Result Date: 04/20/2018 CLINICAL DATA:  Tachycardia. EXAM: CHEST - 2 VIEW COMPARISON:  01/13/2013 FINDINGS: The heart size and mediastinal contours are within normal limits. Both lungs are clear. The visualized skeletal structures  are unremarkable. IMPRESSION: No active cardiopulmonary disease. Electronically Signed   By: Margarette Canada M.D.   On: 04/20/2018 22:20    Procedures Procedures (including critical care time)  Medications Ordered in ED Medications - No data to display   Initial Impression / Assessment and Plan / ED Course  I have reviewed the triage vital signs and the nursing notes.  Pertinent labs & imaging results that were available during my care of the patient were reviewed by me and considered in my medical decision making (see chart for details).    Consult: Reviewed with Dr. Glenford Peers cardiology.  At this time, patient has known history of SVT and has plan for ablation.  Is now symptomatic.  He will message the provider to try to facilitate expedited follow-up.  Final Clinical Impressions(s) / ED Diagnoses   Final diagnoses:  SVT (supraventricular tachycardia) ([place])  Patient is asymptomatic.  Symptoms have resolved after treatment.  Patient feels comfortable returning home and contacting his cardiologist in the morning.  Return precautions reviewed.  ED Discharge Orders    None       Charlesetta Shanks, MD 04/21/18 [phone]

## 2018-04-20 NOTE — ED Triage Notes (Signed)
Pt arriving via [facility] from Assisted living facility (EMS and pt unable to tell this RN which one).  Pt took his metoprolol around 1940. Pt started feeling dizzy and facility found his HR to be 180. When EMS arrived he was in SVT.  Vagal maneuvers were unsuccessful so EMS gave 18 mg of Adenosine.  Pt converted and HR remained in the 60's after.  VS per EMS 105/65, 96% RA, P 60, CBG 123. EMS gave 500 ml bolus.  Pt has no complaints but feeling groggy.

## 2018-04-20 NOTE — ED Notes (Signed)
Patient transported to X-ray 

## 2018-04-21 ENCOUNTER — Telehealth: Payer: Self-pay

## 2018-04-21 ENCOUNTER — Telehealth: Payer: Self-pay | Admitting: Cardiology

## 2018-04-21 NOTE — Telephone Encounter (Signed)
Call received from Pt's wife.  Pt rehospitalized for SVT.  Per wife Pt has not been taking metoprolol (d/t instructions to hold metoprolol for 2 days prior to ablation)  Instructed wife to have Pt take metoprolol every day except for 2 days prior to ablation.  Wife indicates understanding.  Requesting procedure be moved sooner.   Moved ablation from July 10 to July 8 at 12:00 pm per request.  Reminded to have Pt hold metoprolol on Saturday and Sunday.  Wife indicates understanding.

## 2018-04-21 NOTE — Telephone Encounter (Signed)
Routed to [facility] MD and [facility] CMA as FYI - also in [facility]

## 2018-04-21 NOTE — Telephone Encounter (Signed)
New Message   Patients wife is calling because she wants Dr. Percival Spanish to know that her spouse was at the emergency room last night. She advises that he had to go to [facility] in [place]. Records can be obtained from them by calling [phone].

## 2018-04-21 NOTE — Discharge Instructions (Addendum)
1.  Call your cardiologist's office in the morning to schedule a recheck as soon as possible.

## 2018-04-22 NOTE — Telephone Encounter (Signed)
Can someone contact the hospital in [facility] to see if we can get H&P & D/C summary (any cardiac tests).  Glenetta Hew, MD

## 2018-04-24 ENCOUNTER — Telehealth: Payer: Self-pay

## 2018-04-24 NOTE — Telephone Encounter (Signed)
Call received from wife.  Wife asking if Pt should have HR in the 170's should they go back to ER.  Advised if Pt highly symptomatic (sob, chest pain) they should go to ER.  If Pt minimally symptomatic, dizzy, have Pt sit down and rest.  Pt may use metoprolol if needed to avoid ER.  Wife indicates understanding.  Pt scheduled for SVT ablation on Monday.

## 2018-04-24 NOTE — Telephone Encounter (Signed)
Staff message sent to medical records to obtain

## 2018-04-27 ENCOUNTER — Ambulatory Visit (HOSPITAL_COMMUNITY)
Admission: RE | Admit: 2018-04-27 | Discharge: 2018-04-28 | Disposition: A | Discharge status: Home / Self Care | Payer: Medicare Other | Source: Ambulatory Visit | Attending: Internal Medicine | Admitting: Internal Medicine

## 2018-04-27 ENCOUNTER — Other Ambulatory Visit: Payer: Self-pay

## 2018-04-27 ENCOUNTER — Encounter (HOSPITAL_COMMUNITY)
Admission: RE | Disposition: A | Discharge status: Home / Self Care | Payer: Self-pay | Source: Ambulatory Visit | Attending: Internal Medicine

## 2018-04-27 DIAGNOSIS — Z9889 Other specified postprocedural states: Secondary | ICD-10-CM | POA: Insufficient documentation

## 2018-04-27 DIAGNOSIS — K219 Gastro-esophageal reflux disease without esophagitis: Secondary | ICD-10-CM | POA: Diagnosis not present

## 2018-04-27 DIAGNOSIS — E785 Hyperlipidemia, unspecified: Secondary | ICD-10-CM | POA: Diagnosis not present

## 2018-04-27 DIAGNOSIS — Z96641 Presence of right artificial hip joint: Secondary | ICD-10-CM | POA: Insufficient documentation

## 2018-04-27 DIAGNOSIS — I471 Supraventricular tachycardia, unspecified: Secondary | ICD-10-CM | POA: Diagnosis not present

## 2018-04-27 DIAGNOSIS — Z882 Allergy status to sulfonamides status: Secondary | ICD-10-CM | POA: Diagnosis not present

## 2018-04-27 DIAGNOSIS — J45909 Unspecified asthma, uncomplicated: Secondary | ICD-10-CM | POA: Insufficient documentation

## 2018-04-27 DIAGNOSIS — G40909 Epilepsy, unspecified, not intractable, without status epilepticus: Secondary | ICD-10-CM | POA: Diagnosis not present

## 2018-04-27 DIAGNOSIS — K2270 Barrett's esophagus without dysplasia: Secondary | ICD-10-CM | POA: Insufficient documentation

## 2018-04-27 DIAGNOSIS — Z888 Allergy status to other drugs, medicaments and biological substances status: Secondary | ICD-10-CM | POA: Insufficient documentation

## 2018-04-27 DIAGNOSIS — E039 Hypothyroidism, unspecified: Secondary | ICD-10-CM | POA: Insufficient documentation

## 2018-04-27 DIAGNOSIS — Z79899 Other long term (current) drug therapy: Secondary | ICD-10-CM | POA: Insufficient documentation

## 2018-04-27 DIAGNOSIS — Z8249 Family history of ischemic heart disease and other diseases of the circulatory system: Secondary | ICD-10-CM | POA: Insufficient documentation

## 2018-04-27 HISTORY — DX: Supraventricular tachycardia: I47.1

## 2018-04-27 HISTORY — DX: Supraventricular tachycardia, unspecified: I47.10

## 2018-04-27 HISTORY — PX: SVT ABLATION: EP1225

## 2018-04-27 LAB — GLUCOSE, CAPILLARY: Glucose-Capillary: 78 mg/dL (ref 70–99)

## 2018-04-27 SURGERY — SVT ABLATION

## 2018-04-27 MED ORDER — ACETAMINOPHEN 500 MG PO TABS: 1000.0000 mg | ORAL_TABLET | Freq: Four times a day (QID) | ORAL | Status: DC | PRN

## 2018-04-27 MED ORDER — FENTANYL CITRATE (PF) 100 MCG/2ML IJ SOLN
INTRAMUSCULAR | Status: DC | PRN
  Administered 2018-04-27 (×6): 12.5 ug via INTRAVENOUS

## 2018-04-27 MED ORDER — DONEPEZIL HCL 23 MG PO TABS
23.0000 mg | ORAL_TABLET | Freq: Every day | ORAL | Status: DC
  Administered 2018-04-28: 23 mg via ORAL
  Filled 2018-04-27: qty 1

## 2018-04-27 MED ORDER — SODIUM CHLORIDE 0.9% FLUSH
3.0000 mL | Freq: Two times a day (BID) | INTRAVENOUS | Status: DC
  Administered 2018-04-27: 3 mL via INTRAVENOUS

## 2018-04-27 MED ORDER — SODIUM CHLORIDE 0.9% FLUSH: 3.0000 mL | INTRAVENOUS | Status: DC | PRN

## 2018-04-27 MED ORDER — LEVETIRACETAM ER 750 MG PO TB24
750.0000 mg | ORAL_TABLET | Freq: Every day | ORAL | Status: DC
  Administered 2018-04-27: 750 mg via ORAL
  Filled 2018-04-27 (×2): qty 1

## 2018-04-27 MED ORDER — LOPERAMIDE HCL 2 MG PO CAPS
2.0000 mg | ORAL_CAPSULE | Freq: Four times a day (QID) | ORAL | Status: DC | PRN
Start: 2018-04-27 — End: 2018-04-28

## 2018-04-27 MED ORDER — BUPIVACAINE HCL (PF) 0.25 % IJ SOLN
INTRAMUSCULAR | Status: AC
  Filled 2018-04-27: qty 60

## 2018-04-27 MED ORDER — TRAMADOL HCL 50 MG PO TABS: 50.0000 mg | ORAL_TABLET | Freq: Four times a day (QID) | ORAL | Status: DC | PRN

## 2018-04-27 MED ORDER — LOPERAMIDE-SIMETHICONE 2-125 MG PO TABS: 1.0000 | ORAL_TABLET | Freq: Four times a day (QID) | ORAL | Status: DC | PRN

## 2018-04-27 MED ORDER — FENTANYL CITRATE (PF) 100 MCG/2ML IJ SOLN
INTRAMUSCULAR | Status: AC
  Filled 2018-04-27: qty 2

## 2018-04-27 MED ORDER — MIDAZOLAM HCL 5 MG/5ML IJ SOLN
INTRAMUSCULAR | Status: AC
  Filled 2018-04-27: qty 5

## 2018-04-27 MED ORDER — SODIUM CHLORIDE 0.9 % IV SOLN: 250.0000 mL | INTRAVENOUS | Status: DC | PRN

## 2018-04-27 MED ORDER — ONDANSETRON HCL 4 MG/2ML IJ SOLN: 4.0000 mg | Freq: Four times a day (QID) | INTRAMUSCULAR | Status: DC | PRN

## 2018-04-27 MED ORDER — HEPARIN SODIUM (PORCINE) 1000 UNIT/ML IJ SOLN
INTRAMUSCULAR | Status: DC | PRN
  Administered 2018-04-27: 6000 [IU] via INTRAVENOUS

## 2018-04-27 MED ORDER — BUPIVACAINE HCL (PF) 0.25 % IJ SOLN
INTRAMUSCULAR | Status: DC | PRN
  Administered 2018-04-27: 45 mL

## 2018-04-27 MED ORDER — LEVOTHYROXINE SODIUM 75 MCG PO TABS
75.0000 ug | ORAL_TABLET | Freq: Every day | ORAL | Status: DC
  Administered 2018-04-28: 75 ug via ORAL
  Filled 2018-04-27: qty 1

## 2018-04-27 MED ORDER — ACETAMINOPHEN 325 MG PO TABS: 650.0000 mg | ORAL_TABLET | ORAL | Status: DC | PRN

## 2018-04-27 MED ORDER — FINASTERIDE 5 MG PO TABS
5.0000 mg | ORAL_TABLET | Freq: Every day | ORAL | Status: DC
  Administered 2018-04-27 – 2018-04-28 (×2): 5 mg via ORAL
  Filled 2018-04-27 (×2): qty 1

## 2018-04-27 MED ORDER — MIDAZOLAM HCL 5 MG/5ML IJ SOLN
INTRAMUSCULAR | Status: DC | PRN
  Administered 2018-04-27 (×6): 1 mg via INTRAVENOUS

## 2018-04-27 MED ORDER — METOPROLOL TARTRATE 25 MG PO TABS: 25.0000 mg | ORAL_TABLET | Freq: Four times a day (QID) | ORAL | Status: DC | PRN

## 2018-04-27 MED ORDER — SALINE SPRAY 0.65 % NA SOLN
1.0000 | Freq: Two times a day (BID) | NASAL | Status: DC | PRN
  Filled 2018-04-27: qty 44

## 2018-04-27 MED ORDER — HEPARIN SODIUM (PORCINE) 1000 UNIT/ML IJ SOLN
INTRAMUSCULAR | Status: AC
  Filled 2018-04-27: qty 1

## 2018-04-27 MED ORDER — ADULT MULTIVITAMIN W/MINERALS CH
1.0000 | ORAL_TABLET | Freq: Every day | ORAL | Status: DC
  Administered 2018-04-27 – 2018-04-28 (×2): 1 via ORAL
  Filled 2018-04-27 (×5): qty 1

## 2018-04-27 MED ORDER — SODIUM CHLORIDE 0.9 % IV SOLN
INTRAVENOUS | Status: DC
  Administered 2018-04-27: 11:00:00 via INTRAVENOUS

## 2018-04-27 MED ORDER — MEMANTINE HCL 5 MG PO TABS
10.0000 mg | ORAL_TABLET | Freq: Two times a day (BID) | ORAL | Status: DC
  Administered 2018-04-27 – 2018-04-28 (×2): 10 mg via ORAL
  Filled 2018-04-27 (×2): qty 2

## 2018-04-27 MED ORDER — SIMETHICONE 80 MG PO CHEW: 160.0000 mg | CHEWABLE_TABLET | Freq: Four times a day (QID) | ORAL | Status: DC | PRN

## 2018-04-27 MED ORDER — HEPARIN (PORCINE) IN NACL 1000-0.9 UT/500ML-% IV SOLN
INTRAVENOUS | Status: DC | PRN
  Administered 2018-04-27 (×2): 500 mL

## 2018-04-27 MED ORDER — FLUTICASONE PROPIONATE 50 MCG/ACT NA SUSP
1.0000 | Freq: Every day | NASAL | Status: DC | PRN
  Filled 2018-04-27: qty 16

## 2018-04-27 MED ORDER — DONEPEZIL HCL 10 MG PO TABS
10.0000 mg | ORAL_TABLET | Freq: Every day | ORAL | Status: DC
  Administered 2018-04-27: 10 mg via ORAL
  Filled 2018-04-27: qty 1

## 2018-04-27 MED ORDER — CROMOLYN SODIUM 5.2 MG/ACT NA AERS
1.0000 | INHALATION_SPRAY | Freq: Two times a day (BID) | NASAL | Status: DC
  Filled 2018-04-27: qty 26

## 2018-04-27 SURGICAL SUPPLY — 13 items
BAG SNAP BAND KOVER 36X36 (MISCELLANEOUS) ×2 IMPLANT
CATH CELSIUS THERM D CV 7F (ABLATOR) ×2 IMPLANT
CATH HEXAPOLAR DAMATO 6F (CATHETERS) ×2 IMPLANT
CATH JOSEPHSON QUAD-ALLRED 6FR (CATHETERS) ×4 IMPLANT
PACK EP LATEX FREE (CUSTOM PROCEDURE TRAY) ×1
PACK EP LF (CUSTOM PROCEDURE TRAY) ×1 IMPLANT
PAD DEFIB LIFELINK (PAD) ×2 IMPLANT
SHEATH PINNACLE 6F 10CM (SHEATH) ×4 IMPLANT
SHEATH PINNACLE 7F 10CM (SHEATH) ×2 IMPLANT
SHEATH PINNACLE 8F 10CM (SHEATH) ×4 IMPLANT
SHEATH SET SUPER ARROWFLEX 9FR (SHEATH) ×2 IMPLANT
SHIELD RADPAD SCOOP 12X17 (MISCELLANEOUS) ×2 IMPLANT
WIRE HI TORQ VERSACORE-J 145CM (WIRE) ×2 IMPLANT

## 2018-04-27 NOTE — Discharge Summary (Addendum)
ELECTROPHYSIOLOGY PROCEDURE DISCHARGE SUMMARY    Patient ID: [name],  MRN: [id number], DOB/AGE: [birth year] 84 y.o.  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  GERD 2.  Seizure disorder  Allergies  Allergen Reactions  . Dilantin [Phenytoin Sodium Extended] Other (See Comments)    Reaction not recalled by the patient (??)  . Lamotrigine Other (See Comments)    Drowsiness  . Sulfonamide Derivatives Other (See Comments)    From childhood; reaction not recalled     Procedures This Admission: 1.  Electrophysiology study and radiofrequency catheter ablation on 04/27/18 by Dr Lovena Le.  See op note for full details.  There were no inducible arrhythmias following ablation and no early apparent complications.   Brief HPI: [name] is a 84 y.o. male with a past medical history as outlined above.  He has had increasing tachypalpitations with documented SVT.  They have failed medical therapy with BB.  Risks, benefits, and alternatives to ablation were reviewed with the patient who wished to proceed.   Hospital Course:  The patient was admitted and underwent EPS/RFCA with details as outlined above. He was monitored on telemetry overnight which demonstrated SR.  Groin and neck incisions were without complication.  They were examined and  Considered stable for discharge to home.  Follow up will be arranged in 4 weeks.  Wound care and restrictions were reviewed with the patient prior to discharge.   Physical Exam: Vitals:   04/27/18 1610 04/27/18 2008 04/27/18 2315 04/28/18 0528  BP: 120/68 110/66 100/74 92/65  Pulse:  66    Resp:      Temp: 97.6 F (36.4 C) 97.7 F (36.5 C)  97.6 F (36.4 C)  TempSrc: Oral Oral  Oral  SpO2:  97%  96% 93%  Weight: 136 lb 7.4 oz (61.9 kg)   139 lb 5.3 oz (63.2 kg)  Height: 5' 8.5" (1.74 m)       GEN- The patient is elderly appearing, alert and oriented x 3 today.   HEENT: normocephalic, atraumatic; sclera clear, conjunctiva pink; hearing intact; oropharynx clear; neck supple  Lungs- Clear to ausculation bilaterally, normal work of breathing.  No wheezes, rales, rhonchi Heart- Regular rate and rhythm  GI- soft, non-tender, non-distended, bowel sounds present  Extremities- no clubbing, cyanosis, or edema; DP/PT/radial pulses 2+ bilaterally, groin without hematoma/bruit MS- no significant deformity or atrophy Skin- warm and dry, no rash or lesion Psych- euthymic mood, full affect Neuro- strength and sensation are intact   Discharge Vitals: Blood pressure 92/65, pulse 66, temperature 97.6 F (36.4 C), temperature source Oral, resp. rate (!) 21, height 5' 8.5" (1.74 m), weight 139 lb 5.3 oz (63.2 kg), SpO2 93 %.   Labs:   Lab Results  Component Value Date   WBC 3.9 (L) 04/20/2018   HGB 12.5 (L) 04/20/2018   HCT 39.4 04/20/2018   MCV 96.1 04/20/2018   PLT 129 (L) 04/20/2018    No results for input(s): NA, K, CL, CO2, BUN, CREATININE, CALCIUM, PROT, BILITOT, ALKPHOS, ALT, AST, GLUCOSE in the last 168 hours.  Invalid input(s): LABALBU  Discharge Medications:  Allergies as of 04/28/2018      Reactions   Dilantin [phenytoin Sodium Extended] Other (See Comments)   Reaction not recalled by the patient (??)  Lamotrigine Other (See Comments)   Drowsiness   Sulfonamide Derivatives Other (See Comments)   From childhood; reaction not recalled      Medication List    TAKE these medications   acetaminophen 500 MG tablet Commonly known as:  TYLENOL Take 1,000 mg by mouth every 6 (six) hours as needed for mild pain or headache.   ALIGN PO Take 1 tablet by mouth daily.   atorvastatin 40 MG tablet Commonly known as:  LIPITOR Take 40 mg by mouth at bedtime.   cromolyn 5.2  MG/ACT nasal spray Commonly known as:  NASALCROM Place 1 spray into both nostrils 2 (two) times daily.   donepezil 23 MG Tabs tablet Commonly known as:  ARICEPT Take 23 mg by mouth daily with breakfast.   donepezil 10 MG tablet Commonly known as:  ARICEPT Take 10 mg by mouth at bedtime.   finasteride 5 MG tablet Commonly known as:  PROSCAR Take 5 mg by mouth daily.   fluticasone 50 MCG/ACT nasal spray Commonly known as:  FLONASE Place 1-2 sprays into both nostrils daily as needed for allergies or rhinitis.   IMODIUM MULTI-SYMPTOM RELIEF 2-125 MG Tabs Generic drug:  Loperamide-Simethicone Take 1-2 tablets by mouth every 6 (six) hours as needed (for symptoms).   Levetiracetam 750 MG Tb24 Take 1 tablet (750 mg total) by mouth at bedtime.   levothyroxine 75 MCG tablet Commonly known as:  SYNTHROID, LEVOTHROID Take 75 mcg by mouth daily before breakfast.   memantine 5 MG tablet Commonly known as:  NAMENDA Take 10 mg by mouth 2 (two) times daily.   metoprolol tartrate 25 MG tablet Commonly known as:  LOPRESSOR Take 25 mg by mouth See admin instructions. Take 25 mg by mouth as directed only if HR is greater than 100   multivitamin tablet Take 1 tablet by mouth daily.   omeprazole 20 MG capsule Commonly known as:  PRILOSEC Take 20 mg by mouth at bedtime.   traMADol 50 MG tablet Commonly known as:  ULTRAM Take 50 mg by mouth every 6 (six) hours as needed (for pain).   VITAMIN B-12 SL Place 1 tablet under the tongue daily with breakfast.   VOLTAREN 1 % Gel Generic drug:  diclofenac sodium Apply 2-4 g topically See admin instructions. Apply 2-4 grams to the right ankle 2 times a day- morning and bedtime       Disposition:  Discharge Instructions    Diet - low sodium heart healthy   Complete by:  As directed    Increase activity slowly   Complete by:  As directed      Follow-up Information    Evans Lance, MD Follow up on 06/05/2018.   Specialty:   Cardiology Why:  AT 10:15AM Contact information: [address] [address] [phone]           Duration of Discharge Encounter: Greater than 30 minutes including physician time.  Signed, Chanetta Marshall, NP 04/28/2018 7:16 AM  EP Attending  Patient seen and examined. Agree with above. The patient is doing well today. Tele demonstrates no SVT. No heart block. His groin is good. He will be discharged home with usual followup.  Mikle Bosworth.D.

## 2018-04-27 NOTE — Discharge Instructions (Signed)
No driving for 4 days. No lifting over 5 lbs for 1 week. No sexual activity for 1 week. You may return to work in 1 week. Keep procedure site clean & dry. If you notice increased pain, swelling, bleeding or pus, call/return!  You may shower, but no soaking baths/hot tubs/pools for 1 week.  ° ° °

## 2018-04-27 NOTE — Interval H&P Note (Signed)
History and Physical Interval Note:  04/27/2018 11:43 AM  [name]  has presented today for surgery, with the diagnosis of svt  The various methods of treatment have been discussed with the patient and family. After consideration of risks, benefits and other options for treatment, the patient has consented to  Procedure(s): SVT ABLATION (N/A) as a surgical intervention .  The patient's history has been reviewed, patient examined, no change in status, stable for surgery.  I have reviewed the patient's chart and labs.  Questions were answered to the patient's satisfaction.     Cristopher Peru

## 2018-04-27 NOTE — Progress Notes (Signed)
Site area: rt fa sheath pulled at 1510; 3 rt fv sheaths pulled at 1525 Site Prior to Removal:  Level 0 Pressure Applied For: 35 minutes Manual:   yes Patient Status During Pull:  stable Post Pull Site:  Level 0 Post Pull Instructions Given:  yes Post Pull Pulses Present: rt dp palpable Dressing Applied:  Gauze and tegaderm Bedrest begins @ 0131 Comments: IV saline locked

## 2018-04-27 NOTE — Progress Notes (Signed)
Site area: rt ij venous sheath Site Prior to Removal:  Level 0 Pressure Applied For: 10 minutes Manual:   yes Patient Status During Pull:  stable Post Pull Site:  Level 0 Post Pull Instructions Given:  yes Post Pull Pulses Present: na Dressing Applied:  Gauze and tegaderm Bedrest begins @  Comments:   

## 2018-04-28 ENCOUNTER — Encounter (HOSPITAL_COMMUNITY): Payer: Self-pay | Admitting: Internal Medicine

## 2018-04-28 DIAGNOSIS — K2270 Barrett's esophagus without dysplasia: Secondary | ICD-10-CM | POA: Diagnosis not present

## 2018-04-28 DIAGNOSIS — Z8249 Family history of ischemic heart disease and other diseases of the circulatory system: Secondary | ICD-10-CM | POA: Diagnosis not present

## 2018-04-28 DIAGNOSIS — Z882 Allergy status to sulfonamides status: Secondary | ICD-10-CM | POA: Diagnosis not present

## 2018-04-28 DIAGNOSIS — Z888 Allergy status to other drugs, medicaments and biological substances status: Secondary | ICD-10-CM | POA: Diagnosis not present

## 2018-04-28 DIAGNOSIS — J45909 Unspecified asthma, uncomplicated: Secondary | ICD-10-CM | POA: Diagnosis not present

## 2018-04-28 DIAGNOSIS — I471 Supraventricular tachycardia: Secondary | ICD-10-CM | POA: Diagnosis not present

## 2018-04-28 DIAGNOSIS — G40909 Epilepsy, unspecified, not intractable, without status epilepticus: Secondary | ICD-10-CM | POA: Diagnosis not present

## 2018-04-28 DIAGNOSIS — Z79899 Other long term (current) drug therapy: Secondary | ICD-10-CM | POA: Diagnosis not present

## 2018-04-28 DIAGNOSIS — E039 Hypothyroidism, unspecified: Secondary | ICD-10-CM | POA: Diagnosis not present

## 2018-04-28 DIAGNOSIS — K219 Gastro-esophageal reflux disease without esophagitis: Secondary | ICD-10-CM | POA: Diagnosis not present

## 2018-04-28 DIAGNOSIS — E785 Hyperlipidemia, unspecified: Secondary | ICD-10-CM | POA: Diagnosis not present

## 2018-04-28 DIAGNOSIS — Z96641 Presence of right artificial hip joint: Secondary | ICD-10-CM | POA: Diagnosis not present

## 2018-04-28 LAB — POCT ACTIVATED CLOTTING TIME: Activated Clotting Time: 180 seconds

## 2018-04-28 NOTE — Telephone Encounter (Signed)
Request for medical record send by Thayer Headings

## 2018-06-05 ENCOUNTER — Encounter: Payer: Self-pay | Admitting: Internal Medicine

## 2018-06-05 ENCOUNTER — Ambulatory Visit (INDEPENDENT_AMBULATORY_CARE_PROVIDER_SITE_OTHER): Payer: Medicare Other | Admitting: Internal Medicine

## 2018-06-05 VITALS — BP 118/58 | HR 67 | Ht 68.5 in | Wt 143.0 lb

## 2018-06-05 DIAGNOSIS — I471 Supraventricular tachycardia: Secondary | ICD-10-CM | POA: Diagnosis not present

## 2018-06-05 NOTE — Progress Notes (Signed)
HPI [name] returns today for followup after undergoing EP study and catheter ablation of AVRT using a concealed epicardial AP as well as AVNRT. He has done well in the interim with no palpitations and no SVT.  Allergies  Allergen Reactions  . Dilantin [Phenytoin Sodium Extended] Other (See Comments)    Reaction not recalled by the patient (??)  . Lamotrigine Other (See Comments)    Drowsiness  . Sulfamethoxazole     Other  . Sulfonamide Derivatives Other (See Comments)    From childhood; reaction not recalled     Current Outpatient Medications  Medication Sig Dispense Refill  . acetaminophen (TYLENOL) 500 MG tablet Take 1,000 mg by mouth every 6 (six) hours as needed for mild pain or headache.     . cromolyn (NASALCROM) 5.2 MG/ACT nasal spray Place 1 spray into both nostrils 2 (two) times daily.    . Cyanocobalamin (VITAMIN B-12 SL) Place 1 tablet under the tongue daily with breakfast.    . donepezil (ARICEPT) 10 MG tablet Take 10 mg by mouth at bedtime.    . donepezil (ARICEPT) 23 MG TABS tablet Take 23 mg by mouth daily with breakfast.     . finasteride (PROSCAR) 5 MG tablet Take 5 mg by mouth daily.   0  . fluticasone (FLONASE) 50 MCG/ACT nasal spray Place 1-2 sprays into both nostrils daily as needed for allergies or rhinitis.    . Levetiracetam 750 MG TB24 Take 1 tablet (750 mg total) by mouth at bedtime. 90 each 4  . levothyroxine (SYNTHROID, LEVOTHROID) 100 MCG tablet Take 100 mcg by mouth daily before breakfast.    . loperamide (IMODIUM A-D) 2 MG tablet Take 2 mg by mouth daily as needed for diarrhea or loose stools.    . memantine (NAMENDA) 5 MG tablet Take 10 mg by mouth 2 (two) times daily.    . metoprolol succinate (TOPROL-XL) 25 MG 24 hr tablet Take 25 mg by mouth daily.    . Multiple Vitamin (MULTIVITAMIN) tablet Take 1 tablet by mouth daily.      Marland Kitchen omeprazole (PRILOSEC) 20 MG capsule Take 20 mg by mouth at bedtime.     . pravastatin (PRAVACHOL) 20 MG  tablet Take 20 mg by mouth at bedtime.    . traMADol (ULTRAM) 50 MG tablet Take 50 mg by mouth every 6 (six) hours as needed (for pain).     No current facility-administered medications for this visit.      Past Medical History:  Diagnosis Date  . Acute bronchitis 09/05/2009   Qualifier: Diagnosis of  By: Annamaria Boots MD, Clinton D   . Allergic rhinitis   . ALLERGIC RHINITIS 09/26/2007   Qualifier: Diagnosis of  By: June Leap    . Asthma   . Asthma with bronchitis 09/26/2007   Qualifier: Diagnosis of  By: June Leap    . Barrett syndrome   . Depressive disorder, not elsewhere classified   . Dizziness and giddiness   . Esophageal reflux   . 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 Memory loss   . Other and unspecified hyperlipidemia   . Pain in right ankle and joints of right foot 03/03/2017  . Post-traumatic osteoarthritis, right ankle and foot 03/03/2017  . SEIZURE DISORDER 09/26/2007   Qualifier: Diagnosis of  By: June Leap    . Seizure disorder ([place])   . SVT (supraventricular tachycardia) ([place]) 04/27/2018  .  Unspecified hypothyroidism     ROS:   All systems reviewed and negative except as noted in the HPI.   Past Surgical History:  Procedure Laterality Date  . APPENDECTOMY    . right hip replacement  2005  . SVT ABLATION N/A 04/27/2018   Procedure: SVT ABLATION;  Surgeon: Evans Lance, MD;  Location: [facility];  Service: Cardiovascular;  Laterality: N/A;     Family History  Problem Relation Age of Onset  . Heart disease Mother   . Memory loss Mother   . Asthma Sister   . Parkinson's disease Sister      Social History   Socioeconomic History  . Marital status: Married    Spouse name: Eugenia  . Number of children: 2  . Years of education: BA  . Highest education level: Not on file  Occupational History  . 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 Financial resource strain: Not on file  . Food insecurity:    Worry: Not on file    Inability: Not on file  . Transportation needs:    Medical: Not on file    Non-medical: Not on file  Tobacco Use  . Smoking status: Never Smoker  . Smokeless tobacco: Never Used  Substance and Sexual Activity  . Alcohol use: No    Alcohol/week: 0.0 standard drinks  . Drug use: No  . Sexual activity: Not on file  Lifestyle  . Physical activity:    Days per week: Not on file    Minutes per session: Not on file  . Stress: Not on file  Relationships  . Social connections:    Talks on phone: Not on file    Gets together: Not on file    Attends religious service: Not on file    Active member of club or organization: Not on file    Attends meetings of clubs or organizations: Not on file    Relationship status: Not on file  . Intimate partner violence:    Fear of current or ex partner: Not on file    Emotionally abused: Not on file    Physically abused: Not on file    Forced sexual activity: Not on file  Other Topics Concern  . Not on file  Social History Narrative   Patient lives at home with family. ([place])   Caffeine Use: up to 3 cups daily     BP (!) 118/58   Pulse 67   Ht 5' 8.5" (1.74 m)   Wt 143 lb (64.9 kg)   BMI 21.43 kg/m   Physical Exam:  Well appearing 84 yo man, NAD HEENT: Unremarkable Neck:  6 cm JVD, no thyromegally Lymphatics:  No adenopathy Back:  No CVA tenderness Lungs:  Clear with no wheezes HEART:  Regular rate rhythm, no murmurs, no rubs, no clicks Abd:  soft, positive bowel sounds, no organomegally, no rebound, no guarding Ext:  2 plus pulses, no edema, no cyanosis, no clubbing Skin:  No rashes no nodules Neuro:  CN II through XII intact, motor grossly intact  EKG - NSR  DEVICE  Normal device function.  See PaceArt for details.   Assess/Plan: 1. SVT - he is s/p EPS/RFA of SVT and is doing well. I will ask him to  undergo watchful waiting.  2. Dyslipidemia - he will continue his statin therapy.  Mikle Bosworth.D.

## 2018-06-05 NOTE — Patient Instructions (Addendum)
Medication Instructions:  Your physician recommends that you continue on your current medications as directed. Please refer to the Current Medication list given to you today.  Labwork: None ordered.  Testing/Procedures: None ordered.  Follow-Up: Your physician wants you to follow-up in: as needed with Dr. Taylor.      Any Other Special Instructions Will Be Listed Below (If Applicable).  If you need a refill on your cardiac medications before your next appointment, please call your pharmacy.   

## 2018-07-13 ENCOUNTER — Encounter: Payer: Self-pay | Admitting: Diagnostic Neuroimaging

## 2018-07-13 ENCOUNTER — Ambulatory Visit (INDEPENDENT_AMBULATORY_CARE_PROVIDER_SITE_OTHER): Payer: Medicare Other | Admitting: Diagnostic Neuroimaging

## 2018-07-13 ENCOUNTER — Encounter

## 2018-07-13 VITALS — BP 108/60 | HR 68 | Ht 68.5 in | Wt 142.8 lb

## 2018-07-13 DIAGNOSIS — G40209 Localization-related (focal) (partial) symptomatic epilepsy and epileptic syndromes with complex partial seizures, not intractable, without status epilepticus: Secondary | ICD-10-CM | POA: Diagnosis not present

## 2018-07-13 DIAGNOSIS — R413 Other amnesia: Secondary | ICD-10-CM

## 2018-07-13 DIAGNOSIS — G3184 Mild cognitive impairment, so stated: Secondary | ICD-10-CM | POA: Diagnosis not present

## 2018-07-13 MED ORDER — LEVETIRACETAM ER 750 MG PO TB24: 750.0000 mg | ORAL_TABLET | Freq: Every day | ORAL | 4 refills | Status: DC

## 2018-07-13 NOTE — Progress Notes (Signed)
[facility]  PATIENT: [name] DOB: July 16, 1934  REFERRING CLINICIAN:  HISTORY FROM: patient REASON FOR VISIT: follow up   HISTORICAL  CHIEF COMPLAINT:  Chief Complaint  Patient presents with  . Follow-up  . Memory Loss    MMSE 25/30   Donepezil 68m qhs, memantine 5 mg po bid  . Seizures    taking keppra    HISTORY OF PRESENT ILLNESS:   UPDATE (07/13/18, [facility]): Since last visit, doing well. Symptoms are mild, but progressive. Severity is mild. No alleviating or aggravating factors. Tolerating meds. Having more problems with driving directions and getting lost.  UPDATE 07/08/17: Since last visit doing well. Memory loss stable. Neuropsych testing reviewed. No seizures.   UPDATE 03/03/17: Since last visit, doing about the same, with some mild worsening of memory loss issues.  UPDATE 02/01/16: Since last visit, doing about the same. No seizures . Memory issues stable.   UPDATE 04/27/15: Since last visit, still c/o memory issues. Mainly names and appts. Tries to write things down and and stay organized. Now living at [place].   UPDATE 02/02/15: Since last visit, memory prob continue. No sz. More sleepy in daytime.   UPDATE 10/24/14: Since last visit, doing well. No sz. No spells. Memory loss is stable.   UPDATE 04/21/14: Since last visit, was doing well until 12/29/13, at coffee shop with friends, then had a 1 min episode of staring/memory loss. No convulsions or syncope. He thought this may have been a "mini sz" but he didn't contact our office. He contacted PCP, and was recommended to add LEV 2531min AM, in addition to his 50060mR tabs in the evening. Memory loss is stable. Still driving. Recently transitioned to retirement community.   UPDATE 05/27/13 (Dr. AthRexene [name] old right-handed gentleman who presents for followup consultation of his complex partial seizure disorder. He is accompanied by his wife today. This is his first visit after Dr. LovTressia Danasetirement and he was last seen by Dr. [name] on 12/02/2012, at which time Dr. [name] not change his antiseizure medication and felt hesitant to start donepezil for memory loss as it can lower seizure threshold. He suggested brain MRI and lab work including KepCapronvel. His MMSE at that time was 27/30, clock drawing was 4, animal fluency was 9. The patient has an underlying medical history of asthma, esophagitis, thyroid disease, arthritis status post right hip replacement surgery in 2005 and seizure disorder. He is currently on pravastatin, B12, axona, omeprazole, Synthroid, Advair, Keppra, vitamin B6, multivitamin.  PRIOR HPI (12/02/12, Dr. LovErling Cruz[age]ar old right-handed white married male with complex partial seizures first diagnosed in 1983 with normal CAT scan and MRI study of the brain but two EEGs showing evidence of left temporal lobe epileptiform activity. He developed side effects on Dilantin which was discontinued in 1986. He  had 2 simple partial seizures and Dilantin was reinstituted. Dilantin was changed to Tegretol but he developed lethargy on Tegretol and was tapered off.He had a seizure 05/10/1997 and Tegretol was restarted.He was tried of Depakote, but [facility] he began having episodes of strange feeling lasting 20-30 seconds with dj vu and memory loss. During these episodes he would be unresponsive according his wife who would shake him. MRI 06/05/2005 was normal except for mild atrophy and EEG 10/11/2005 was normal. He was placed on Lamictal but did not tolerate this well and continued to have seizures on 300 mg per day. He had a witnessed  seizure 08/26/2006 in Dr.[name]'s office. He was slowly changed to Keppra and has not had recurrent seizures but has had poor tolerance with sleepiness. He was changed from 500 mg twice per day to 500 mg ER twice daily and subsequently 250 mg levetiracetam 3 times per day. He is now on levetiracetam 500 mg ER once  daily . I saw him 06/19/09 for episodes of vague sensation with spinning lasting 20-30 seconds. These would occur sitting, lying, standing, and when turning over in bed. There was no loss of consciousness or aura of macropsia, micropsia, dj vu, strange odors or  tastes. His examination was normal with negative Dix-Hallpike hanging head test. He started  vertigo exercises and his symptoms resolved.  If he does not do his exercises the symptoms recur. He complains of memory loss He has been evaluated 06/2007 for memory loss with a neuropsychological battery showing superior intelligence, but loss of short-term and delayed memory. His mother had memory loss beginning in her 80s. He is able to pay the bills and right the checks.He can't  remember what day it is. He has difficulty with peoples names that he has known for years. He takes naps for 15 minutes after breakfast, and one to 2 naps after lunch. He drinks coffee during the day, small amounts  because of his Barrett's esophagitis which he states helps his memory. He does not snore at night. He awakens refreshed in the mornings. He is independent in activities of daily. 12/03/2011=(MMSE 26/30. CDT 4/4. AFT 12. Katz Index of independence in activities of daily living 6. Lawton-Brody instrumental activities of daily living scale 7. Neuropsychiatric inventory=0. Geriatric depression scale 2/15. Falls assessment tool score 5.CBC, CMP,  lipid profile, and TSH normal 11/13/11. He has not had recurrent seizures. He denies macropsia, micropsia, strange odors or tastes.He complains of daytime sleepiness and takeslevetiracetam 500 mg ER once at night. He complains of worsening long term memory, particularly noticed after gathering of classmates from his college days. He is performing [email or web address] and an exercise program with  improvement  in his memory He exercises by doing calisthenics 5 times per week, but  injured his right ankle many years ago when he fell off of a tank  and has recurrent right ankle pain, uses a brace. 12/02/2012=( MMSE 27/30. CDT 4/4. AFT 9. Katz 6. Lawton-Brody 7. Neuropsychiatric inventory=0.  Geriatric depression scale 1/15 Falls assessment tool score 5).   REVIEW OF SYSTEMS: Full 14 system review of systems performed and negative except: cough seizure disorder joint pain swelling.   ALLERGIES: Allergies  Allergen Reactions  . Dilantin [Phenytoin Sodium Extended] Other (See Comments)    Reaction not recalled by the patient (??)  . Lamotrigine Other (See Comments)    Drowsiness  . Sulfamethoxazole     Other  . Sulfonamide Derivatives Other (See Comments)    From childhood; reaction not recalled    HOME MEDICATIONS: Outpatient Medications Prior to Visit  Medication Sig Dispense Refill  . acetaminophen (TYLENOL) 500 MG tablet Take 1,000 mg by mouth every 6 (six) hours as needed for mild pain or headache.     . cromolyn (NASALCROM) 5.2 MG/ACT nasal spray Place 1 spray into both nostrils 2 (two) times daily.    . Cyanocobalamin (VITAMIN B-12 SL) Place 1 tablet under the tongue daily with breakfast.    . finasteride (PROSCAR) 5 MG tablet Take 5 mg by mouth daily.   0  . Levetiracetam 750 MG TB24 Take 1 tablet (750 mg total)   by mouth at bedtime. 90 each 4  . levothyroxine (SYNTHROID, LEVOTHROID) 100 MCG tablet Take 100 mcg by mouth daily before breakfast. Takes every day except sunday    . loperamide (IMODIUM A-D) 2 MG tablet Take 2 mg by mouth daily as needed for diarrhea or loose stools.    . memantine (NAMENDA) 5 MG tablet Take 10 mg by mouth 2 (two) times daily.    . metoprolol succinate (TOPROL-XL) 25 MG 24 hr tablet Take 25 mg by mouth daily.    . Multiple Vitamin (MULTIVITAMIN) tablet Take 1 tablet by mouth daily.      Marland Kitchen omeprazole (PRILOSEC) 20 MG capsule Take 20 mg by mouth at bedtime.     . pravastatin (PRAVACHOL) 20 MG tablet Take 20 mg by mouth at bedtime.    . predniSONE (DELTASONE) 10 MG tablet Take 10 mg by mouth daily  with breakfast.    . traMADol (ULTRAM) 50 MG tablet Take 50 mg by mouth every 6 (six) hours as needed (for pain).    . fluticasone (FLONASE) 50 MCG/ACT nasal spray Place 1-2 sprays into both nostrils daily as needed for allergies or rhinitis.    Marland Kitchen donepezil (ARICEPT) 10 MG tablet Take 10 mg by mouth at bedtime.    . donepezil (ARICEPT) 23 MG TABS tablet Take 23 mg by mouth daily with breakfast.      No facility-administered medications prior to visit.     PAST MEDICAL HISTORY: Past Medical History:  Diagnosis Date  . Acute bronchitis 09/05/2009   Qualifier: Diagnosis of  By: Annamaria Boots MD, Clinton D   . Allergic rhinitis   . ALLERGIC RHINITIS 09/26/2007   Qualifier: Diagnosis of  By: June Leap    . Asthma   . Asthma with bronchitis 09/26/2007   Qualifier: Diagnosis of  By: June Leap    . Barrett syndrome   . Depressive disorder, not elsewhere classified   . Dizziness and giddiness   . Esophageal reflux   . 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 Memory loss   . Other and unspecified hyperlipidemia   . Pain in right ankle and joints of right foot 03/03/2017  . Post-traumatic osteoarthritis, right ankle and foot 03/03/2017  . SEIZURE DISORDER 09/26/2007   Qualifier: Diagnosis of  By: June Leap    . Seizure disorder ([place])   . SVT (supraventricular tachycardia) ([place]) 04/27/2018  . Unspecified hypothyroidism     PAST SURGICAL HISTORY: Past Surgical History:  Procedure Laterality Date  . APPENDECTOMY    . right hip replacement  2005  . SVT ABLATION N/A 04/27/2018   Procedure: SVT ABLATION;  Surgeon: Evans Lance, MD;  Location: [facility];  Service: Cardiovascular;  Laterality: N/A;    FAMILY HISTORY: Family History  Problem Relation Age of Onset  . Heart disease Mother   . Memory loss Mother   . Asthma Sister   . Parkinson's disease Sister     SOCIAL  HISTORY:  Social History   Socioeconomic History  . Marital status: Married    Spouse name: Eugenia  . Number of children: 2  . Years of education: BA  . Highest education level: Not on file  Occupational History  . 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 Financial resource strain: Not on file  . Food insecurity:    Worry: Not on file    Inability: Not on file  .  Transportation needs:    Medical: Not on file    Non-medical: Not on file  Tobacco Use  . Smoking status: Never Smoker  . Smokeless tobacco: Never Used  Substance and Sexual Activity  . Alcohol use: No    Alcohol/week: 0.0 standard drinks  . Drug use: No  . Sexual activity: Not on file  Lifestyle  . Physical activity:    Days per week: Not on file    Minutes per session: Not on file  . Stress: Not on file  Relationships  . Social connections:    Talks on phone: Not on file    Gets together: Not on file    Attends religious service: Not on file    Active member of club or organization: Not on file    Attends meetings of clubs or organizations: Not on file    Relationship status: Not on file  . Intimate partner violence:    Fear of current or ex partner: Not on file    Emotionally abused: Not on file    Physically abused: Not on file    Forced sexual activity: Not on file  Other Topics Concern  . Not on file  Social History Narrative   Patient lives at home with family. ([place])   Caffeine Use: up to 3 cups daily     PHYSICAL EXAM  Vitals:   07/13/18 1325  BP: 108/60  Pulse: 68  Weight: 142 lb 12.8 oz (64.8 kg)  Height: 5' 8.5" (1.74 m)    Not recorded     Body mass index is 21.4 kg/m.  MMSE - Mini Mental State Exam 07/13/2018 07/08/2017 03/03/2017  Orientation to time 4 4 5  Orientation to Place 4 3 4  Registration 3 3 3  Attention/ Calculation 5 4 4  Recall 0 0 0  Language- name 2 objects 2 2 2  Language- repeat 1 1 1  Language- follow 3 step  command 3 3 3  Language- read & follow direction 1 1 1  Write a sentence 1 1 1  Copy design 1 1 1  Total score 25 23 25     GENERAL EXAM: Patient is in no distress; well developed, nourished and groomed; neck is supple  CARDIOVASCULAR: Regular rate and rhythm, no murmurs, no carotid bruits  NEUROLOGIC: MENTAL STATUS: awake, alert, language fluent, comprehension intact, naming intact, fund of knowledge appropriate.  CRANIAL NERVE: pupils equal and reactive to light, visual fields full to confrontation, extraocular muscles intact, no nystagmus, facial sensation and strength symmetric, hearing intact, palate elevates symmetrically, uvula midline, shoulder shrug symmetric, tongue midline. MOTOR: normal bulk and tone, full strength in the BUE, BLE SENSORY: normal and symmetric to light touch COORDINATION: finger-nose-finger, fine finger movements normal REFLEXES: deep tendon reflexes present and symmetric GAIT/STATION: SLOW, LIMPING GAIT; STOOPED POSTURE; UNSTEADY     DIAGNOSTIC DATA (LABS, IMAGING, TESTING) - I reviewed patient records, labs, notes, testing and imaging myself where available.  Lab Results  Component Value Date   WBC 3.9 (L) 04/20/2018      Component Value Date/Time   NA 138 04/20/2018 2155   No results found for: CHOL No results found for: HGBA1C No results found for: VITAMINB12 No results found for: TSH  12/12/12 MRI brain - Mild generalized cerebral atrophy. No significant changes compared with prior MRI scan dated 01/09/2010.  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  Results of the current evaluation demonstrate impairment in consolidation of new memories (particularly verbal information), reduced orientation to time, and semantic retrieval deficits. Meanwhile, visual-spatial skills (which  were always likely a relative strength), auditory attention, and abstract reasoning are all well above average (ie, in the high average to very superior range for his age). Furthermore, mental flexibility, immediate recall of new information, phonemic verbal fluency, auditory comprehension, and processing speed are al in the average range for his age. The patient and his wife deny any interference in his ability to manage complex ADLs including driving, medications, appointments and finances. As such, diagnostic criteria for a dementia syndrome are not met at this time. 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 His cognitive profile matches what is typically seen in prodromal Alzheimer's disease. I suspect he has a high level of cognitive reserve which as provided some protection for him; additionally, donepezil and mementine may be providing some additional benefit and reducing the rate of conversion of MCI to dementia. There is no evidence of a primary psychiatric disorder.    ASSESSMENT AND PLAN  84 y.o. year old male here with complex partial seizures and memory loss. May have had a seizure vs memory lapse spell in March 2015. No more events since on LEV 772m daily. 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   Dx:  No diagnosis found.    PLAN:  SEIZURE DISRODER (stable; last seizure 2015) - continue levetiracetam XR 7540mat bedtime  MEMORY LOSS (MCI --> MILD DEMENTIA) - continue donepezil + memantine (started per Dr. PaPhilip Aspen- recommend to transition away from driving (due to getting lost, more difficulty with driving directions) - continue exercise, mental and socially stimulating activities  Meds ordered this encounter  Medications  . Levetiracetam 750 MG TB24    Sig: Take 1 tablet (750 mg total) by mouth at bedtime.    Dispense:  90 each    Refill:  4   Return in about 1 year (around 07/14/2019).    VIPenni BombardMD 06/22/86/68111:5:72PM Certified in Neurology, Neurophysiology and Neuroimaging  [facility] [address] [id number] [id number]

## 2018-07-14 DIAGNOSIS — Z23 Encounter for immunization: Secondary | ICD-10-CM | POA: Diagnosis not present

## 2018-07-27 DIAGNOSIS — M545 Low back pain: Secondary | ICD-10-CM | POA: Diagnosis not present

## 2018-07-27 DIAGNOSIS — G40309 Generalized idiopathic epilepsy and epileptic syndromes, not intractable, without status epilepticus: Secondary | ICD-10-CM | POA: Diagnosis not present

## 2018-07-27 DIAGNOSIS — I351 Nonrheumatic aortic (valve) insufficiency: Secondary | ICD-10-CM | POA: Diagnosis not present

## 2018-07-27 DIAGNOSIS — M25571 Pain in right ankle and joints of right foot: Secondary | ICD-10-CM | POA: Diagnosis not present

## 2018-07-27 DIAGNOSIS — Z6823 Body mass index (BMI) 23.0-23.9, adult: Secondary | ICD-10-CM | POA: Diagnosis not present

## 2018-07-27 DIAGNOSIS — G3184 Mild cognitive impairment, so stated: Secondary | ICD-10-CM | POA: Diagnosis not present

## 2018-11-16 DIAGNOSIS — G40209 Localization-related (focal) (partial) symptomatic epilepsy and epileptic syndromes with complex partial seizures, not intractable, without status epilepticus: Secondary | ICD-10-CM | POA: Diagnosis not present

## 2018-11-16 DIAGNOSIS — G309 Alzheimer's disease, unspecified: Secondary | ICD-10-CM | POA: Diagnosis not present

## 2018-11-16 DIAGNOSIS — Z6823 Body mass index (BMI) 23.0-23.9, adult: Secondary | ICD-10-CM | POA: Diagnosis not present

## 2018-11-16 DIAGNOSIS — M545 Low back pain: Secondary | ICD-10-CM | POA: Diagnosis not present

## 2018-11-16 DIAGNOSIS — Z8679 Personal history of other diseases of the circulatory system: Secondary | ICD-10-CM | POA: Diagnosis not present

## 2018-11-16 DIAGNOSIS — E038 Other specified hypothyroidism: Secondary | ICD-10-CM | POA: Diagnosis not present

## 2018-12-22 DIAGNOSIS — R143 Flatulence: Secondary | ICD-10-CM | POA: Diagnosis not present

## 2018-12-22 DIAGNOSIS — R197 Diarrhea, unspecified: Secondary | ICD-10-CM | POA: Diagnosis not present

## 2019-03-31 DIAGNOSIS — R351 Nocturia: Secondary | ICD-10-CM | POA: Diagnosis not present

## 2019-03-31 DIAGNOSIS — N401 Enlarged prostate with lower urinary tract symptoms: Secondary | ICD-10-CM | POA: Diagnosis not present

## 2019-04-12 DIAGNOSIS — R197 Diarrhea, unspecified: Secondary | ICD-10-CM | POA: Diagnosis not present

## 2019-04-12 DIAGNOSIS — K2270 Barrett's esophagus without dysplasia: Secondary | ICD-10-CM | POA: Diagnosis not present

## 2019-05-07 DIAGNOSIS — W1839XA Other fall on same level, initial encounter: Secondary | ICD-10-CM | POA: Diagnosis not present

## 2019-05-07 DIAGNOSIS — S99922A Unspecified injury of left foot, initial encounter: Secondary | ICD-10-CM | POA: Diagnosis not present

## 2019-05-11 DIAGNOSIS — S92355A Nondisplaced fracture of fifth metatarsal bone, left foot, initial encounter for closed fracture: Secondary | ICD-10-CM | POA: Diagnosis not present

## 2019-05-13 DIAGNOSIS — E038 Other specified hypothyroidism: Secondary | ICD-10-CM | POA: Diagnosis not present

## 2019-05-13 DIAGNOSIS — Z125 Encounter for screening for malignant neoplasm of prostate: Secondary | ICD-10-CM | POA: Diagnosis not present

## 2019-05-13 DIAGNOSIS — E7849 Other hyperlipidemia: Secondary | ICD-10-CM | POA: Diagnosis not present

## 2019-05-25 DIAGNOSIS — Z5189 Encounter for other specified aftercare: Secondary | ICD-10-CM | POA: Diagnosis not present

## 2019-05-25 DIAGNOSIS — G3184 Mild cognitive impairment, so stated: Secondary | ICD-10-CM | POA: Diagnosis not present

## 2019-05-25 DIAGNOSIS — H9193 Unspecified hearing loss, bilateral: Secondary | ICD-10-CM | POA: Diagnosis not present

## 2019-05-25 DIAGNOSIS — R2681 Unsteadiness on feet: Secondary | ICD-10-CM | POA: Diagnosis not present

## 2019-05-25 DIAGNOSIS — Z8679 Personal history of other diseases of the circulatory system: Secondary | ICD-10-CM | POA: Diagnosis not present

## 2019-05-25 DIAGNOSIS — M545 Low back pain: Secondary | ICD-10-CM | POA: Diagnosis not present

## 2019-05-25 DIAGNOSIS — Z0000 Encounter for general adult medical examination without abnormal findings: Secondary | ICD-10-CM | POA: Diagnosis not present

## 2019-05-25 DIAGNOSIS — E039 Hypothyroidism, unspecified: Secondary | ICD-10-CM | POA: Diagnosis not present

## 2019-05-25 DIAGNOSIS — M25571 Pain in right ankle and joints of right foot: Secondary | ICD-10-CM | POA: Diagnosis not present

## 2019-05-25 DIAGNOSIS — I351 Nonrheumatic aortic (valve) insufficiency: Secondary | ICD-10-CM | POA: Diagnosis not present

## 2019-05-25 DIAGNOSIS — G40209 Localization-related (focal) (partial) symptomatic epilepsy and epileptic syndromes with complex partial seizures, not intractable, without status epilepticus: Secondary | ICD-10-CM | POA: Diagnosis not present

## 2019-05-25 DIAGNOSIS — F5104 Psychophysiologic insomnia: Secondary | ICD-10-CM | POA: Diagnosis not present

## 2019-05-25 DIAGNOSIS — K2270 Barrett's esophagus without dysplasia: Secondary | ICD-10-CM | POA: Diagnosis not present

## 2019-06-01 DIAGNOSIS — S92355D Nondisplaced fracture of fifth metatarsal bone, left foot, subsequent encounter for fracture with routine healing: Secondary | ICD-10-CM | POA: Diagnosis not present

## 2019-07-19 ENCOUNTER — Ambulatory Visit: Payer: Medicare Other | Admitting: Family Medicine

## 2019-07-19 ENCOUNTER — Ambulatory Visit: Payer: Medicare Other | Admitting: Adult Health

## 2019-07-19 NOTE — Progress Notes (Deleted)
PATIENT: [name] DOB: Mar 03, 1934  REASON FOR VISIT: follow up HISTORY FROM: patient  No chief complaint on file.    HISTORY OF PRESENT ILLNESS: Today 07/19/19 [name] is a 85 y.o. male here today for follow up for seizure and memory loss. He continues levetiracetam 750mg  twice daily. No seizure activity. He is also taking memantine 10mg  twice daily for memory.   HISTORY: (copied from Dr Gladstone Lighter note on 07/13/2018)  UPDATE (07/13/18, [facility]): Since last visit, doing well. Symptoms are mild, but progressive. Severity is mild. No alleviating or aggravating factors. Tolerating meds. Having more problems with driving directions and getting lost.  UPDATE 07/08/17: Since last visit doing well. Memory loss stable. Neuropsych testing reviewed. No seizures.   UPDATE 03/03/17: Since last visit, doing about the same, with some mild worsening of memory loss issues.  UPDATE 02/01/16: Since last visit, doing about the same. No seizures . Memory issues stable.   UPDATE 04/27/15: Since last visit, still c/o memory issues. Mainly names and appts. Tries to write things down and and stay organized. Now living at [place].   UPDATE 02/02/15: Since last visit, memory prob continue. No sz. More sleepy in daytime.   UPDATE 10/24/14: Since last visit, doing well. No sz. No spells. Memory loss is stable.   UPDATE 04/21/14: Since last visit, was doing well until 12/29/13, at coffee shop with friends, then had a 1 min episode of staring/memory loss. No convulsions or syncope. He thought this may have been a "mini sz" but he didn't contact our office. He contacted PCP, and was recommended to add LEV 250mg  in AM, in addition to his 500mg  XR tabs in the evening. Memory loss is stable. Still driving. Recently transitioned to retirement community.   UPDATE 05/27/13 (Dr. Rexene Alberts): 85 year old right-handed gentleman who presents for followup consultation of his complex partial seizure disorder. He is  accompanied by his wife today. This is his first visit after Dr. Tressia Danas retirement and he was last seen by Dr. Jeneen Rinks love on 12/02/2012, at which time Dr. Erling Cruz did not change his antiseizure medication and felt hesitant to start donepezil for memory loss as it can lower seizure threshold. He suggested brain MRI and lab work including [place] level. His MMSE at that time was 27/30, clock drawing was 4, animal fluency was 9. The patient has an underlying medical history of asthma, esophagitis, thyroid disease, arthritis status post right hip replacement surgery in 2005 and seizure disorder. He is currently on pravastatin, B12, axona, omeprazole, Synthroid, Advair, Keppra, vitamin B6, multivitamin.  PRIOR HPI (12/02/12, Dr. Erling Cruz): 85 year old right-handed white married male with complex partial seizures first diagnosed in 1983 with normal CAT scan and MRI study of the brain but two EEGs showing evidence of left temporal lobe epileptiform activity. He developed side effects on Dilantin which was discontinued in 1986. He  had 2 simple partial seizures and Dilantin was reinstituted. Dilantin was changed to Tegretol but he developed lethargy on Tegretol and was tapered off.He had a seizure 05/10/1997 and Tegretol was restarted.He was tried of Depakote, but was tapered off Depakote at [facility]. In 2006 he began having episodes of strange feeling lasting 20-30 seconds with dj vu and memory loss. During these episodes he would be unresponsive according his wife who would shake him. MRI 06/05/2005 was normal except for mild atrophy and EEG 10/11/2005 was normal. He was placed on Lamictal but did not tolerate this well and continued to have seizures on 300 mg per  day. He had a witnessed seizure 08/26/2006 in [place] office. He was slowly changed to [place] and has not had recurrent seizures but has had poor tolerance with sleepiness. He was changed from 500 mg twice per day to 500 mg ER twice daily and subsequently 250 mg  levetiracetam 3 times per day. He is now on levetiracetam 500 mg ER once daily . I saw him 06/19/09 for episodes of vague sensation with spinning lasting 20-30 seconds. These would occur sitting, lying, standing, and when turning over in bed. There was no loss of consciousness or aura of macropsia, micropsia, dj vu, strange odors or  tastes. His examination was normal with negative Dix-Hallpike hanging head test. He started  vertigo exercises and his symptoms resolved.  If he does not do his exercises the symptoms recur. He complains of memory loss He has been evaluated 06/2007 for memory loss with a neuropsychological battery showing superior intelligence, but loss of short-term and delayed memory. His mother had memory loss beginning in her 16s. He is able to pay the bills and right the checks.He can't  remember what day it is. He has difficulty with peoples names that he has known for years. He takes naps for 15 minutes after breakfast, and one to 2 naps after lunch. He drinks coffee during the day, small amounts  because of his Barrett's esophagitis which he states helps his memory. He does not snore at night. He awakens refreshed in the mornings. He is independent in activities of daily. 12/03/2011=(MMSE 26/30. CDT 4/4. AFT 12. [name] Index of independence in activities of daily living 6. Lawton-Brody instrumental activities of daily living scale 7. Neuropsychiatric inventory=0. Geriatric depression scale 2/15. Falls assessment tool score 5.CBC, CMP,  lipid profile, and TSH normal 11/13/11. He has not had recurrent seizures. He denies macropsia, micropsia, strange odors or tastes.He complains of daytime sleepiness and takeslevetiracetam 500 mg ER once at night. He complains of worsening long term memory, particularly noticed after gathering of classmates from his college days. He is performing [email or web address] and an exercise program with  improvement  in his memory He exercises by doing calisthenics 5 times per week,  but  injured his right ankle many years ago when he fell off of a tank and has recurrent right ankle pain, uses a brace. 12/02/2012=( MMSE 27/30. CDT 4/4. AFT 9. [name] 6. Law[place]-Brody 7. Neuropsychiatric inventory=0.  Geriatric depression scale 1/15 Falls assessment tool score 5).   REVIEW OF SYSTEMS: Out of a complete 14 system review of symptoms, the patient complains only of the following symptoms, and all other reviewed systems are negative.  ALLERGIES: Allergies  Allergen Reactions   Dilantin [Phenytoin Sodium Extended] Other (See Comments)    Reaction not recalled by the patient (??)   Lamotrigine Other (See Comments)    Drowsiness   Sulfamethoxazole     Other   Sulfonamide Derivatives Other (See Comments)    From childhood; reaction not recalled    HOME MEDICATIONS: Outpatient Medications Prior to Visit  Medication Sig Dispense Refill   acetaminophen (TYLENOL) 500 MG tablet Take 1,000 mg by mouth every 6 (six) hours as needed for mild pain or headache.      cromolyn (NASALCROM) 5.2 MG/ACT nasal spray Place 1 spray into both nostrils 2 (two) times daily.     Cyanocobalamin (VITAMIN B-12 SL) Place 1 tablet under the tongue daily with breakfast.     finasteride (PROSCAR) 5 MG tablet Take 5 mg by mouth daily.   0  fluticasone (FLONASE) 50 MCG/ACT nasal spray Place 1-2 sprays into both nostrils daily as needed for allergies or rhinitis.     Levetiracetam 750 MG TB24 Take 1 tablet (750 mg total) by mouth at bedtime. 90 each 4   levothyroxine (SYNTHROID, LEVOTHROID) 100 MCG tablet Take 100 mcg by mouth daily before breakfast. Takes every day except sunday     loperamide (IMODIUM A-D) 2 MG tablet Take 2 mg by mouth daily as needed for diarrhea or loose stools.     memantine (NAMENDA) 5 MG tablet Take 10 mg by mouth 2 (two) times daily.     metoprolol succinate (TOPROL-XL) 25 MG 24 hr tablet Take 25 mg by mouth daily.     Multiple Vitamin (MULTIVITAMIN) tablet Take 1 tablet  by mouth daily.       omeprazole (PRILOSEC) 20 MG capsule Take 20 mg by mouth at bedtime.      pravastatin (PRAVACHOL) 20 MG tablet Take 20 mg by mouth at bedtime.     predniSONE (DELTASONE) 10 MG tablet Take 10 mg by mouth daily with breakfast.     traMADol (ULTRAM) 50 MG tablet Take 50 mg by mouth every 6 (six) hours as needed (for pain).     No facility-administered medications prior to visit.     PAST MEDICAL HISTORY: Past Medical History:  Diagnosis Date   Acute bronchitis 09/05/2009   Qualifier: Diagnosis of  By: Annamaria Boots MD, Clinton D    Allergic rhinitis    ALLERGIC RHINITIS 09/26/2007   Qualifier: Diagnosis of  By: June Leap     Asthma    Asthma with bronchitis 09/26/2007   Qualifier: Diagnosis of  By: June Leap     Barrett syndrome    Depressive disorder, not elsewhere classified    Dizziness and giddiness    Esophageal reflux    Localization-related (focal) (partial) epilepsy and epileptic syndromes with complex partial seizures, with intractable epilepsy    Memory loss    Other and unspecified hyperlipidemia    Pain in right ankle and joints of right foot 03/03/2017   Post-traumatic osteoarthritis, right ankle and foot 03/03/2017   SEIZURE DISORDER 09/26/2007   Qualifier: Diagnosis of  By: June Leap     Seizure disorder [facility])    SVT (supraventricular tachycardia) ([place]) 04/27/2018   Unspecified hypothyroidism     PAST SURGICAL HISTORY: Past Surgical History:  Procedure Laterality Date   APPENDECTOMY     right hip replacement  2005   SVT ABLATION N/A 04/27/2018   Procedure: SVT ABLATION;  Surgeon: Evans Lance, MD;  Location: [facility];  Service: Cardiovascular;  Laterality: N/A;    FAMILY HISTORY: Family History  Problem Relation Age of Onset   Heart disease Mother    Memory loss Mother    Asthma Sister    Parkinson's disease Sister     SOCIAL HISTORY: Social History   Socioeconomic History   Marital  status: Married    Spouse name: Eugenia   Number of children: 2   Years of education: BA   Highest education level: Not on file  Occupational History   Occupation: retired Nurse, 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  Social Designer, fashion/clothing strain: Not on file   Food insecurity    Worry: Not on file    Inability: Not on file   Transportation needs    Medical: Not on file    Non-medical: Not on file  Tobacco Use   Smoking status: Never  Smoker   Smokeless tobacco: Never Used  Substance and Sexual Activity   Alcohol use: No    Alcohol/week: 0.0 standard drinks   Drug use: No   Sexual activity: Not on file  Lifestyle   Physical activity    Days per week: Not on file    Minutes per session: Not on file   Stress: Not on file  Relationships   Social connections    Talks on phone: Not on file    Gets together: Not on file    Attends religious service: Not on file    Active member of club or organization: Not on file    Attends meetings of clubs or organizations: Not on file    Relationship status: Not on file   Intimate partner violence    Fear of current or ex partner: Not on file    Emotionally abused: Not on file    Physically abused: Not on file    Forced sexual activity: Not on file  Other Topics Concern   Not on file  Social History Narrative   Patient lives at home with family. ([place])   Caffeine Use: up to 3 cups daily      PHYSICAL EXAM  There were no vitals filed for this visit. There is no height or weight on file to calculate BMI.  Generalized: Well developed, in no acute distress  Cardiology: normal rate and rhythm, no murmur noted Neurological examination  Mentation: Alert oriented to time, place, history taking. Follows all commands speech and language fluent Cranial nerve II-XII: Pupils were equal round reactive to light. Extraocular movements were full, visual field were full on confrontational test. Facial sensation and  strength were normal. Uvula tongue midline. Head turning and shoulder shrug  were normal and symmetric. Motor: The motor testing reveals 5 over 5 strength of all 4 extremities. Good symmetric motor tone is noted throughout.  Sensory: Sensory testing is intact to soft touch on all 4 extremities. No evidence of extinction is noted.  Coordination: Cerebellar testing reveals good finger-nose-finger and heel-to-shin bilaterally.  Gait and station: Gait is normal. Tandem gait is normal. Romberg is negative. No drift is seen.  Reflexes: Deep tendon reflexes are symmetric and normal bilaterally.   DIAGNOSTIC DATA (LABS, IMAGING, TESTING) - I reviewed patient records, labs, notes, testing and imaging myself where available.  MMSE - Mini Mental State Exam 07/13/2018 07/08/2017 03/03/2017  Orientation to time 4 4 5   Orientation to Place 4 3 4   Registration 3 3 3   Attention/ Calculation 5 4 4   Recall 0 0 0  Language- name 2 objects 2 2 2   Language- repeat 1 1 1   Language- follow 3 step command 3 3 3   Language- read & follow direction 1 1 1   Write a sentence 1 1 1   Copy design 1 1 1   Total score 25 23 25      Lab Results  Component Value Date   WBC 3.9 (L) 04/20/2018   HGB 12.5 (L) 04/20/2018   HCT 39.4 04/20/2018   MCV 96.1 04/20/2018   PLT 129 (L) 04/20/2018      Component Value Date/Time   NA 138 04/20/2018 2155   K 4.1 04/20/2018 2155   CL 104 04/20/2018 2155   CO2 27 04/20/2018 2155   GLUCOSE 118 (H) 04/20/2018 2155   BUN 9 04/20/2018 2155   CREATININE 0.76 04/20/2018 2155   CALCIUM 8.6 (L) 04/20/2018 2155   GFRNONAA >60 04/20/2018 2155   GFRAA >  60 04/20/2018 2155   No results found for: CHOL, HDL, LDLCALC, LDLDIRECT, TRIG, CHOLHDL No results found for: HGBA1C No results found for: VITAMINB12 No results found for: TSH     ASSESSMENT AND PLAN 85 y.o. year old male  has a past medical history of Acute bronchitis (09/05/2009), Allergic rhinitis, ALLERGIC RHINITIS (09/26/2007),  Asthma, Asthma with bronchitis (09/26/2007), Barrett syndrome, Depressive disorder, not elsewhere classified, Dizziness and giddiness, Esophageal reflux, Localization-related (focal) (partial) epilepsy and epileptic syndromes with complex partial seizures, with intractable epilepsy, Memory loss, Other and unspecified hyperlipidemia, Pain in right ankle and joints of right foot (03/03/2017), Post-traumatic osteoarthritis, right ankle and foot (03/03/2017), SEIZURE DISORDER (09/26/2007), Seizure disorder ([place]), SVT (supraventricular tachycardia) ([place]) (04/27/2018), and Unspecified hypothyroidism. here with ***  No diagnosis found.     No orders of the defined types were placed in this encounter.    No orders of the defined types were placed in this encounter.     I spent 15 minutes with the patient. 50% of this time was spent counseling and educating patient on plan of care and medications.    Debbora Presto, FNP-C 07/19/2019, 1:07 PM [facility] [address] [place], [place] [address] [phone]

## 2019-07-20 ENCOUNTER — Encounter: Payer: Self-pay | Admitting: Family Medicine

## 2019-07-28 NOTE — Progress Notes (Signed)
PATIENT: [name] DOB: July 05, 1934  REASON FOR VISIT: follow up HISTORY FROM: patient  Chief Complaint  Patient presents with  . Follow-up    Room 5, with wife. Memory. "Memory isnt as good as it used to be."     HISTORY OF PRESENT ILLNESS: Today 07/29/19 [name] is a 85 y.o. male here today for follow up for amnesic cognitive impairment. He continues donepizil 10mg  daily, memantine 10mg  twice daily and levetiracetam ER 750mg  daily. He is feeling well. He feels memory is stable from last visit. He is tolerating medications without adverse effects. He does continue to drive short distances and does not drive alone. No accidents. He is able to perform all ADL's. He continues daily exercise and plays suduko.   HISTORY: (copied from Dr Gladstone Lighter note on 07/13/2018)  UPDATE (07/13/18, [facility]): Since last visit, doing well. Symptoms are mild, but progressive. Severity is mild. No alleviating or aggravating factors. Tolerating meds. Having more problems with driving directions and getting lost.  UPDATE 07/08/17: Since last visit doing well. Memory loss stable. Neuropsych testing reviewed. No seizures.   UPDATE 03/03/17: Since last visit, doing about the same, with some mild worsening of memory loss issues.  UPDATE 02/01/16: Since last visit, doing about the same. No seizures . Memory issues stable.   UPDATE 04/27/15: Since last visit, still c/o memory issues. Mainly names and appts. Tries to write things down and and stay organized. Now living at [name].   UPDATE 02/02/15: Since last visit, memory prob continue. No sz. More sleepy in daytime.   UPDATE 10/24/14: Since last visit, doing well. No sz. No spells. Memory loss is stable.   UPDATE 04/21/14: Since last visit, was doing well until 12/29/13, at coffee shop with friends, then had a 1 min episode of staring/memory loss. No convulsions or syncope. He thought this may have been a "mini sz" but he didn't contact our office. He  contacted PCP, and was recommended to add LEV 250mg  in AM, in addition to his 500mg  XR tabs in the evening. Memory loss is stable. Still driving. Recently transitioned to retirement community.   UPDATE 05/27/13 (Dr. Rexene Alberts): 85 year old right-handed gentleman who presents for followup consultation of his complex partial seizure disorder. He is accompanied by his wife today. This is his first visit after Dr. Tressia Danas retirement and he was last seen by Dr. Jeneen Rinks love on 12/02/2012, at which time Dr. Erling Cruz did not change his antiseizure medication and felt hesitant to start donepezil for memory loss as it can lower seizure threshold. He suggested brain MRI and lab work including Matthews level. His MMSE at that time was 27/30, clock drawing was 4, animal fluency was 9. The patient has an underlying medical history of asthma, esophagitis, thyroid disease, arthritis status post right hip replacement surgery in 2005 and seizure disorder. He is currently on pravastatin, B12, axona, omeprazole, Synthroid, Advair, Keppra, vitamin B6, multivitamin.  PRIOR HPI (12/02/12, Dr. Erling Cruz): 85 year old right-handed white married male with complex partial seizures first diagnosed in 1983 with normal CAT scan and MRI study of the brain but two EEGs showing evidence of left temporal lobe epileptiform activity. He developed side effects on Dilantin which was discontinued in 1986. He  had 2 simple partial seizures and Dilantin was reinstituted. Dilantin was changed to Tegretol but he developed lethargy on Tegretol and was tapered off.He had a seizure 05/10/1997 and Tegretol was restarted.He was tried of Depakote, but was tapered off Depakote at [facility]. In 2006  he began having episodes of strange feeling lasting 20-30 seconds with dj vu and memory loss. During these episodes he would be unresponsive according his wife who would shake him. MRI 06/05/2005 was normal except for mild atrophy and EEG 10/11/2005 was normal. He was placed on Lamictal  but did not tolerate this well and continued to have seizures on 300 mg per day. He had a witnessed seizure 08/26/2006 in [place] office. He was slowly changed to [place] and has not had recurrent seizures but has had poor tolerance with sleepiness. He was changed from 500 mg twice per day to 500 mg ER twice daily and subsequently 250 mg levetiracetam 3 times per day. He is now on levetiracetam 500 mg ER once daily . I saw him 06/19/09 for episodes of vague sensation with spinning lasting 20-30 seconds. These would occur sitting, lying, standing, and when turning over in bed. There was no loss of consciousness or aura of macropsia, micropsia, dj vu, strange odors or  tastes. His examination was normal with negative Dix-Hallpike hanging head test. He started  vertigo exercises and his symptoms resolved.  If he does not do his exercises the symptoms recur. He complains of memory loss He has been evaluated 06/2007 for memory loss with a neuropsychological battery showing superior intelligence, but loss of short-term and delayed memory. His mother had memory loss beginning in her 46s. He is able to pay the bills and right the checks.He can't  remember what day it is. He has difficulty with peoples names that he has known for years. He takes naps for 15 minutes after breakfast, and one to 2 naps after lunch. He drinks coffee during the day, small amounts  because of his Barrett's esophagitis which he states helps his memory. He does not snore at night. He awakens refreshed in the mornings. He is independent in activities of daily. 12/03/2011=(MMSE 26/30. CDT 4/4. AFT 12. [name] Index of independence in activities of daily living 6. Lawton-Brody instrumental activities of daily living scale 7. Neuropsychiatric inventory=0. Geriatric depression scale 2/15. Falls assessment tool score 5.CBC, CMP,  lipid profile, and TSH normal 11/13/11. He has not had recurrent seizures. He denies macropsia, micropsia, strange odors or  tastes.He complains of daytime sleepiness and takeslevetiracetam 500 mg ER once at night. He complains of worsening long term memory, particularly noticed after gathering of classmates from his college days. He is performing [email or web address] and an exercise program with  improvement  in his memory He exercises by doing calisthenics 5 times per week, but  injured his right ankle many years ago when he fell off of a tank and has recurrent right ankle pain, uses a brace. 12/02/2012=( MMSE 27/30. CDT 4/4. AFT 9. [name] 6. Lawton-Brody 7. Neuropsychiatric inventory=0.  Geriatric depression scale 1/15 Falls assessment tool score 5).  REVIEW OF SYSTEMS: Out of a complete 14 system review of symptoms, the patient complains only of the following symptoms, none and all other reviewed systems are negative.  ALLERGIES: Allergies  Allergen Reactions  . Dilantin [Phenytoin Sodium Extended] Other (See Comments)    Reaction not recalled by the patient (??)  . Lamotrigine Other (See Comments)    Drowsiness Drowsiness Drowsiness Drowsiness  Drowsiness  . Phenytoin Sodium Extended Other (See Comments)    Reaction not recalled by the patient (??)  . Sulfa Antibiotics Other (See Comments)    From childhood; reaction not recalled  . Sulfamethoxazole Other (See Comments)    Other Other  . Sulfonamide Derivatives Other (  See Comments)    From childhood; reaction not recalled    HOME MEDICATIONS: Outpatient Medications Prior to Visit  Medication Sig Dispense Refill  . acetaminophen (TYLENOL) 500 MG tablet Take 1,000 mg by mouth every 6 (six) hours as needed for mild pain or headache.     . cromolyn (NASALCROM) 5.2 MG/ACT nasal spray Place 1 spray into both nostrils 2 (two) times daily.    . Cyanocobalamin (VITAMIN B-12 SL) Place 1 tablet under the tongue daily with breakfast.    . finasteride (PROSCAR) 5 MG tablet Take 5 mg by mouth daily.   0  . fluticasone (FLONASE) 50 MCG/ACT nasal spray Place 1-2 sprays into  both nostrils daily as needed for allergies or rhinitis.    . Levetiracetam 750 MG TB24 Take 1 tablet (750 mg total) by mouth at bedtime. 90 each 4  . levothyroxine (SYNTHROID, LEVOTHROID) 100 MCG tablet Take 100 mcg by mouth daily before breakfast. Takes every day except sunday    . loperamide (IMODIUM A-D) 2 MG tablet Take 2 mg by mouth daily as needed for diarrhea or loose stools.    . memantine (NAMENDA) 5 MG tablet Take 10 mg by mouth 2 (two) times daily.    . metoprolol succinate (TOPROL-XL) 25 MG 24 hr tablet Take 25 mg by mouth daily.    . Multiple Vitamin (MULTIVITAMIN) tablet Take 1 tablet by mouth daily.      Marland Kitchen omeprazole (PRILOSEC) 20 MG capsule Take 20 mg by mouth at bedtime.     . pravastatin (PRAVACHOL) 20 MG tablet Take 20 mg by mouth at bedtime.    . predniSONE (DELTASONE) 10 MG tablet Take 10 mg by mouth daily with breakfast.    . traMADol (ULTRAM) 50 MG tablet Take 50 mg by mouth every 6 (six) hours as needed (for pain).     No facility-administered medications prior to visit.     PAST MEDICAL HISTORY: Past Medical History:  Diagnosis Date  . Acute bronchitis 09/05/2009   Qualifier: Diagnosis of  By: Annamaria Boots MD, Clinton D   . Allergic rhinitis   . ALLERGIC RHINITIS 09/26/2007   Qualifier: Diagnosis of  By: June Leap    . Asthma   . Asthma with bronchitis 09/26/2007   Qualifier: Diagnosis of  By: June Leap    . Barrett syndrome   . Depressive disorder, not elsewhere classified   . Dizziness and giddiness   . Esophageal reflux   . 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 Memory loss   . Other and unspecified hyperlipidemia   . Pain in right ankle and joints of right foot 03/03/2017  . Post-traumatic osteoarthritis, right ankle and foot 03/03/2017  . SEIZURE DISORDER 09/26/2007   Qualifier: Diagnosis of  By: June Leap    . Seizure disorder ([place])   . SVT  (supraventricular tachycardia) ([place]) 04/27/2018  . Unspecified hypothyroidism     PAST SURGICAL HISTORY: Past Surgical History:  Procedure Laterality Date  . APPENDECTOMY    . right hip replacement  2005  . SVT ABLATION N/A 04/27/2018   Procedure: SVT ABLATION;  Surgeon: Evans Lance, MD;  Location: [facility];  Service: Cardiovascular;  Laterality: N/A;    FAMILY HISTORY: Family History  Problem Relation Age of Onset  . Heart disease Mother   . Memory loss Mother   . Asthma Sister   . Parkinson's disease Sister     SOCIAL  HISTORY: Social History   Socioeconomic History  . Marital status: Married    Spouse name: Eugenia  . Number of children: 2  . Years of education: BA  . Highest education level: Not on file  Occupational History  . 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 Financial resource strain: Not on file  . Food insecurity    Worry: Not on file    Inability: Not on file  . Transportation needs    Medical: Not on file    Non-medical: Not on file  Tobacco Use  . Smoking status: Never Smoker  . Smokeless tobacco: Never Used  Substance and Sexual Activity  . Alcohol use: No    Alcohol/week: 0.0 standard drinks  . Drug use: No  . Sexual activity: Not on file  Lifestyle  . Physical activity    Days per week: Not on file    Minutes per session: Not on file  . Stress: Not on file  Relationships  . Social Herbalist on phone: Not on file    Gets together: Not on file    Attends religious service: Not on file    Active member of club or organization: Not on file    Attends meetings of clubs or organizations: Not on file    Relationship status: Not on file  . Intimate partner violence    Fear of current or ex partner: Not on file    Emotionally abused: Not on file    Physically abused: Not on file    Forced sexual activity: Not on file  Other Topics Concern  . Not on file  Social History Narrative    Patient lives at home with family. ([place])   Caffeine Use: up to 3 cups daily      PHYSICAL EXAM  There were no vitals filed for this visit. There is no height or weight on file to calculate BMI.  Generalized: Well developed, in no acute distress  Cardiology: normal rate and rhythm, no murmur noted Neurological examination  Mentation: Alert oriented to time, place, history taking. Follows all commands speech and language fluent Cranial nerve II-XII: Pupils were equal round reactive to light. Extraocular movements were full, visual field were full on confrontational test. Facial sensation and strength were normal. Uvula tongue midline. Head turning and shoulder shrug  were normal and symmetric. Motor: The motor testing reveals 5 over 5 strength of all 4 extremities. Good symmetric motor tone is noted throughout.  Sensory: Sensory testing is intact to soft touch on all 4 extremities. No evidence of extinction is noted.  Coordination: Cerebellar testing reveals good finger-nose-finger and heel-to-shin bilaterally.  Gait and station: Gait is normal.    DIAGNOSTIC DATA (LABS, IMAGING, TESTING) - I reviewed patient records, labs, notes, testing and imaging myself where available.  MMSE - Mini Mental State Exam 07/29/2019 07/13/2018 07/08/2017  Orientation to time 0 4 4  Orientation to Place 5 4 3   Registration 3 3 3   Attention/ Calculation 5 5 4   Recall 0 0 0  Language- name 2 objects 2 2 2   Language- repeat 1 1 1   Language- follow 3 step command 3 3 3   Language- read & follow direction 1 1 1   Write a sentence 1 1 1   Write a sentence-comments drew clock 10 plast 11 - -  Copy design 1 1 1   Copy design-comments named 5 animals - -  Total score 22 25 23  Lab Results  Component Value Date   WBC 3.9 (L) 04/20/2018   HGB 12.5 (L) 04/20/2018   HCT 39.4 04/20/2018   MCV 96.1 04/20/2018   PLT 129 (L) 04/20/2018      Component Value Date/Time   NA 138 04/20/2018 2155   K  4.1 04/20/2018 2155   CL 104 04/20/2018 2155   CO2 27 04/20/2018 2155   GLUCOSE 118 (H) 04/20/2018 2155   BUN 9 04/20/2018 2155   CREATININE 0.76 04/20/2018 2155   CALCIUM 8.6 (L) 04/20/2018 2155   GFRNONAA >60 04/20/2018 2155   GFRAA >60 04/20/2018 2155   No results found for: CHOL, HDL, LDLCALC, LDLDIRECT, TRIG, CHOLHDL No results found for: HGBA1C No results found for: VITAMINB12 No results found for: TSH     ASSESSMENT AND PLAN 85 y.o. year old male  has a past medical history of Acute bronchitis (09/05/2009), Allergic rhinitis, ALLERGIC RHINITIS (09/26/2007), Asthma, Asthma with bronchitis (09/26/2007), Barrett syndrome, Depressive disorder, not elsewhere classified, Dizziness and giddiness, Esophageal reflux, Localization-related (focal) (partial) epilepsy and epileptic syndromes with complex partial seizures, with intractable epilepsy, Memory loss, Other and unspecified hyperlipidemia, Pain in right ankle and joints of right foot (03/03/2017), Post-traumatic osteoarthritis, right ankle and foot (03/03/2017), SEIZURE DISORDER (09/26/2007), Seizure disorder ([place]), SVT (supraventricular tachycardia) ([place]) (04/27/2018), and Unspecified hypothyroidism. here with     ICD-10-CM   1. Amnestic MCI (mild cognitive impairment with memory loss)  G31.84   2. Partial symptomatic epilepsy with complex partial seizures, not intractable, without status epilepticus ([place])  G40.209     Mario is doing well. We will continue memantine 10mg  twice daily, donapezil 10mg  daily and levetiracetam 750mg  daily. He will continue close follow up with PCP for labs. I have advised caution with driving and advised he not drive alone. Family should reassess safety continuously. Formal driving evaluation discussed. Healthy diet, regular exercise and memory compensation strategies advised. He will follow up in 1 year, sooner if needed. He and his wife verbalizes understanding and agreement with this plan.    No orders of the  defined types were placed in this encounter.    No orders of the defined types were placed in this encounter.     I spent 15 minutes with the patient. 50% of this time was spent counseling and educating patient on plan of care and medications.    Debbora Presto, FNP-C 07/29/2019, 9:11 AM [facility] [address] [place], [place] [address] [phone]

## 2019-07-29 ENCOUNTER — Other Ambulatory Visit: Payer: Self-pay

## 2019-07-29 ENCOUNTER — Ambulatory Visit (INDEPENDENT_AMBULATORY_CARE_PROVIDER_SITE_OTHER): Payer: Medicare Other | Admitting: Family Medicine

## 2019-07-29 ENCOUNTER — Encounter: Payer: Self-pay | Admitting: Family Medicine

## 2019-07-29 VITALS — BP 104/54 | HR 60 | Ht 63.0 in | Wt 134.2 lb

## 2019-07-29 DIAGNOSIS — G3184 Mild cognitive impairment, so stated: Secondary | ICD-10-CM | POA: Diagnosis not present

## 2019-07-29 DIAGNOSIS — G40209 Localization-related (focal) (partial) symptomatic epilepsy and epileptic syndromes with complex partial seizures, not intractable, without status epilepticus: Secondary | ICD-10-CM | POA: Diagnosis not present

## 2019-07-29 MED ORDER — LEVETIRACETAM ER 750 MG PO TB24: 750.0000 mg | ORAL_TABLET | Freq: Every day | ORAL | 3 refills | Status: DC

## 2019-07-29 NOTE — Patient Instructions (Signed)
We will continue levetiracetam 750mg  tablet at bedtime  We will continue Namenda and Aricpet as prescribed   Follow up with [place] in 1 year, sooner if needed    memory Compensation Strategies  1. Use "WARM" strategy.  W= write it down  A= associate it  R= repeat it  M= make a mental note  2.   You can keep a Social worker.  Use a 3-ring notebook with sections for the following: calendar, important names and phone numbers,  medications, doctors' names/phone numbers, lists/reminders, and a section to journal what you did  each day.   3.    Use a calendar to write appointments down.  4.    Write yourself a schedule for the day.  This can be placed on the calendar or in a separate section of the Memory Notebook.  Keeping a  regular schedule can help memory.  5.    Use medication organizer with sections for each day or morning/evening pills.  You may need help loading it  6.    Keep a basket, or pegboard by the door.  Place items that you need to take out with you in the basket or on the pegboard.  You may also want to  include a message board for reminders.  7.    Use sticky notes.  Place sticky notes with reminders in a place where the task is performed.  For example: " turn off the  stove" placed by the stove, "lock the door" placed on the door at eye level, " take your medications" on  the bathroom mirror or by the place where you normally take your medications.  8.    Use alarms/timers.  Use while cooking to remind yourself to check on food or as a reminder to take your medicine, or as a  reminder to make a call, or as a reminder to perform another task, etc.

## 2019-08-13 DIAGNOSIS — Z23 Encounter for immunization: Secondary | ICD-10-CM | POA: Diagnosis not present

## 2019-08-16 NOTE — Progress Notes (Signed)
I reviewed note and agree with plan.   VIKRAM R. PENUMALLI, MD 08/16/2019, 11:00 AM Certified in Neurology, Neurophysiology and Neuroimaging  [facility] [address], [address] [place], [place] [address] [phone]  

## 2019-11-05 DIAGNOSIS — Z23 Encounter for immunization: Secondary | ICD-10-CM | POA: Diagnosis not present

## 2019-11-22 DIAGNOSIS — M545 Low back pain: Secondary | ICD-10-CM | POA: Diagnosis not present

## 2019-11-22 DIAGNOSIS — G40209 Localization-related (focal) (partial) symptomatic epilepsy and epileptic syndromes with complex partial seizures, not intractable, without status epilepticus: Secondary | ICD-10-CM | POA: Diagnosis not present

## 2019-11-22 DIAGNOSIS — E039 Hypothyroidism, unspecified: Secondary | ICD-10-CM | POA: Diagnosis not present

## 2019-11-22 DIAGNOSIS — H9193 Unspecified hearing loss, bilateral: Secondary | ICD-10-CM | POA: Diagnosis not present

## 2019-11-22 DIAGNOSIS — G309 Alzheimer's disease, unspecified: Secondary | ICD-10-CM | POA: Diagnosis not present

## 2019-11-22 DIAGNOSIS — Z8679 Personal history of other diseases of the circulatory system: Secondary | ICD-10-CM | POA: Diagnosis not present

## 2019-11-22 DIAGNOSIS — Z1331 Encounter for screening for depression: Secondary | ICD-10-CM | POA: Diagnosis not present

## 2019-11-23 DIAGNOSIS — F0390 Unspecified dementia without behavioral disturbance: Secondary | ICD-10-CM | POA: Diagnosis not present

## 2019-11-23 DIAGNOSIS — R9431 Abnormal electrocardiogram [ECG] [EKG]: Secondary | ICD-10-CM | POA: Diagnosis not present

## 2019-11-23 DIAGNOSIS — G319 Degenerative disease of nervous system, unspecified: Secondary | ICD-10-CM | POA: Diagnosis not present

## 2019-11-23 DIAGNOSIS — R531 Weakness: Secondary | ICD-10-CM | POA: Diagnosis not present

## 2019-11-23 DIAGNOSIS — R5381 Other malaise: Secondary | ICD-10-CM | POA: Diagnosis not present

## 2019-11-23 DIAGNOSIS — U071 COVID-19: Secondary | ICD-10-CM | POA: Diagnosis not present

## 2019-11-23 DIAGNOSIS — R413 Other amnesia: Secondary | ICD-10-CM | POA: Diagnosis not present

## 2019-11-23 DIAGNOSIS — I491 Atrial premature depolarization: Secondary | ICD-10-CM | POA: Diagnosis not present

## 2019-11-25 DIAGNOSIS — E1151 Type 2 diabetes mellitus with diabetic peripheral angiopathy without gangrene: Secondary | ICD-10-CM | POA: Diagnosis not present

## 2019-11-26 DIAGNOSIS — Z20828 Contact with and (suspected) exposure to other viral communicable diseases: Secondary | ICD-10-CM | POA: Diagnosis not present

## 2019-12-01 DIAGNOSIS — Z23 Encounter for immunization: Secondary | ICD-10-CM | POA: Diagnosis not present

## 2020-05-24 DIAGNOSIS — E7849 Other hyperlipidemia: Secondary | ICD-10-CM | POA: Diagnosis not present

## 2020-05-24 DIAGNOSIS — E038 Other specified hypothyroidism: Secondary | ICD-10-CM | POA: Diagnosis not present

## 2020-05-24 DIAGNOSIS — Z125 Encounter for screening for malignant neoplasm of prostate: Secondary | ICD-10-CM | POA: Diagnosis not present

## 2020-05-29 DIAGNOSIS — Z1212 Encounter for screening for malignant neoplasm of rectum: Secondary | ICD-10-CM | POA: Diagnosis not present

## 2020-05-29 DIAGNOSIS — M545 Low back pain: Secondary | ICD-10-CM | POA: Diagnosis not present

## 2020-05-29 DIAGNOSIS — Z0000 Encounter for general adult medical examination without abnormal findings: Secondary | ICD-10-CM | POA: Diagnosis not present

## 2020-05-29 DIAGNOSIS — R2681 Unsteadiness on feet: Secondary | ICD-10-CM | POA: Diagnosis not present

## 2020-05-29 DIAGNOSIS — R82998 Other abnormal findings in urine: Secondary | ICD-10-CM | POA: Diagnosis not present

## 2020-05-29 DIAGNOSIS — G3184 Mild cognitive impairment, so stated: Secondary | ICD-10-CM | POA: Diagnosis not present

## 2020-05-29 DIAGNOSIS — H9193 Unspecified hearing loss, bilateral: Secondary | ICD-10-CM | POA: Diagnosis not present

## 2020-05-29 DIAGNOSIS — G309 Alzheimer's disease, unspecified: Secondary | ICD-10-CM | POA: Diagnosis not present

## 2020-05-29 DIAGNOSIS — E785 Hyperlipidemia, unspecified: Secondary | ICD-10-CM | POA: Diagnosis not present

## 2020-05-29 DIAGNOSIS — G40209 Localization-related (focal) (partial) symptomatic epilepsy and epileptic syndromes with complex partial seizures, not intractable, without status epilepticus: Secondary | ICD-10-CM | POA: Diagnosis not present

## 2020-05-29 DIAGNOSIS — M25571 Pain in right ankle and joints of right foot: Secondary | ICD-10-CM | POA: Diagnosis not present

## 2020-05-29 DIAGNOSIS — K2270 Barrett's esophagus without dysplasia: Secondary | ICD-10-CM | POA: Diagnosis not present

## 2020-05-29 DIAGNOSIS — E039 Hypothyroidism, unspecified: Secondary | ICD-10-CM | POA: Diagnosis not present

## 2020-05-29 DIAGNOSIS — I351 Nonrheumatic aortic (valve) insufficiency: Secondary | ICD-10-CM | POA: Diagnosis not present

## 2020-06-17 IMAGING — DX DG CHEST 2V
2 series · 2 of 2 positions shown · non-contrast
Comparison: 01/13/2013

CLINICAL DATA: Tachycardia.

EXAM:
CHEST - 2 VIEW

[w chest pa]
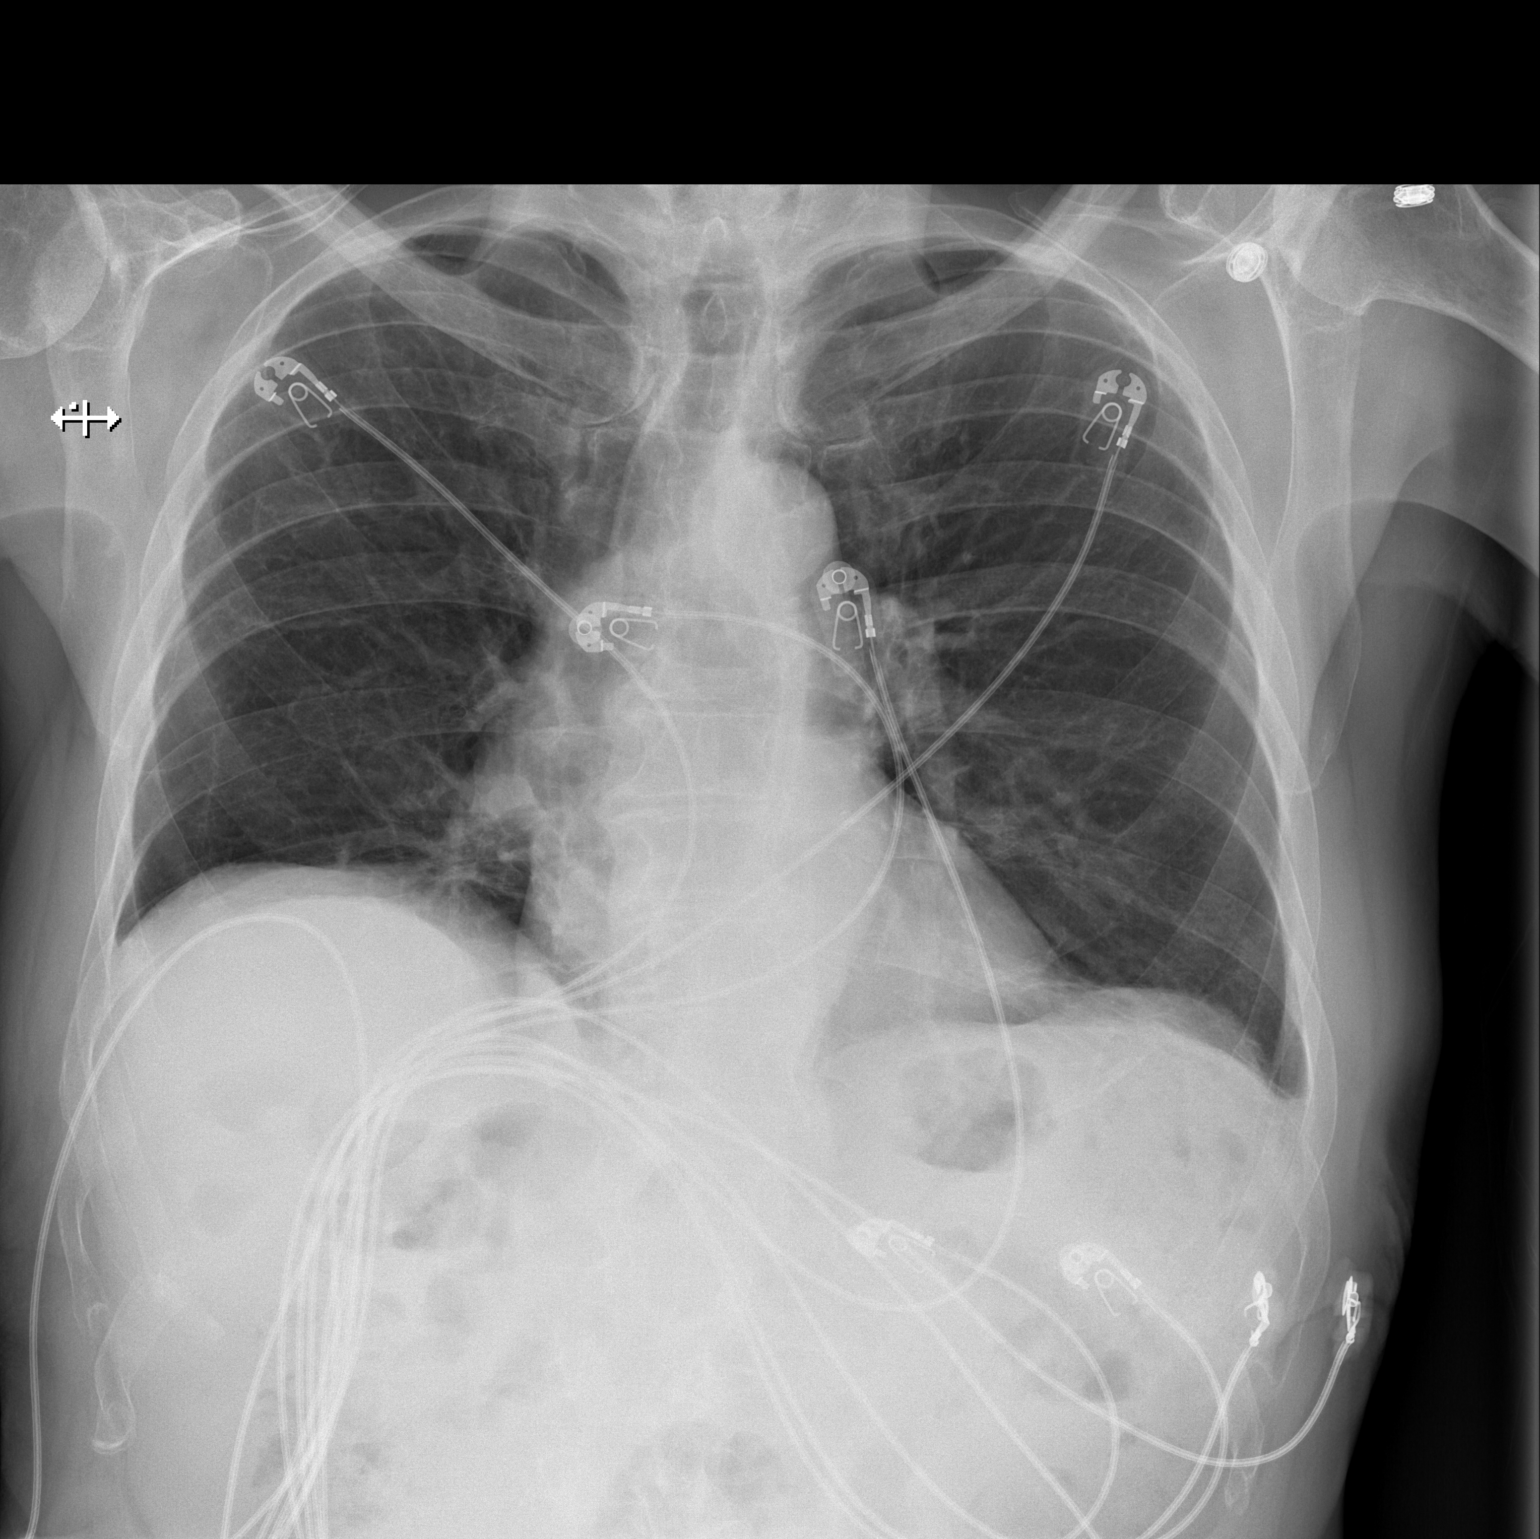

[w chest lat]
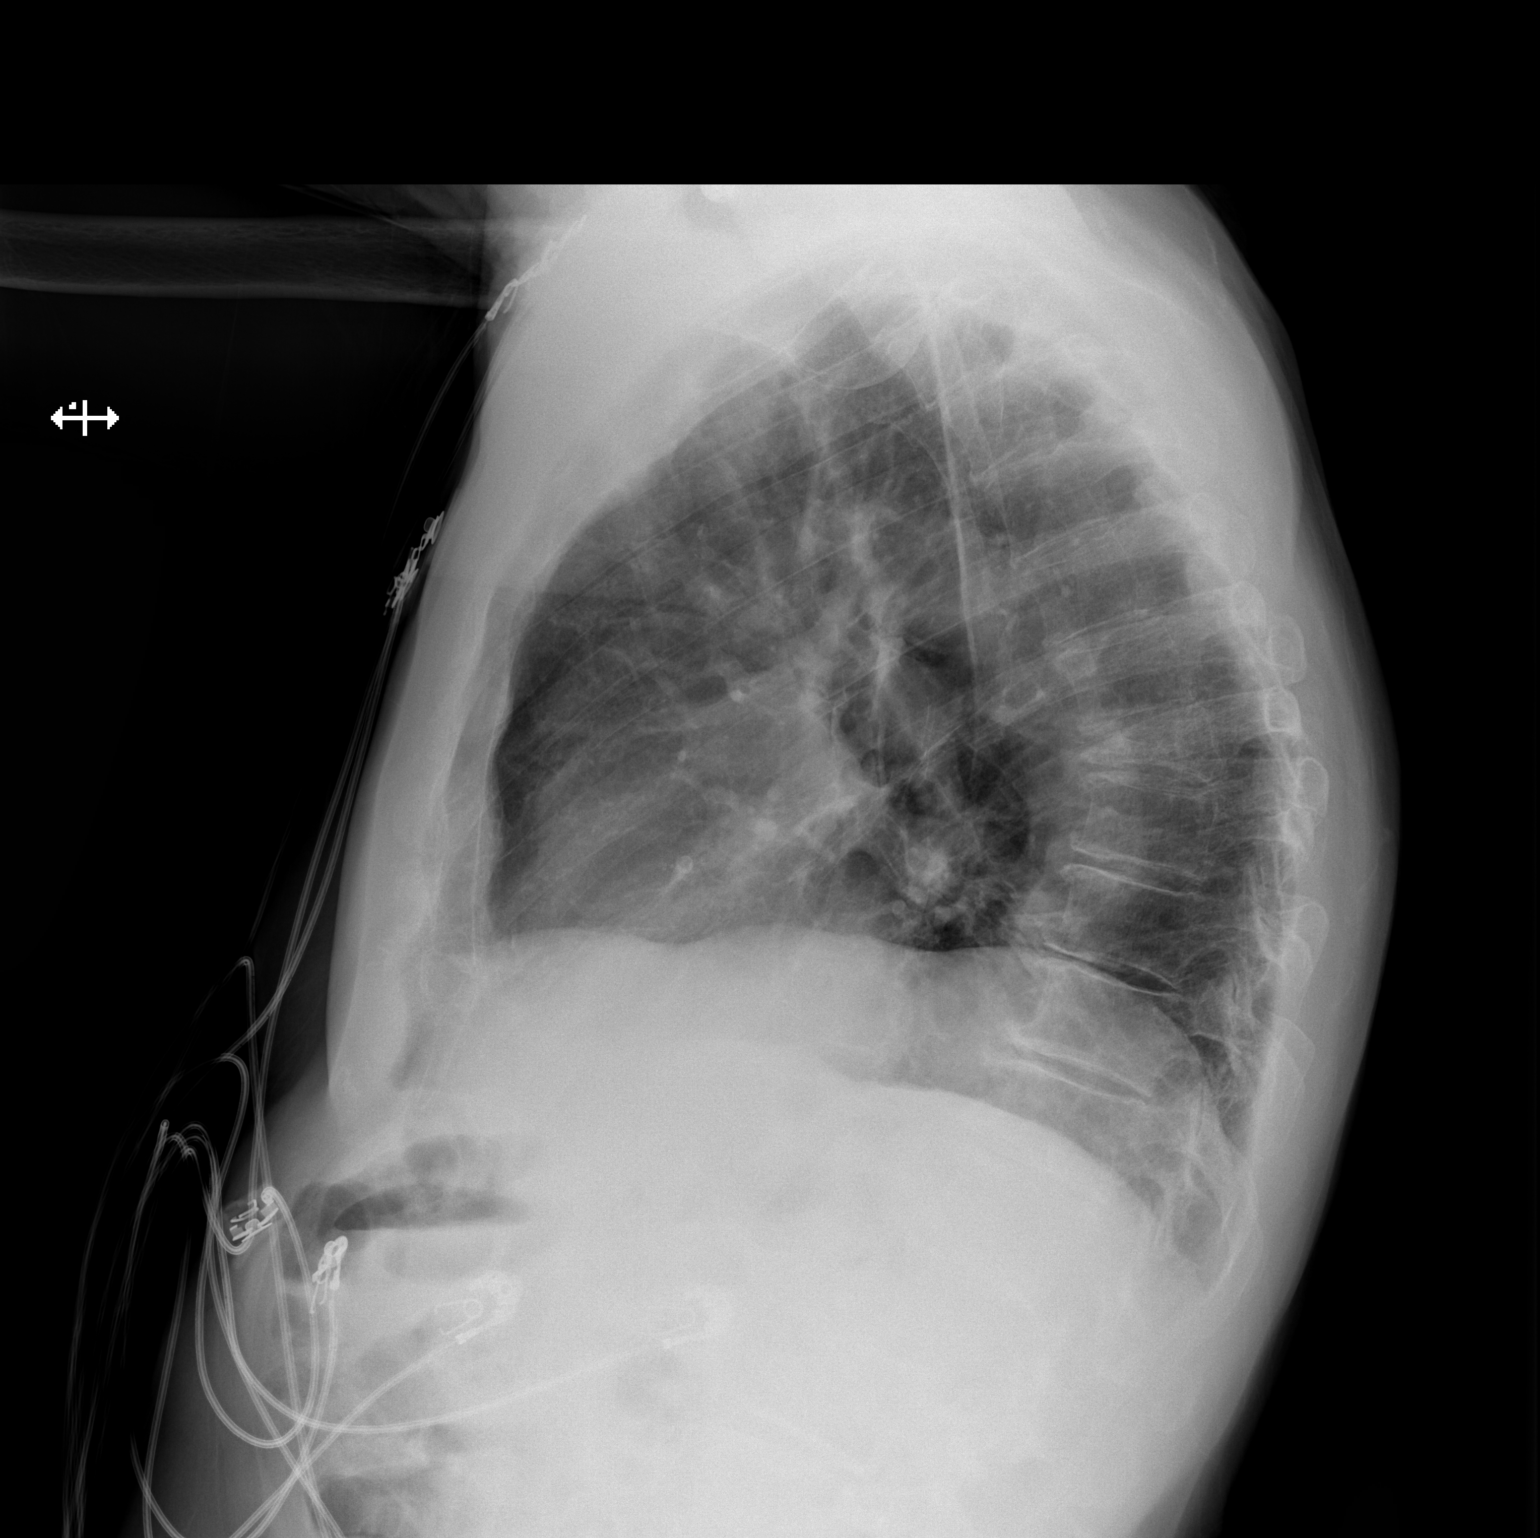

[2 of 2 positions shown; findings below may reference images not displayed]

FINDINGS: The heart size and mediastinal contours are within normal limits.
Both lungs are clear. The visualized skeletal structures are
unremarkable.
IMPRESSION: No active cardiopulmonary disease.

## 2020-07-31 ENCOUNTER — Ambulatory Visit: Payer: Medicare Other | Admitting: Adult Health

## 2020-07-31 ENCOUNTER — Ambulatory Visit: Payer: Medicare Other | Admitting: Family Medicine

## 2020-07-31 ENCOUNTER — Telehealth: Payer: Self-pay | Admitting: *Deleted

## 2020-07-31 NOTE — Progress Notes (Deleted)
PATIENT: [name] DOB: 03-17-1934  REASON FOR VISIT: follow up HISTORY FROM: patient  No chief complaint on file.    HISTORY OF PRESENT ILLNESS: Today 07/31/20 [name] is a 86 y.o. male here today for follow up for MCI. He was evaluated by the ER on 11/23/2019 after getting lost while driving.   HISTORY: (copied from my note on 07/29/2019)  [name] is a 86 y.o. male here today for follow up for amnesic cognitive impairment. He continues donepizil 10mg  daily, memantine 10mg  twice daily and levetiracetam ER 750mg  daily. He is feeling well. He feels memory is stable from last visit. He is tolerating medications without adverse effects. He does continue to drive short distances and does not drive alone. No accidents. He is able to perform all ADL's. He continues daily exercise and plays suduko.   HISTORY: (copied from Dr Gladstone Lighter note on 07/13/2018)  UPDATE (07/13/18, [facility]): Since last visit, doingwell. Symptoms aremild, but progressive. Severityis mild. No alleviating or aggravating factors. Toleratingmeds.Having more problems with driving directions and getting lost.  UPDATE 07/08/17: Since last visit doing well. Memory loss stable. Neuropsych testing reviewed. No seizures.   UPDATE 03/03/17: Since last visit, doing about the same, with some mild worsening of memory loss issues.  UPDATE 02/01/16: Since last visit, doing about the same. No seizures . Memory issues stable.   UPDATE 04/27/15: Since last visit, still c/o memory issues. Mainly names and appts. Tries to write things down and and stay organized. Now living at [facility].   UPDATE 02/02/15: Since last visit, memory prob continue. No sz. More sleepy in daytime.   UPDATE 10/24/14: Since last visit, doing well. No sz. No spells. Memory loss is stable.   UPDATE 04/21/14: Since last visit, was doing well until 12/29/13, at coffee shop with friends, then had a 1 min episode of staring/memory loss. No  convulsions or syncope. He thought this may have been a "mini sz" but he didn't contact our office. He contacted PCP, and was recommended to add LEV 250mg  in AM, in addition to his 500mg  XR tabs in the evening. Memory loss is stable. Still driving. Recently transitioned to retirement community.   UPDATE 05/27/13 (Dr. Rexene Alberts): 86 year old right-handed gentleman who presents for followup consultation of his complex partial seizure disorder. He is accompanied by his wife today. This is his first visit after Dr. Tressia Danas retirement and he was last seen by Dr. Jeneen Rinks love on 12/02/2012, at which time Dr. Erling Cruz did not change his antiseizure medication and felt hesitant to start donepezil for memory loss as it can lower seizure threshold. He suggested brain MRI and lab work including [place] level. His MMSE at that time was 27/30, clock drawing was 4, animal fluency was 9. The patient has an underlying medical history of asthma, esophagitis, thyroid disease, arthritis status post right hip replacement surgery in 2005 and seizure disorder. He is currently on pravastatin, B12, axona, omeprazole, Synthroid, Advair, Keppra, vitamin B6, multivitamin.  PRIOR HPI (12/02/12, Dr. Erling Cruz): 86 year old right-handed white married male with complex partial seizures first diagnosed in 1983 with normal CAT scan and MRI study of the brain but two EEGs showing evidence of left temporal lobe epileptiform activity. He developed side effects on Dilantin which was discontinued in 1986. He had 2 simple partial seizures and Dilantin was reinstituted. Dilantin was changed to Tegretol but he developed lethargy on Tegretol and was tapered off.He had a seizure 05/10/1997 and Tegretol was restarted.He was tried of  Depakote, but was tapered off Depakote at [facility]. In 2006 he began having episodes of strange feeling lasting 20-30 seconds with dj vu and memory loss. During these episodes he would be unresponsive according his wife who would shake him. MRI  06/05/2005 was normal except for mild atrophy and EEG 10/11/2005 was normal. He was placed on Lamictal but did not tolerate this well and continued to have seizures on 300 mg per day. He had a witnessed seizure 08/26/2006 in [place] office. He was slowly changed to Micco and has not had recurrent seizures but has had poor tolerance with sleepiness. He was changed from 500 mg twice per day to 500 mg ER twice daily and subsequently 250 mg levetiracetam 3 times per day. He is now on levetiracetam 500 mg ER once daily . I saw him 06/19/09 for episodes of vague sensation with spinning lasting 20-30 seconds. These would occur sitting, lying, standing, and when turning over in bed. There was no loss of consciousness or aura of macropsia, micropsia, dj vu, strange odors or tastes. His examination was normal with negative Dix-Hallpike hanging head test. He started vertigo exercises and his symptoms resolved. If he does not do his exercises the symptoms recur. He complains of memory loss He has been evaluated 06/2007 for memory loss with a neuropsychological battery showing superior intelligence, but loss of short-term and delayed memory. His mother had memory loss beginning in her 79s. He is able to pay the bills and right the checks.He can't remember what day it is. He has difficulty with peoples names that he has known for years. He takes naps for 15 minutes after breakfast, and one to 2 naps after lunch. He drinks coffee during the day, small amounts because of his Barrett's esophagitis which he states helps his memory. He does not snore at night. He awakens refreshed in the mornings. He is independent in activities of daily. 12/03/2011=(MMSE 26/30. CDT 4/4. AFT 12. [name] Index of independence in activities of daily living 6. Lawton-Brody instrumental activities of daily living scale 7. Neuropsychiatric inventory=0. Geriatric depression scale 2/15. Falls assessment tool score 5.CBC, CMP, lipid profile, and TSH  normal 11/13/11. He has not had recurrent seizures. He denies macropsia, micropsia, strange odors or tastes.He complains of daytime sleepiness and takeslevetiracetam 500 mg ER once at night. He complains of worsening long term memory, particularly noticed after gathering of classmates from his college days. He is performing [email or web address] and an exercise program with improvement in his memory He exercises by doing calisthenics 5 times per week, but injured his right ankle many years ago when he fell off of a tank and has recurrent right ankle pain, uses a brace. 12/02/2012=( MMSE 27/30. CDT 4/4. AFT 9. [name] 6. Lawton-Brody 7. Neuropsychiatric inventory=0. Geriatric depression scale 1/15 Falls assessment tool score 5).   REVIEW OF SYSTEMS: Out of a complete 14 system review of symptoms, the patient complains only of the following symptoms, and all other reviewed systems are negative.  ALLERGIES: Allergies  Allergen Reactions  . Dilantin [Phenytoin Sodium Extended] Other (See Comments)    Reaction not recalled by the patient (??)  . Lamotrigine Other (See Comments)    Drowsiness Drowsiness Drowsiness Drowsiness  Drowsiness  . Phenytoin Sodium Extended Other (See Comments)    Reaction not recalled by the patient (??)  . Sulfa Antibiotics Other (See Comments)    From childhood; reaction not recalled  . Sulfamethoxazole Other (See Comments)    Other Other  . Sulfonamide Derivatives Other (  See Comments)    From childhood; reaction not recalled    HOME MEDICATIONS: Outpatient Medications Prior to Visit  Medication Sig Dispense Refill  . acetaminophen (TYLENOL) 500 MG tablet Take 1,000 mg by mouth every 6 (six) hours as needed for mild pain or headache.     Marland Kitchen atorvastatin (LIPITOR) 40 MG tablet Take 40 mg by mouth daily.    . cromolyn (NASALCROM) 5.2 MG/ACT nasal spray Place 1 spray into both nostrils 2 (two) times daily.    . Cyanocobalamin (VITAMIN B-12 SL) Place 1 tablet under the tongue  daily with breakfast.    . donepezil (ARICEPT) 10 MG tablet Take by mouth.    . finasteride (PROSCAR) 5 MG tablet Take 5 mg by mouth daily.   0  . Levetiracetam 750 MG TB24 Take 1 tablet (750 mg total) by mouth at bedtime. 90 tablet 3  . levothyroxine (SYNTHROID, LEVOTHROID) 100 MCG tablet Take 100 mcg by mouth daily before breakfast. Takes every day except sunday    . loperamide (IMODIUM A-D) 2 MG tablet Take 2 mg by mouth daily as needed for diarrhea or loose stools.    . memantine (NAMENDA) 5 MG tablet Take 10 mg by mouth 2 (two) times daily.    . Multiple Vitamin (MULTIVITAMIN) tablet Take 1 tablet by mouth daily.      Marland Kitchen omeprazole (PRILOSEC) 20 MG capsule Take 20 mg by mouth at bedtime.     . pravastatin (PRAVACHOL) 20 MG tablet Take 20 mg by mouth at bedtime.    . predniSONE (DELTASONE) 10 MG tablet Take 10 mg by mouth daily with breakfast.    . traMADol (ULTRAM) 50 MG tablet Take 50 mg by mouth every 6 (six) hours as needed (for pain).     No facility-administered medications prior to visit.    PAST MEDICAL HISTORY: Past Medical History:  Diagnosis Date  . Acute bronchitis 09/05/2009   Qualifier: Diagnosis of  By: Annamaria Boots MD, Clinton D   . Allergic rhinitis   . ALLERGIC RHINITIS 09/26/2007   Qualifier: Diagnosis of  By: June Leap    . Asthma   . Asthma with bronchitis 09/26/2007   Qualifier: Diagnosis of  By: June Leap    . Barrett syndrome   . Depressive disorder, not elsewhere classified   . Dizziness and giddiness   . Esophageal reflux   . 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 Memory loss   . Other and unspecified hyperlipidemia   . Pain in right ankle and joints of right foot 03/03/2017  . Post-traumatic osteoarthritis, right ankle and foot 03/03/2017  . SEIZURE DISORDER 09/26/2007   Qualifier: Diagnosis of  By: June Leap    . Seizure disorder ([place])   . SVT  (supraventricular tachycardia) ([place]) 04/27/2018  . Unspecified hypothyroidism     PAST SURGICAL HISTORY: Past Surgical History:  Procedure Laterality Date  . APPENDECTOMY    . right hip replacement  2005  . SVT ABLATION N/A 04/27/2018   Procedure: SVT ABLATION;  Surgeon: Evans Lance, MD;  Location: [facility];  Service: Cardiovascular;  Laterality: N/A;    FAMILY HISTORY: Family History  Problem Relation Age of Onset  . Heart disease Mother   . Memory loss Mother   . Asthma Sister   . Parkinson's disease Sister     SOCIAL HISTORY: Social History   Socioeconomic History  . Marital status: Married  Spouse name: [name]  . Number of children: 2  . Years of education: BA  . Highest education level: Not on file  Occupational History  . 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 Smoking status: Never Smoker  . Smokeless tobacco: Never Used  Vaping Use  . Vaping Use: Never used  Substance and Sexual Activity  . Alcohol use: No    Alcohol/week: 0.0 standard drinks  . Drug use: No  . Sexual activity: Not on file  Other Topics Concern  . Not on file  Social History Narrative   Patient lives at home with family. ([place])   Caffeine Use: up to 3 cups daily   Social Determinants of Health   Financial Resource Strain:   . Difficulty of Paying Living Expenses: Not on file  Food Insecurity:   . Worried About Charity fundraiser in the Last Year: Not on file  . Ran Out of Food in the Last Year: Not on file  Transportation Needs:   . Lack of Transportation (Medical): Not on file  . Lack of Transportation (Non-Medical): Not on file  Physical Activity:   . Days of Exercise per Week: Not on file  . Minutes of Exercise per Session: Not on file  Stress:   . Feeling of Stress : Not on file  Social Connections:   . Frequency of Communication with Friends and Family: Not on file  . Frequency of Social Gatherings with Friends and  Family: Not on file  . Attends Religious Services: Not on file  . Active Member of Clubs or Organizations: Not on file  . Attends Archivist Meetings: Not on file  . Marital Status: Not on file  Intimate Partner Violence:   . Fear of Current or Ex-Partner: Not on file  . Emotionally Abused: Not on file  . Physically Abused: Not on file  . Sexually Abused: Not on file      PHYSICAL EXAM  There were no vitals filed for this visit. There is no height or weight on file to calculate BMI.  Generalized: Well developed, in no acute distress  Cardiology: normal rate and rhythm, no murmur noted Respiratory: clear to auscultation bilaterally  Neurological examination  Mentation: Alert oriented to time, place, history taking. Follows all commands speech and language fluent Cranial nerve II-XII: Pupils were equal round reactive to light. Extraocular movements were full, visual field were full on confrontational test. Facial sensation and strength were normal. Uvula tongue midline. Head turning and shoulder shrug  were normal and symmetric. Motor: The motor testing reveals 5 over 5 strength of all 4 extremities. Good symmetric motor tone is noted throughout.  Sensory: Sensory testing is intact to soft touch on all 4 extremities. No evidence of extinction is noted.  Coordination: Cerebellar testing reveals good finger-nose-finger and heel-to-shin bilaterally.  Gait and station: Gait is normal. Tandem gait is normal. Romberg is negative. No drift is seen.  Reflexes: Deep tendon reflexes are symmetric and normal bilaterally.   DIAGNOSTIC DATA (LABS, IMAGING, TESTING) - I reviewed patient records, labs, notes, testing and imaging myself where available.  MMSE - Mini Mental State Exam 07/29/2019 07/13/2018 07/08/2017  Orientation to time 0 4 4  Orientation to Place 5 4 3   Registration 3 3 3   Attention/ Calculation 5 5 4   Recall 0 0 0  Language- name 2 objects 2 2 2   Language- repeat 1 1  1   Language- follow 3 step  command 3 3 3   Language- read & follow direction 1 1 1   Write a sentence 1 1 1   Write a sentence-comments drew clock 10 plast 11 - -  Copy design 1 1 1   Copy design-comments named 5 animals - -  Total score 22 25 23      Lab Results  Component Value Date   WBC 3.9 (L) 04/20/2018   HGB 12.5 (L) 04/20/2018   HCT 39.4 04/20/2018   MCV 96.1 04/20/2018   PLT 129 (L) 04/20/2018      Component Value Date/Time   NA 138 04/20/2018 2155   K 4.1 04/20/2018 2155   CL 104 04/20/2018 2155   CO2 27 04/20/2018 2155   GLUCOSE 118 (H) 04/20/2018 2155   BUN 9 04/20/2018 2155   CREATININE 0.76 04/20/2018 2155   CALCIUM 8.6 (L) 04/20/2018 2155   GFRNONAA >60 04/20/2018 2155   GFRAA >60 04/20/2018 2155   No results found for: CHOL, HDL, LDLCALC, LDLDIRECT, TRIG, CHOLHDL No results found for: HGBA1C No results found for: VITAMINB12 No results found for: TSH     ASSESSMENT AND PLAN 86 y.o. year old male  has a past medical history of Acute bronchitis (09/05/2009), Allergic rhinitis, ALLERGIC RHINITIS (09/26/2007), Asthma, Asthma with bronchitis (09/26/2007), Barrett syndrome, Depressive disorder, not elsewhere classified, Dizziness and giddiness, Esophageal reflux, Localization-related (focal) (partial) epilepsy and epileptic syndromes with complex partial seizures, with intractable epilepsy, Memory loss, Other and unspecified hyperlipidemia, Pain in right ankle and joints of right foot (03/03/2017), Post-traumatic osteoarthritis, right ankle and foot (03/03/2017), SEIZURE DISORDER (09/26/2007), Seizure disorder ([place]), SVT (supraventricular tachycardia) ([place]) (04/27/2018), and Unspecified hypothyroidism. here with ***  No diagnosis found.     No orders of the defined types were placed in this encounter.    No orders of the defined types were placed in this encounter.     I spent 15 minutes with the patient. 50% of this time was spent counseling and educating patient on  plan of care and medications.    Debbora Presto, FNP-C 07/31/2020, 7:27 AM [facility] [address] [place], [place] [address] [phone]

## 2020-07-31 NOTE — Telephone Encounter (Signed)
I called and LMVM for pt / wife at home # to see if possible to come in at 1230 today (arrive 1200) for appt.  Tried mobile and no one there by that name.  I tried [facility] wife mobile ([facility]? VM).  I did LM re: appt. To return call.

## 2020-08-02 NOTE — Telephone Encounter (Signed)
Spoke to wife then pt.  Having issues with plumbing in there home.  Rescheduled for 08-03-20.

## 2020-08-03 ENCOUNTER — Ambulatory Visit: Payer: Self-pay | Admitting: Family Medicine

## 2020-08-03 ENCOUNTER — Encounter: Payer: Self-pay | Admitting: Family Medicine

## 2020-08-03 NOTE — Progress Notes (Deleted)
PATIENT: [name] DOB: [birth year]  REASON FOR VISIT: follow up HISTORY FROM: patient  HISTORY OF PRESENT ILLNESS: Today 08/03/20  [name] is a 86 y.o. 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 He continues donepezil 10mg  daily, memantine 10mg  BID and levertiracetam ER 750mg  daily. He was found parked in his car and disoriented in 11/2019. He was evaluated by the ER and discharged home.    HISTORY (copied from my note on 07/29/2019)  [name] is a 86 y.o. male here today for follow up for amnesic cognitive impairment. He continues donepizil 10mg  daily, memantine 10mg  twice daily and levetiracetam ER 750mg  daily. He is feeling well. He feels memory is stable from last visit. He is tolerating medications without adverse effects. He does continue to drive short distances and does not drive alone. No accidents. He is able to perform all ADL's. He continues daily exercise and plays suduko.   HISTORY: (copied from Dr Gladstone Lighter note on 07/13/2018)  UPDATE (07/13/18, [facility]): Since last visit, doingwell. Symptoms aremild, but progressive. Severityis mild. No alleviating or aggravating factors. Toleratingmeds.Having more problems with driving directions and getting lost.  UPDATE 07/08/17: Since last visit doing well. Memory loss stable. Neuropsych testing reviewed. No seizures.   UPDATE 03/03/17: Since last visit, doing about the same, with some mild worsening of memory loss issues.  UPDATE 02/01/16: Since last visit, doing about the same. No seizures . Memory issues stable.   UPDATE 04/27/15: Since last visit, still c/o memory issues. Mainly names and appts. Tries to write things down and and stay organized. Now living at [facility].   UPDATE 02/02/15: Since last visit, memory prob continue. No sz. More sleepy in daytime.   UPDATE 10/24/14: Since last visit, doing well. No sz. No spells. Memory loss is stable.    UPDATE 04/21/14: Since last visit, was doing well until 12/29/13, at coffee shop with friends, then had a 1 min episode of staring/memory loss. No convulsions or syncope. He thought this may have been a "mini sz" but he didn't contact our office. He contacted PCP, and was recommended to add LEV 250mg  in AM, in addition to his 500mg  XR tabs in the evening. Memory loss is stable. Still driving. Recently transitioned to retirement community.   UPDATE 05/27/13 (Dr. Rexene Alberts): 86 year old right-handed gentleman who presents for followup consultation of his complex partial seizure disorder. He is accompanied by his wife today. This is his first visit after Dr. Tressia Danas retirement and he was last seen by Dr. Jeneen Rinks love on 12/02/2012, at which time Dr. Erling Cruz did not change his antiseizure medication and felt hesitant to start donepezil for memory loss as it can lower seizure threshold. He suggested brain MRI and lab work including [place] level. His MMSE at that time was 27/30, clock drawing was 4, animal fluency was 9. The patient has an underlying medical history of asthma, esophagitis, thyroid disease, arthritis status post right hip replacement surgery in 2005 and seizure disorder. He is currently on pravastatin, B12, axona, omeprazole, Synthroid, Advair, Keppra, vitamin B6, multivitamin.  PRIOR HPI (12/02/12, Dr. Erling Cruz): 86 year old right-handed white married male with complex partial seizures first diagnosed in 1983 with normal CAT scan and MRI study of the brain but two EEGs showing evidence of left temporal lobe epileptiform activity. He developed side effects on Dilantin which was discontinued in 1986. He had 2 simple partial seizures and Dilantin was reinstituted. Dilantin was changed to Tegretol  but he developed lethargy on Tegretol and was tapered off.He had a seizure 05/10/1997 and Tegretol was restarted.He was tried of Depakote, but was tapered off Depakote at [facility]. In 2006 he began having episodes of strange  feeling lasting 20-30 seconds with dj vu and memory loss. During these episodes he would be unresponsive according his wife who would shake him. MRI 06/05/2005 was normal except for mild atrophy and EEG 10/11/2005 was normal. He was placed on Lamictal but did not tolerate this well and continued to have seizures on 300 mg per day. He had a witnessed seizure 08/26/2006 in [place] office. He was slowly changed to [place] and has not had recurrent seizures but has had poor tolerance with sleepiness. He was changed from 500 mg twice per day to 500 mg ER twice daily and subsequently 250 mg levetiracetam 3 times per day. He is now on levetiracetam 500 mg ER once daily . I saw him 06/19/09 for episodes of vague sensation with spinning lasting 20-30 seconds. These would occur sitting, lying, standing, and when turning over in bed. There was no loss of consciousness or aura of macropsia, micropsia, dj vu, strange odors or tastes. His examination was normal with negative Dix-Hallpike hanging head test. He started vertigo exercises and his symptoms resolved. If he does not do his exercises the symptoms recur. He complains of memory loss He has been evaluated 06/2007 for memory loss with a neuropsychological battery showing superior intelligence, but loss of short-term and delayed memory. His mother had memory loss beginning in her 83s. He is able to pay the bills and right the checks.He can't remember what day it is. He has difficulty with peoples names that he has known for years. He takes naps for 15 minutes after breakfast, and one to 2 naps after lunch. He drinks coffee during the day, small amounts because of his Barrett's esophagitis which he states helps his memory. He does not snore at night. He awakens refreshed in the mornings. He is independent in activities of daily. 12/03/2011=(MMSE 26/30. CDT 4/4. AFT 12. [name] Index of independence in activities of daily living 6. Lawton-Brody instrumental activities  of daily living scale 7. Neuropsychiatric inventory=0. Geriatric depression scale 2/15. Falls assessment tool score 5.CBC, CMP, lipid profile, and TSH normal 11/13/11. He has not had recurrent seizures. He denies macropsia, micropsia, strange odors or tastes.He complains of daytime sleepiness and takeslevetiracetam 500 mg ER once at night. He complains of worsening long term memory, particularly noticed after gathering of classmates from his college days. He is performing [email or web address] and an exercise program with improvement in his memory He exercises by doing calisthenics 5 times per week, but injured his right ankle many years ago when he fell off of a tank and has recurrent right ankle pain, uses a brace. 12/02/2012=( MMSE 27/30. CDT 4/4. AFT 9. [name] 6. Lawton-Brody 7. Neuropsychiatric inventory=0. Geriatric depression scale 1/15 Falls assessment tool score 5).      REVIEW OF SYSTEMS: Out of a complete 14 system review of symptoms, the patient complains only of the following symptoms, and all other reviewed systems are negative.   ALLERGIES: Allergies  Allergen Reactions  . Dilantin [Phenytoin Sodium Extended] Other (See Comments)    Reaction not recalled by the patient (??)  . Lamotrigine Other (See Comments)    Drowsiness Drowsiness Drowsiness Drowsiness  Drowsiness  . Phenytoin Sodium Extended Other (See Comments)    Reaction not recalled by the patient (??)  . Sulfa Antibiotics Other (See  Comments)    From childhood; reaction not recalled  . Sulfamethoxazole Other (See Comments)    Other Other  . Sulfonamide Derivatives Other (See Comments)    From childhood; reaction not recalled     HOME MEDICATIONS: Outpatient Medications Prior to Visit  Medication Sig Dispense Refill  . acetaminophen (TYLENOL) 500 MG tablet Take 1,000 mg by mouth every 6 (six) hours as needed for mild pain or headache.     Marland Kitchen atorvastatin (LIPITOR) 40 MG tablet Take 40 mg by mouth daily.    .  cromolyn (NASALCROM) 5.2 MG/ACT nasal spray Place 1 spray into both nostrils 2 (two) times daily.    . Cyanocobalamin (VITAMIN B-12 SL) Place 1 tablet under the tongue daily with breakfast.    . donepezil (ARICEPT) 10 MG tablet Take by mouth.    . finasteride (PROSCAR) 5 MG tablet Take 5 mg by mouth daily.   0  . Levetiracetam 750 MG TB24 Take 1 tablet (750 mg total) by mouth at bedtime. 90 tablet 3  . levothyroxine (SYNTHROID, LEVOTHROID) 100 MCG tablet Take 100 mcg by mouth daily before breakfast. Takes every day except sunday    . loperamide (IMODIUM A-D) 2 MG tablet Take 2 mg by mouth daily as needed for diarrhea or loose stools.    . memantine (NAMENDA) 5 MG tablet Take 10 mg by mouth 2 (two) times daily.    . Multiple Vitamin (MULTIVITAMIN) tablet Take 1 tablet by mouth daily.      Marland Kitchen omeprazole (PRILOSEC) 20 MG capsule Take 20 mg by mouth at bedtime.     . pravastatin (PRAVACHOL) 20 MG tablet Take 20 mg by mouth at bedtime.    . predniSONE (DELTASONE) 10 MG tablet Take 10 mg by mouth daily with breakfast.    . traMADol (ULTRAM) 50 MG tablet Take 50 mg by mouth every 6 (six) hours as needed (for pain).     No facility-administered medications prior to visit.     PAST MEDICAL HISTORY: Past Medical History:  Diagnosis Date  . Acute bronchitis 09/05/2009   Qualifier: Diagnosis of  By: Annamaria Boots MD, Clinton D   . Allergic rhinitis   . ALLERGIC RHINITIS 09/26/2007   Qualifier: Diagnosis of  By: June Leap    . Asthma   . Asthma with bronchitis 09/26/2007   Qualifier: Diagnosis of  By: June Leap    . Barrett syndrome   . Depressive disorder, not elsewhere classified   . Dizziness and giddiness   . Esophageal reflux   . 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 Memory loss   . Other and unspecified hyperlipidemia   . Pain in right ankle and joints of right foot 03/03/2017  . Post-traumatic  osteoarthritis, right ankle and foot 03/03/2017  . SEIZURE DISORDER 09/26/2007   Qualifier: Diagnosis of  By: June Leap    . Seizure disorder ([place])   . SVT (supraventricular tachycardia) ([place]) 04/27/2018  . Unspecified hypothyroidism      PAST SURGICAL HISTORY: Past Surgical History:  Procedure Laterality Date  . APPENDECTOMY    . right hip replacement  2005  . SVT ABLATION N/A 04/27/2018   Procedure: SVT ABLATION;  Surgeon: Evans Lance, MD;  Location: [facility];  Service: Cardiovascular;  Laterality: N/A;     FAMILY HISTORY: Family History  Problem Relation Age of Onset  . Heart disease Mother   . Memory loss Mother   .  Asthma Sister   . Parkinson's disease Sister      SOCIAL HISTORY: Social History   Socioeconomic History  . Marital status: Married    Spouse name: Eugenia  . Number of children: 2  . Years of education: BA  . Highest education level: Not on file  Occupational History  . 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 Smoking status: Never Smoker  . Smokeless tobacco: Never Used  Vaping Use  . Vaping Use: Never used  Substance and Sexual Activity  . Alcohol use: No    Alcohol/week: 0.0 standard drinks  . Drug use: No  . Sexual activity: Not on file  Other Topics Concern  . Not on file  Social History Narrative   Patient lives at home with family. ([place])   Caffeine Use: up to 3 cups daily   Social Determinants of Health   Financial Resource Strain:   . Difficulty of Paying Living Expenses: Not on file  Food Insecurity:   . Worried About Charity fundraiser in the Last Year: Not on file  . Ran Out of Food in the Last Year: Not on file  Transportation Needs:   . Lack of Transportation (Medical): Not on file  . Lack of Transportation (Non-Medical): Not on file  Physical Activity:   . Days of Exercise per Week: Not on file  . Minutes of Exercise per Session: Not on file  Stress:   . Feeling of  Stress : Not on file  Social Connections:   . Frequency of Communication with Friends and Family: Not on file  . Frequency of Social Gatherings with Friends and Family: Not on file  . Attends Religious Services: Not on file  . Active Member of Clubs or Organizations: Not on file  . Attends Archivist Meetings: Not on file  . Marital Status: Not on file  Intimate Partner Violence:   . Fear of Current or Ex-Partner: Not on file  . Emotionally Abused: Not on file  . Physically Abused: Not on file  . Sexually Abused: Not on file      PHYSICAL EXAM  There were no vitals filed for this visit. There is no height or weight on file to calculate BMI.   Generalized: Well developed, in no acute distress   Neurological examination  Mentation: Alert oriented to time, place, history taking. Follows all commands speech and language fluent Cranial nerve II-XII: Pupils were equal round reactive to light. Extraocular movements were full, visual field were full on confrontational test. Facial sensation and strength were normal. Uvula tongue midline. Head turning and shoulder shrug  were normal and symmetric. Motor: The motor testing reveals 5 over 5 strength of all 4 extremities. Good symmetric motor tone is noted throughout.  Sensory: Sensory testing is intact to soft touch on all 4 extremities. No evidence of extinction is noted.  Coordination: Cerebellar testing reveals good finger-nose-finger and heel-to-shin bilaterally.  Gait and station: Gait is normal. Tandem gait is normal. Romberg is negative. No drift is seen.  Reflexes: Deep tendon reflexes are symmetric and normal bilaterally.     DIAGNOSTIC DATA (LABS, IMAGING, TESTING) - I reviewed patient records, labs, notes, testing and imaging myself where available.  Lab Results  Component Value Date   WBC 3.9 (L) 04/20/2018   HGB 12.5 (L) 04/20/2018   HCT 39.4 04/20/2018   MCV 96.1 04/20/2018   PLT 129 (L) 04/20/2018  Component Value Date/Time   NA 138 04/20/2018 2155   K 4.1 04/20/2018 2155   CL 104 04/20/2018 2155   CO2 27 04/20/2018 2155   GLUCOSE 118 (H) 04/20/2018 2155   BUN 9 04/20/2018 2155   CREATININE 0.76 04/20/2018 2155   CALCIUM 8.6 (L) 04/20/2018 2155   GFRNONAA >60 04/20/2018 2155   GFRAA >60 04/20/2018 2155   No results found for: CHOL, HDL, LDLCALC, LDLDIRECT, TRIG, CHOLHDL No results found for: HGBA1C No results found for: VITAMINB12 No results found for: TSH    ASSESSMENT AND PLAN  86 y.o. year old male  has a past medical history of Acute bronchitis (09/05/2009), Allergic rhinitis, ALLERGIC RHINITIS (09/26/2007), Asthma, Asthma with bronchitis (09/26/2007), Barrett syndrome, Depressive disorder, not elsewhere classified, Dizziness and giddiness, Esophageal reflux, Localization-related (focal) (partial) epilepsy and epileptic syndromes with complex partial seizures, with intractable epilepsy, Memory loss, Other and unspecified hyperlipidemia, Pain in right ankle and joints of right foot (03/03/2017), Post-traumatic osteoarthritis, right ankle and foot (03/03/2017), SEIZURE DISORDER (09/26/2007), Seizure disorder ([place]), SVT (supraventricular tachycardia) ([place]) (04/27/2018), and Unspecified hypothyroidism. here with ***  Amnestic MCI (mild cognitive impairment with memory loss)  Partial symptomatic epilepsy with complex partial seizures, not intractable, without status epilepticus ([place])   I spent 20 minutes of face-to-face and non-face-to-face time with patient.  This included previsit chart review, lab review, study review, order entry, electronic health record documentation, patient education.    Debbora Presto, MSN, FNP-C 08/03/2020, 7:34 AM  [facility] [address], [place], [place] [address] [phone]

## 2020-08-07 ENCOUNTER — Telehealth: Payer: Self-pay | Admitting: Family Medicine

## 2020-08-07 ENCOUNTER — Other Ambulatory Visit: Payer: Self-pay | Admitting: *Deleted

## 2020-08-07 MED ORDER — LEVETIRACETAM ER 750 MG PO TB24: 750.0000 mg | ORAL_TABLET | Freq: Every day | ORAL | 0 refills | Status: DC

## 2020-08-07 NOTE — Telephone Encounter (Signed)
Pt needs appt, no showed 08-03-20. Refilled levetiracetam 750mg  po qhs, #90.

## 2020-08-07 NOTE — Telephone Encounter (Signed)
[facility]) called, Attempt to get new prescription for Levetiracetam 750 MG TB24. Received a response from [facility]; denied due to "Pt never under subscriber's care. Would like a call from the nurse.

## 2020-11-16 DIAGNOSIS — F0280 Dementia in other diseases classified elsewhere without behavioral disturbance: Secondary | ICD-10-CM | POA: Diagnosis not present

## 2020-11-16 DIAGNOSIS — E639 Nutritional deficiency, unspecified: Secondary | ICD-10-CM | POA: Diagnosis not present

## 2020-11-16 DIAGNOSIS — G40909 Epilepsy, unspecified, not intractable, without status epilepticus: Secondary | ICD-10-CM | POA: Diagnosis not present

## 2020-11-16 DIAGNOSIS — E785 Hyperlipidemia, unspecified: Secondary | ICD-10-CM | POA: Diagnosis not present

## 2020-11-16 DIAGNOSIS — E039 Hypothyroidism, unspecified: Secondary | ICD-10-CM | POA: Diagnosis not present

## 2020-11-16 DIAGNOSIS — G309 Alzheimer's disease, unspecified: Secondary | ICD-10-CM | POA: Diagnosis not present

## 2020-11-16 DIAGNOSIS — E559 Vitamin D deficiency, unspecified: Secondary | ICD-10-CM | POA: Diagnosis not present

## 2020-11-17 DIAGNOSIS — F0390 Unspecified dementia without behavioral disturbance: Secondary | ICD-10-CM | POA: Diagnosis not present

## 2020-11-17 DIAGNOSIS — I471 Supraventricular tachycardia: Secondary | ICD-10-CM | POA: Diagnosis not present

## 2020-11-17 DIAGNOSIS — E039 Hypothyroidism, unspecified: Secondary | ICD-10-CM | POA: Diagnosis not present

## 2020-11-17 DIAGNOSIS — R569 Unspecified convulsions: Secondary | ICD-10-CM | POA: Diagnosis not present

## 2020-11-23 DIAGNOSIS — R41841 Cognitive communication deficit: Secondary | ICD-10-CM | POA: Diagnosis not present

## 2020-11-23 DIAGNOSIS — G309 Alzheimer's disease, unspecified: Secondary | ICD-10-CM | POA: Diagnosis not present

## 2020-11-23 DIAGNOSIS — F0280 Dementia in other diseases classified elsewhere without behavioral disturbance: Secondary | ICD-10-CM | POA: Diagnosis not present

## 2020-11-24 DIAGNOSIS — R41841 Cognitive communication deficit: Secondary | ICD-10-CM | POA: Diagnosis not present

## 2020-11-24 DIAGNOSIS — G309 Alzheimer's disease, unspecified: Secondary | ICD-10-CM | POA: Diagnosis not present

## 2020-11-24 DIAGNOSIS — F0280 Dementia in other diseases classified elsewhere without behavioral disturbance: Secondary | ICD-10-CM | POA: Diagnosis not present

## 2020-11-27 DIAGNOSIS — F0280 Dementia in other diseases classified elsewhere without behavioral disturbance: Secondary | ICD-10-CM | POA: Diagnosis not present

## 2020-11-27 DIAGNOSIS — G309 Alzheimer's disease, unspecified: Secondary | ICD-10-CM | POA: Diagnosis not present

## 2020-11-27 DIAGNOSIS — R41841 Cognitive communication deficit: Secondary | ICD-10-CM | POA: Diagnosis not present

## 2020-11-28 DIAGNOSIS — G309 Alzheimer's disease, unspecified: Secondary | ICD-10-CM | POA: Diagnosis not present

## 2020-11-28 DIAGNOSIS — R41841 Cognitive communication deficit: Secondary | ICD-10-CM | POA: Diagnosis not present

## 2020-11-28 DIAGNOSIS — F0280 Dementia in other diseases classified elsewhere without behavioral disturbance: Secondary | ICD-10-CM | POA: Diagnosis not present

## 2020-11-29 DIAGNOSIS — F0280 Dementia in other diseases classified elsewhere without behavioral disturbance: Secondary | ICD-10-CM | POA: Diagnosis not present

## 2020-11-29 DIAGNOSIS — R41841 Cognitive communication deficit: Secondary | ICD-10-CM | POA: Diagnosis not present

## 2020-11-29 DIAGNOSIS — G309 Alzheimer's disease, unspecified: Secondary | ICD-10-CM | POA: Diagnosis not present

## 2020-11-30 DIAGNOSIS — F0280 Dementia in other diseases classified elsewhere without behavioral disturbance: Secondary | ICD-10-CM | POA: Diagnosis not present

## 2020-11-30 DIAGNOSIS — G309 Alzheimer's disease, unspecified: Secondary | ICD-10-CM | POA: Diagnosis not present

## 2020-11-30 DIAGNOSIS — R41841 Cognitive communication deficit: Secondary | ICD-10-CM | POA: Diagnosis not present

## 2020-12-01 DIAGNOSIS — R41841 Cognitive communication deficit: Secondary | ICD-10-CM | POA: Diagnosis not present

## 2020-12-01 DIAGNOSIS — F0280 Dementia in other diseases classified elsewhere without behavioral disturbance: Secondary | ICD-10-CM | POA: Diagnosis not present

## 2020-12-01 DIAGNOSIS — G309 Alzheimer's disease, unspecified: Secondary | ICD-10-CM | POA: Diagnosis not present

## 2020-12-04 DIAGNOSIS — F0280 Dementia in other diseases classified elsewhere without behavioral disturbance: Secondary | ICD-10-CM | POA: Diagnosis not present

## 2020-12-04 DIAGNOSIS — G309 Alzheimer's disease, unspecified: Secondary | ICD-10-CM | POA: Diagnosis not present

## 2020-12-04 DIAGNOSIS — R41841 Cognitive communication deficit: Secondary | ICD-10-CM | POA: Diagnosis not present

## 2020-12-06 DIAGNOSIS — R2681 Unsteadiness on feet: Secondary | ICD-10-CM | POA: Diagnosis not present

## 2020-12-06 DIAGNOSIS — G309 Alzheimer's disease, unspecified: Secondary | ICD-10-CM | POA: Diagnosis not present

## 2020-12-06 DIAGNOSIS — F0280 Dementia in other diseases classified elsewhere without behavioral disturbance: Secondary | ICD-10-CM | POA: Diagnosis not present

## 2020-12-06 DIAGNOSIS — R41841 Cognitive communication deficit: Secondary | ICD-10-CM | POA: Diagnosis not present

## 2020-12-07 DIAGNOSIS — F0280 Dementia in other diseases classified elsewhere without behavioral disturbance: Secondary | ICD-10-CM | POA: Diagnosis not present

## 2020-12-07 DIAGNOSIS — R2681 Unsteadiness on feet: Secondary | ICD-10-CM | POA: Diagnosis not present

## 2020-12-07 DIAGNOSIS — G309 Alzheimer's disease, unspecified: Secondary | ICD-10-CM | POA: Diagnosis not present

## 2020-12-07 DIAGNOSIS — R41841 Cognitive communication deficit: Secondary | ICD-10-CM | POA: Diagnosis not present

## 2020-12-11 DIAGNOSIS — G309 Alzheimer's disease, unspecified: Secondary | ICD-10-CM | POA: Diagnosis not present

## 2020-12-11 DIAGNOSIS — F0280 Dementia in other diseases classified elsewhere without behavioral disturbance: Secondary | ICD-10-CM | POA: Diagnosis not present

## 2020-12-11 DIAGNOSIS — R41841 Cognitive communication deficit: Secondary | ICD-10-CM | POA: Diagnosis not present

## 2020-12-11 DIAGNOSIS — R2681 Unsteadiness on feet: Secondary | ICD-10-CM | POA: Diagnosis not present

## 2020-12-12 DIAGNOSIS — I10 Essential (primary) hypertension: Secondary | ICD-10-CM | POA: Diagnosis not present

## 2020-12-12 DIAGNOSIS — I351 Nonrheumatic aortic (valve) insufficiency: Secondary | ICD-10-CM | POA: Diagnosis not present

## 2020-12-13 DIAGNOSIS — R41841 Cognitive communication deficit: Secondary | ICD-10-CM | POA: Diagnosis not present

## 2020-12-13 DIAGNOSIS — F0280 Dementia in other diseases classified elsewhere without behavioral disturbance: Secondary | ICD-10-CM | POA: Diagnosis not present

## 2020-12-13 DIAGNOSIS — G309 Alzheimer's disease, unspecified: Secondary | ICD-10-CM | POA: Diagnosis not present

## 2020-12-13 DIAGNOSIS — R2681 Unsteadiness on feet: Secondary | ICD-10-CM | POA: Diagnosis not present

## 2020-12-14 DIAGNOSIS — R41841 Cognitive communication deficit: Secondary | ICD-10-CM | POA: Diagnosis not present

## 2020-12-14 DIAGNOSIS — F0280 Dementia in other diseases classified elsewhere without behavioral disturbance: Secondary | ICD-10-CM | POA: Diagnosis not present

## 2020-12-14 DIAGNOSIS — G309 Alzheimer's disease, unspecified: Secondary | ICD-10-CM | POA: Diagnosis not present

## 2020-12-14 DIAGNOSIS — R2681 Unsteadiness on feet: Secondary | ICD-10-CM | POA: Diagnosis not present

## 2020-12-15 DIAGNOSIS — R41841 Cognitive communication deficit: Secondary | ICD-10-CM | POA: Diagnosis not present

## 2020-12-15 DIAGNOSIS — F0280 Dementia in other diseases classified elsewhere without behavioral disturbance: Secondary | ICD-10-CM | POA: Diagnosis not present

## 2020-12-15 DIAGNOSIS — G309 Alzheimer's disease, unspecified: Secondary | ICD-10-CM | POA: Diagnosis not present

## 2020-12-15 DIAGNOSIS — R2681 Unsteadiness on feet: Secondary | ICD-10-CM | POA: Diagnosis not present

## 2020-12-18 DIAGNOSIS — F0280 Dementia in other diseases classified elsewhere without behavioral disturbance: Secondary | ICD-10-CM | POA: Diagnosis not present

## 2020-12-18 DIAGNOSIS — R2681 Unsteadiness on feet: Secondary | ICD-10-CM | POA: Diagnosis not present

## 2020-12-18 DIAGNOSIS — R41841 Cognitive communication deficit: Secondary | ICD-10-CM | POA: Diagnosis not present

## 2020-12-18 DIAGNOSIS — G309 Alzheimer's disease, unspecified: Secondary | ICD-10-CM | POA: Diagnosis not present

## 2020-12-20 DIAGNOSIS — R41841 Cognitive communication deficit: Secondary | ICD-10-CM | POA: Diagnosis not present

## 2020-12-20 DIAGNOSIS — F0280 Dementia in other diseases classified elsewhere without behavioral disturbance: Secondary | ICD-10-CM | POA: Diagnosis not present

## 2020-12-20 DIAGNOSIS — R2681 Unsteadiness on feet: Secondary | ICD-10-CM | POA: Diagnosis not present

## 2020-12-20 DIAGNOSIS — G309 Alzheimer's disease, unspecified: Secondary | ICD-10-CM | POA: Diagnosis not present

## 2020-12-21 DIAGNOSIS — R41841 Cognitive communication deficit: Secondary | ICD-10-CM | POA: Diagnosis not present

## 2020-12-21 DIAGNOSIS — F0280 Dementia in other diseases classified elsewhere without behavioral disturbance: Secondary | ICD-10-CM | POA: Diagnosis not present

## 2020-12-21 DIAGNOSIS — G309 Alzheimer's disease, unspecified: Secondary | ICD-10-CM | POA: Diagnosis not present

## 2020-12-21 DIAGNOSIS — R2681 Unsteadiness on feet: Secondary | ICD-10-CM | POA: Diagnosis not present

## 2020-12-25 DIAGNOSIS — F0280 Dementia in other diseases classified elsewhere without behavioral disturbance: Secondary | ICD-10-CM | POA: Diagnosis not present

## 2020-12-25 DIAGNOSIS — G309 Alzheimer's disease, unspecified: Secondary | ICD-10-CM | POA: Diagnosis not present

## 2020-12-25 DIAGNOSIS — R41841 Cognitive communication deficit: Secondary | ICD-10-CM | POA: Diagnosis not present

## 2020-12-25 DIAGNOSIS — R2681 Unsteadiness on feet: Secondary | ICD-10-CM | POA: Diagnosis not present

## 2020-12-27 DIAGNOSIS — G309 Alzheimer's disease, unspecified: Secondary | ICD-10-CM | POA: Diagnosis not present

## 2020-12-27 DIAGNOSIS — R41841 Cognitive communication deficit: Secondary | ICD-10-CM | POA: Diagnosis not present

## 2020-12-27 DIAGNOSIS — R2681 Unsteadiness on feet: Secondary | ICD-10-CM | POA: Diagnosis not present

## 2020-12-27 DIAGNOSIS — F0280 Dementia in other diseases classified elsewhere without behavioral disturbance: Secondary | ICD-10-CM | POA: Diagnosis not present

## 2020-12-28 DIAGNOSIS — F0280 Dementia in other diseases classified elsewhere without behavioral disturbance: Secondary | ICD-10-CM | POA: Diagnosis not present

## 2020-12-28 DIAGNOSIS — R2681 Unsteadiness on feet: Secondary | ICD-10-CM | POA: Diagnosis not present

## 2020-12-28 DIAGNOSIS — R41841 Cognitive communication deficit: Secondary | ICD-10-CM | POA: Diagnosis not present

## 2020-12-28 DIAGNOSIS — G309 Alzheimer's disease, unspecified: Secondary | ICD-10-CM | POA: Diagnosis not present

## 2021-01-01 DIAGNOSIS — R2681 Unsteadiness on feet: Secondary | ICD-10-CM | POA: Diagnosis not present

## 2021-01-01 DIAGNOSIS — F0280 Dementia in other diseases classified elsewhere without behavioral disturbance: Secondary | ICD-10-CM | POA: Diagnosis not present

## 2021-01-01 DIAGNOSIS — G309 Alzheimer's disease, unspecified: Secondary | ICD-10-CM | POA: Diagnosis not present

## 2021-01-01 DIAGNOSIS — R41841 Cognitive communication deficit: Secondary | ICD-10-CM | POA: Diagnosis not present

## 2021-01-02 DIAGNOSIS — R2681 Unsteadiness on feet: Secondary | ICD-10-CM | POA: Diagnosis not present

## 2021-01-02 DIAGNOSIS — G309 Alzheimer's disease, unspecified: Secondary | ICD-10-CM | POA: Diagnosis not present

## 2021-01-02 DIAGNOSIS — R41841 Cognitive communication deficit: Secondary | ICD-10-CM | POA: Diagnosis not present

## 2021-01-02 DIAGNOSIS — F0280 Dementia in other diseases classified elsewhere without behavioral disturbance: Secondary | ICD-10-CM | POA: Diagnosis not present

## 2021-01-03 DIAGNOSIS — G309 Alzheimer's disease, unspecified: Secondary | ICD-10-CM | POA: Diagnosis not present

## 2021-01-03 DIAGNOSIS — R41841 Cognitive communication deficit: Secondary | ICD-10-CM | POA: Diagnosis not present

## 2021-01-03 DIAGNOSIS — R2681 Unsteadiness on feet: Secondary | ICD-10-CM | POA: Diagnosis not present

## 2021-01-03 DIAGNOSIS — F0280 Dementia in other diseases classified elsewhere without behavioral disturbance: Secondary | ICD-10-CM | POA: Diagnosis not present

## 2021-01-09 DIAGNOSIS — R41841 Cognitive communication deficit: Secondary | ICD-10-CM | POA: Diagnosis not present

## 2021-01-09 DIAGNOSIS — R2681 Unsteadiness on feet: Secondary | ICD-10-CM | POA: Diagnosis not present

## 2021-01-09 DIAGNOSIS — G309 Alzheimer's disease, unspecified: Secondary | ICD-10-CM | POA: Diagnosis not present

## 2021-01-09 DIAGNOSIS — F0280 Dementia in other diseases classified elsewhere without behavioral disturbance: Secondary | ICD-10-CM | POA: Diagnosis not present

## 2021-01-10 DIAGNOSIS — F0280 Dementia in other diseases classified elsewhere without behavioral disturbance: Secondary | ICD-10-CM | POA: Diagnosis not present

## 2021-01-10 DIAGNOSIS — R2681 Unsteadiness on feet: Secondary | ICD-10-CM | POA: Diagnosis not present

## 2021-01-10 DIAGNOSIS — R41841 Cognitive communication deficit: Secondary | ICD-10-CM | POA: Diagnosis not present

## 2021-01-10 DIAGNOSIS — G309 Alzheimer's disease, unspecified: Secondary | ICD-10-CM | POA: Diagnosis not present

## 2021-01-15 DIAGNOSIS — R41841 Cognitive communication deficit: Secondary | ICD-10-CM | POA: Diagnosis not present

## 2021-01-15 DIAGNOSIS — R2681 Unsteadiness on feet: Secondary | ICD-10-CM | POA: Diagnosis not present

## 2021-01-15 DIAGNOSIS — G309 Alzheimer's disease, unspecified: Secondary | ICD-10-CM | POA: Diagnosis not present

## 2021-01-15 DIAGNOSIS — F0280 Dementia in other diseases classified elsewhere without behavioral disturbance: Secondary | ICD-10-CM | POA: Diagnosis not present

## 2021-01-25 DIAGNOSIS — R569 Unspecified convulsions: Secondary | ICD-10-CM | POA: Diagnosis not present

## 2021-01-25 DIAGNOSIS — I471 Supraventricular tachycardia: Secondary | ICD-10-CM | POA: Diagnosis not present

## 2021-01-25 DIAGNOSIS — E039 Hypothyroidism, unspecified: Secondary | ICD-10-CM | POA: Diagnosis not present

## 2021-01-25 DIAGNOSIS — F0390 Unspecified dementia without behavioral disturbance: Secondary | ICD-10-CM | POA: Diagnosis not present

## 2021-02-20 ENCOUNTER — Telehealth: Payer: Self-pay | Admitting: Family Medicine

## 2021-02-20 NOTE — Telephone Encounter (Signed)
Pt's son(on DPR) is asking for a call from RN to know if he and his mother can attend the appointment with pt.  Son also asking to have it confirmed if Dr Mamie Nick will manage pt's dementia medication

## 2021-02-21 ENCOUNTER — Encounter: Payer: Self-pay | Admitting: Diagnostic Neuroimaging

## 2021-02-21 ENCOUNTER — Ambulatory Visit (INDEPENDENT_AMBULATORY_CARE_PROVIDER_SITE_OTHER): Payer: Medicare Other | Admitting: Diagnostic Neuroimaging

## 2021-02-21 VITALS — BP 101/64 | HR 63 | Ht 69.0 in | Wt 134.6 lb

## 2021-02-21 DIAGNOSIS — G40209 Localization-related (focal) (partial) symptomatic epilepsy and epileptic syndromes with complex partial seizures, not intractable, without status epilepticus: Secondary | ICD-10-CM | POA: Diagnosis not present

## 2021-02-21 DIAGNOSIS — F03A0 Unspecified dementia, mild, without behavioral disturbance, psychotic disturbance, mood disturbance, and anxiety: Secondary | ICD-10-CM

## 2021-02-21 DIAGNOSIS — F0390 Unspecified dementia without behavioral disturbance: Secondary | ICD-10-CM | POA: Diagnosis not present

## 2021-02-21 NOTE — Progress Notes (Signed)
[facility]  PATIENT: [name] DOB: 03-06-1934  REFERRING CLINICIAN:  HISTORY FROM: patient REASON FOR VISIT: follow up   HISTORICAL  CHIEF COMPLAINT:  Chief Complaint  Patient presents with  . Dementia    Rm 6, son-Doug, lives at [place]  MMSE 20    HISTORY OF PRESENT ILLNESS:   UPDATE (02/21/21, VRP): Since last visit, got lost driving in [place]. Now no longer driving. Symptoms are progressing. Severity is mild-moderate. Now in assisted living at [facility]. No alleviating or aggravating factors. Tolerating meds. No seizures.  UPDATE (07/13/18, VRP): Since last visit, doing well. Symptoms are mild, but progressive. Severity is mild. No alleviating or aggravating factors. Tolerating meds. Having more problems with driving directions and getting lost.  UPDATE 07/08/17: Since last visit doing well. Memory loss stable. Neuropsych testing reviewed. No seizures.   UPDATE 03/03/17: Since last visit, doing about the same, with some mild worsening of memory loss issues.  UPDATE 02/01/16: Since last visit, doing about the same. No seizures . Memory issues stable.   UPDATE 04/27/15: Since last visit, still c/o memory issues. Mainly names and appts. Tries to write things down and and stay organized. Now living at [facility].   UPDATE 02/02/15: Since last visit, memory prob continue. No sz. More sleepy in daytime.   UPDATE 10/24/14: Since last visit, doing well. No sz. No spells. Memory loss is stable.   UPDATE 04/21/14: Since last visit, was doing well until 12/29/13, at coffee shop with friends, then had a 1 min episode of staring/memory loss. No convulsions or syncope. He thought this may have been a "mini sz" but he didn't contact our office. He contacted PCP, and was recommended to add LEV 220m in AM, in addition to his 5074mXR tabs in the evening. Memory loss is stable. Still driving. Recently transitioned to retirement community.   UPDATE 05/27/13 (Dr.  AtRexene Alberts [age]ear old right-handed gentleman who presents for followup consultation of his complex partial seizure disorder. He is accompanied by his wife today. This is his first visit after Dr. LoTressia Danasetirement and he was last seen by Dr. JaJeneen Rinksove on 12/02/2012, at which time Dr. LoErling Cruzid not change his antiseizure medication and felt hesitant to start donepezil for memory loss as it can lower seizure threshold. He suggested brain MRI and lab work including [place]. His MMSE at that time was 27/30, clock drawing was 4, animal fluency was 9. The patient has an underlying medical history of asthma, esophagitis, thyroid disease, arthritis status post right hip replacement surgery in 2005 and seizure disorder. He is currently on pravastatin, B12, axona, omeprazole, Synthroid, Advair, Keppra, vitamin B6, multivitamin.  PRIOR HPI (12/02/12, Dr. LoErling Cruz [age]ear old right-handed white married male with complex partial seizures first diagnosed in 1983 with normal CAT scan and MRI study of the brain but two EEGs showing evidence of left temporal lobe epileptiform activity. He developed side effects on Dilantin which was discontinued in 1986. He  had 2 simple partial seizures and Dilantin was reinstituted. Dilantin was changed to Tegretol but he developed lethargy on Tegretol and was tapered off.He had a seizure 05/10/1997 and Tegretol was restarted.He was tried [facility], [facility] began having episodes of strange feeling lasting 20-30 seconds with dj vu and memory loss. During these episodes he would be unresponsive according his wife who would shake him. MRI 06/05/2005 was normal except for mild atrophy and EEG 10/11/2005 was normal.  He was placed on Lamictal but did not tolerate this well and continued to have seizures on 300 mg per day. He had a witnessed seizure 08/26/2006 in [place] office. He was slowly changed to [place] and has not had recurrent seizures but  has had poor tolerance with sleepiness. He was changed from 500 mg twice per day to 500 mg ER twice daily and subsequently 250 mg levetiracetam 3 times per day. He is now on levetiracetam 500 mg ER once daily . I saw him 06/19/09 for episodes of vague sensation with spinning lasting 20-30 seconds. These would occur sitting, lying, standing, and when turning over in bed. There was no loss of consciousness or aura of macropsia, micropsia, dj vu, strange odors or  tastes. His examination was normal with negative Dix-Hallpike hanging head test. He started  vertigo exercises and his symptoms resolved.  If he does not do his exercises the symptoms recur. He complains of memory loss He has been evaluated 06/2007 for memory loss with a neuropsychological battery showing superior intelligence, but loss of short-term and delayed memory. His mother had memory loss beginning in her 19s. He is able to pay the bills and right the checks.He can't  remember what day it is. He has difficulty with peoples names that he has known for years. He takes naps for 15 minutes after breakfast, and one to 2 naps after lunch. He drinks coffee during the day, small amounts  because of his Barrett's esophagitis which he states helps his memory. He does not snore at night. He awakens refreshed in the mornings. He is independent in activities of daily. 12/03/2011=(MMSE 26/30. CDT 4/4. AFT 12. [name] Index of independence in activities of daily living 6. Lawton-Brody instrumental activities of daily living scale 7. Neuropsychiatric inventory=0. Geriatric depression scale 2/15. Falls assessment tool score 5.CBC, CMP,  lipid profile, and TSH normal 11/13/11. He has not had recurrent seizures. He denies macropsia, micropsia, strange odors or tastes.He complains of daytime sleepiness and takeslevetiracetam 500 mg ER once at night. He complains of worsening long term memory, particularly noticed after gathering of classmates from his college days. He is  performing [email or web address] and an exercise program with  improvement  in his memory He exercises by doing calisthenics 5 times per week, but  injured his right ankle many years ago when he fell off of a tank and has recurrent right ankle pain, uses a brace. 12/02/2012=( MMSE 27/30. CDT 4/4. AFT 9. [name] 6. Lawton-Brody 7. Neuropsychiatric inventory=0.  Geriatric depression scale 1/15 Falls assessment tool score 5).   REVIEW OF SYSTEMS: Full 14 system review of systems performed and negative except: cough seizure disorder joint pain swelling.   ALLERGIES: Allergies  Allergen Reactions  . Dilantin [Phenytoin Sodium Extended] Other (See Comments)    Reaction not recalled by the patient (??)  . Lamotrigine Other (See Comments)    Drowsiness Drowsiness Drowsiness Drowsiness  Drowsiness  . Phenytoin Sodium Extended Other (See Comments)    Reaction not recalled by the patient (??)  . Sulfa Antibiotics Other (See Comments)    From childhood; reaction not recalled  . Sulfamethoxazole Other (See Comments)    Other Other  . Sulfonamide Derivatives Other (See Comments)    From childhood; reaction not recalled    HOME MEDICATIONS: Outpatient Medications Prior to Visit  Medication Sig Dispense Refill  . acetaminophen (TYLENOL) 650 MG CR tablet as needed.    Marland Kitchen aspirin 81 MG EC tablet daily.    . bifidobacterium  infantis (ALIGN) capsule daily.    . Cholecalciferol 50 MCG (2000 UT) CAPS daily.    . cromolyn (NASALCROM) 5.2 MG/ACT nasal spray in the morning and at bedtime.    . docusate sodium (COLACE) 100 MG capsule daily.    Marland Kitchen donepezil (ARICEPT) 10 MG tablet Take by mouth.    . finasteride (PROSCAR) 5 MG tablet Take 5 mg by mouth daily.   0  . Levetiracetam 750 MG TB24 Take 1 tablet (750 mg total) by mouth at bedtime. 90 tablet 0  . levothyroxine (SYNTHROID) 75 MCG tablet daily.    . memantine (NAMENDA) 5 MG tablet Take 10 mg by mouth 2 (two) times daily.    . metoprolol succinate (TOPROL-XL)  25 MG 24 hr tablet daily.    . polyethylene glycol powder (GLYCOLAX/MIRALAX) 17 GM/SCOOP powder as needed.    . pravastatin (PRAVACHOL) 20 MG tablet Take 20 mg by mouth at bedtime.    Marland Kitchen atorvastatin (LIPITOR) 40 MG tablet Take 40 mg by mouth daily. (Patient not taking: Reported on 02/21/2021)    . Cyanocobalamin (VITAMIN B-12 SL) Place 1 tablet under the tongue daily with breakfast. (Patient not taking: Reported on 02/21/2021)    . loperamide (IMODIUM A-D) 2 MG tablet Take 2 mg by mouth daily as needed for diarrhea or loose stools. (Patient not taking: Reported on 02/21/2021)    . Multiple Vitamin (MULTIVITAMIN) tablet Take 1 tablet by mouth daily.   (Patient not taking: Reported on 02/21/2021)    . omeprazole (PRILOSEC) 20 MG capsule Take 20 mg by mouth at bedtime.  (Patient not taking: Reported on 02/21/2021)    . predniSONE (DELTASONE) 10 MG tablet Take 10 mg by mouth daily with breakfast. (Patient not taking: Reported on 02/21/2021)    . traMADol (ULTRAM) 50 MG tablet Take 50 mg by mouth every 6 (six) hours as needed (for pain). (Patient not taking: Reported on 02/21/2021)    . acetaminophen (TYLENOL) 500 MG tablet Take 1,000 mg by mouth every 6 (six) hours as needed for mild pain or headache.  (Patient not taking: Reported on 02/21/2021)    . cromolyn (NASALCROM) 5.2 MG/ACT nasal spray Place 1 spray into both nostrils 2 (two) times daily. (Patient not taking: Reported on 02/21/2021)    . levothyroxine (SYNTHROID, LEVOTHROID) 100 MCG tablet Take 100 mcg by mouth daily before breakfast. Takes every day except sunday     No facility-administered medications prior to visit.    PAST MEDICAL HISTORY: Past Medical History:  Diagnosis Date  . Acute bronchitis 09/05/2009   Qualifier: Diagnosis of  By: Annamaria Boots MD, Clinton D   . Allergic rhinitis   . ALLERGIC RHINITIS 09/26/2007   Qualifier: Diagnosis of  By: June Leap    . Asthma   . Asthma with bronchitis 09/26/2007   Qualifier: Diagnosis of  By: June Leap     . Barrett syndrome   . BPH (benign prostatic hyperplasia)   . COPD (chronic obstructive pulmonary disease) ([place])   . Dementia ([place])   . Depressive disorder, not elsewhere classified   . Dizziness and giddiness   . Esophageal reflux   . Hypothyroid   . 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 Memory loss   . Other and unspecified hyperlipidemia   . Pain in right ankle and joints of right foot 03/03/2017  . Post-traumatic osteoarthritis, right ankle and foot 03/03/2017  . SEIZURE DISORDER 09/26/2007   Qualifier: Diagnosis  of  By: June Leap    . Seizure disorder ([place])   . SVT (supraventricular tachycardia) ([place]) 04/27/2018  . Unspecified hypothyroidism     PAST SURGICAL HISTORY: Past Surgical History:  Procedure Laterality Date  . APPENDECTOMY    . right hip replacement  2005  . SVT ABLATION N/A 04/27/2018   Procedure: SVT ABLATION;  Surgeon: Evans Lance, MD;  Location: [facility];  Service: Cardiovascular;  Laterality: N/A;    FAMILY HISTORY: Family History  Problem Relation Age of Onset  . Heart disease Mother   . Memory loss Mother   . Asthma Sister   . Parkinson's disease Sister     SOCIAL HISTORY:  Social History   Socioeconomic History  . Marital status: Married    Spouse name: Eugenia  . Number of children: 2  . Years of education: BA  . Highest education level: Not on file  Occupational History  . 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 Smoking status: Never Smoker  . Smokeless tobacco: Never Used  Vaping Use  . Vaping Use: Never used  Substance and Sexual Activity  . Alcohol use: No    Alcohol/week: 0.0 standard drinks  . Drug use: No  . Sexual activity: Not on file  Other Topics Concern  . Not on file  Social History Narrative   Patient lives at [place] with wife.    Caffeine Use: up to 3 cups daily    Social Determinants of Health   Financial Resource Strain: Not on file  Food Insecurity: Not on file  Transportation Needs: Not on file  Physical Activity: Not on file  Stress: Not on file  Social Connections: Not on file  Intimate Partner Violence: Not on file     PHYSICAL EXAM  Vitals:   02/21/21 1019  BP: 101/64  Pulse: 63  Weight: 134 lb 9.6 oz (61.1 kg)  Height: '5\' 9"'  (1.753 m)   Not recorded    Body mass index is 19.88 kg/m.  MMSE - Mini Mental State Exam 02/21/2021 07/29/2019 07/13/2018  Orientation to time 2 0 4  Orientation to Place '3 5 4  ' Registration '3 3 3  ' Attention/ Calculation '4 5 5  ' Recall 0 0 0  Language- name 2 objects '2 2 2  ' Language- repeat 0 1 1  Language- follow 3 step command '3 3 3  ' Language- read & follow direction '1 1 1  ' Write a sentence '1 1 1  ' Write a sentence-comments - drew clock 10 plast 11 -  Copy design '1 1 1  ' Copy design-comments - named 5 animals -  Total score '20 22 25     ' GENERAL EXAM: Patient is in no distress; well developed, nourished and groomed; neck is supple  CARDIOVASCULAR: Regular rate and rhythm, no murmurs, no carotid bruits  NEUROLOGIC: MENTAL STATUS: awake, alert, language fluent, comprehension intact, naming intact, fund of knowledge appropriate.  CRANIAL NERVE: pupils equal and reactive to light, visual fields full to confrontation, extraocular muscles intact, no nystagmus, facial sensation and strength symmetric, hearing intact, palate elevates symmetrically, uvula midline, shoulder shrug symmetric, tongue midline. MOTOR: normal bulk and tone, full strength in the BUE, BLE SENSORY: normal and symmetric to light touch COORDINATION: finger-nose-finger, fine finger movements normal REFLEXES: deep tendon reflexes present and symmetric GAIT/STATION: SLOW, CAUTIOUS     DIAGNOSTIC DATA (LABS, IMAGING, TESTING) - I reviewed patient records, labs, notes, testing and imaging myself where  available.  Lab Results   Component Value Date   WBC 3.9 (L) 04/20/2018      Component Value Date/Time   NA 138 04/20/2018 2155   No results found for: CHOL No results found for: HGBA1C No results found for: VITAMINB12 No results found for: TSH  12/12/12 MRI brain - Mild generalized cerebral atrophy. No significant changes compared with prior MRI scan dated 01/09/2010.  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  Results of the current evaluation demonstrate impairment in consolidation of new memories (particularly verbal information), reduced orientation to time, and semantic retrieval deficits. Meanwhile, visual-spatial skills (which were always likely a relative strength), auditory attention, and abstract reasoning are all well above average (ie, in the high average to very superior range for his age). Furthermore, mental flexibility, immediate recall of new information, phonemic verbal fluency, auditory comprehension, and processing speed are al in the average range for his age. The patient and his wife deny any interference in his ability to manage complex ADLs including driving, medications, appointments and finances. As such, diagnostic criteria for a dementia syndrome are not met at this time. 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 His cognitive profile matches what is typically seen in prodromal Alzheimer's disease. I suspect he has a high level of cognitive reserve which as provided some protection for him; additionally, donepezil and mementine may be providing some additional benefit and reducing the rate of conversion of MCI to dementia. There is no evidence of a primary psychiatric disorder.    ASSESSMENT AND PLAN  87 y.o. year old male here with complex partial seizures and memory loss. May have had a seizure vs memory lapse spell in  March 2015. No more events since on LEV 732m daily. 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   Dx:  Partial symptomatic epilepsy with complex partial seizures, not intractable, without status epilepticus ([place]  Mild dementia ([place]    PLAN:  MEMORY LOSS (mild-moderate DEMENTIA) - continue donepezil + memantine  - safety / supervision issues reviewed - daily physical activity / exercise (at least 15-30 minutes) - eat more plants / vegetables - increase social activities, brain stimulation, games, puzzles, hobbies, crafts, arts, music - aim for at least 7-8 hours sleep per night (or more) - avoid smoking and alcohol - no driving; needs supervision for medications and finances  SEIZURE DISRODER (stable; last seizure 2015) - continue levetiracetam XR 7552mat bedtime  Return for return to PCP, pending if symptoms worsen or fail to improve.    VIPenni BombardMD 5/3/8/[id number]:87M Certified in Neurology, Neurophysiology and Neuroimaging  [facility] [address]Su[place]riar[place]iffrOrchard[place] [phone]

## 2021-04-16 DIAGNOSIS — R634 Abnormal weight loss: Secondary | ICD-10-CM | POA: Diagnosis not present

## 2021-04-16 DIAGNOSIS — F0280 Dementia in other diseases classified elsewhere without behavioral disturbance: Secondary | ICD-10-CM | POA: Diagnosis not present

## 2021-06-07 DIAGNOSIS — I471 Supraventricular tachycardia: Secondary | ICD-10-CM | POA: Diagnosis not present

## 2021-06-07 DIAGNOSIS — F0390 Unspecified dementia without behavioral disturbance: Secondary | ICD-10-CM | POA: Diagnosis not present

## 2021-06-07 DIAGNOSIS — E559 Vitamin D deficiency, unspecified: Secondary | ICD-10-CM | POA: Diagnosis not present

## 2021-06-07 DIAGNOSIS — R569 Unspecified convulsions: Secondary | ICD-10-CM | POA: Diagnosis not present

## 2021-06-08 DIAGNOSIS — E559 Vitamin D deficiency, unspecified: Secondary | ICD-10-CM | POA: Diagnosis not present

## 2021-06-08 DIAGNOSIS — E785 Hyperlipidemia, unspecified: Secondary | ICD-10-CM | POA: Diagnosis not present

## 2021-06-08 DIAGNOSIS — G309 Alzheimer's disease, unspecified: Secondary | ICD-10-CM | POA: Diagnosis not present

## 2021-06-08 DIAGNOSIS — J449 Chronic obstructive pulmonary disease, unspecified: Secondary | ICD-10-CM | POA: Diagnosis not present

## 2021-06-08 DIAGNOSIS — N400 Enlarged prostate without lower urinary tract symptoms: Secondary | ICD-10-CM | POA: Diagnosis not present

## 2021-06-08 DIAGNOSIS — M1990 Unspecified osteoarthritis, unspecified site: Secondary | ICD-10-CM | POA: Diagnosis not present

## 2021-06-08 DIAGNOSIS — G40909 Epilepsy, unspecified, not intractable, without status epilepticus: Secondary | ICD-10-CM | POA: Diagnosis not present

## 2021-06-08 DIAGNOSIS — E039 Hypothyroidism, unspecified: Secondary | ICD-10-CM | POA: Diagnosis not present

## 2021-06-08 DIAGNOSIS — Z7982 Long term (current) use of aspirin: Secondary | ICD-10-CM | POA: Diagnosis not present

## 2021-06-08 DIAGNOSIS — K219 Gastro-esophageal reflux disease without esophagitis: Secondary | ICD-10-CM | POA: Diagnosis not present

## 2021-07-03 DIAGNOSIS — Z20822 Contact with and (suspected) exposure to covid-19: Secondary | ICD-10-CM | POA: Diagnosis not present

## 2021-07-21 DIAGNOSIS — Z20822 Contact with and (suspected) exposure to covid-19: Secondary | ICD-10-CM | POA: Diagnosis not present

## 2021-08-03 DIAGNOSIS — Z23 Encounter for immunization: Secondary | ICD-10-CM | POA: Diagnosis not present

## 2021-08-10 DIAGNOSIS — Z23 Encounter for immunization: Secondary | ICD-10-CM | POA: Diagnosis not present

## 2021-08-27 DIAGNOSIS — Z20822 Contact with and (suspected) exposure to covid-19: Secondary | ICD-10-CM | POA: Diagnosis not present

## 2021-08-31 DIAGNOSIS — Z20822 Contact with and (suspected) exposure to covid-19: Secondary | ICD-10-CM | POA: Diagnosis not present

## 2021-09-06 DIAGNOSIS — Z20822 Contact with and (suspected) exposure to covid-19: Secondary | ICD-10-CM | POA: Diagnosis not present

## 2021-10-10 ENCOUNTER — Telehealth: Payer: Self-pay | Admitting: Diagnostic Neuroimaging

## 2021-10-10 DIAGNOSIS — G40209 Localization-related (focal) (partial) symptomatic epilepsy and epileptic syndromes with complex partial seizures, not intractable, without status epilepticus: Secondary | ICD-10-CM

## 2021-10-10 NOTE — Telephone Encounter (Signed)
Called [name] and LVM requesting call back. This patient is not on Lamictal. He takes levetiracetam.

## 2021-10-10 NOTE — Telephone Encounter (Signed)
[name] @ [address] is asking if there is another form or a lower dose of Lamictal that pt can take due to him having difficulty swallowing current dose, please call.

## 2021-10-11 MED ORDER — LEVETIRACETAM 100 MG/ML PO SOLN: 750.0000 mg | Freq: Every day | ORAL | 5 refills | Status: DC

## 2021-10-11 NOTE — Telephone Encounter (Signed)
Per Dr Leta Baptist, ordered levetiracetam solution for patient. Called Manuela Schwartz and advised  her of new Rx. Manuela Schwartz stated she may call his son to see if he can pick up form pharmacy so they don't have to wait for it to be mailed, verbalized understanding, appreciation.

## 2021-10-11 NOTE — Telephone Encounter (Addendum)
Received call back from Manuela Schwartz, nurse supervisor, advised her patient is not on lamictal, but is on levetiracetam. She verified that is on his record, stated he is having difficulty swallowing 750 mg tab. She had talked to their pharmacist and was told they have levetiracetam Oral disintegrating tablets in 25, 50, 100, and 200 mg doses. I informed Jonne Ply send to MD, and advised we are closed tomorrow and Mon for holiday. She stated she will not be at work those days either, verbalized understanding, appreciation.

## 2021-10-11 NOTE — Addendum Note (Signed)
Addended by: Minna Antis on: 10/11/2021 11:55 AM   Modules accepted: Orders

## 2021-11-13 DIAGNOSIS — Z20822 Contact with and (suspected) exposure to covid-19: Secondary | ICD-10-CM | POA: Diagnosis not present

## 2021-11-21 DIAGNOSIS — G309 Alzheimer's disease, unspecified: Secondary | ICD-10-CM | POA: Diagnosis not present

## 2021-11-21 DIAGNOSIS — E559 Vitamin D deficiency, unspecified: Secondary | ICD-10-CM | POA: Diagnosis not present

## 2021-11-21 DIAGNOSIS — E039 Hypothyroidism, unspecified: Secondary | ICD-10-CM | POA: Diagnosis not present

## 2021-11-29 DIAGNOSIS — E039 Hypothyroidism, unspecified: Secondary | ICD-10-CM | POA: Diagnosis not present

## 2021-11-29 DIAGNOSIS — G301 Alzheimer's disease with late onset: Secondary | ICD-10-CM | POA: Diagnosis not present

## 2021-11-29 DIAGNOSIS — R569 Unspecified convulsions: Secondary | ICD-10-CM | POA: Diagnosis not present

## 2021-11-29 DIAGNOSIS — F0280 Dementia in other diseases classified elsewhere without behavioral disturbance: Secondary | ICD-10-CM | POA: Diagnosis not present

## 2021-11-29 DIAGNOSIS — I959 Hypotension, unspecified: Secondary | ICD-10-CM | POA: Diagnosis not present

## 2021-11-29 DIAGNOSIS — I471 Supraventricular tachycardia: Secondary | ICD-10-CM | POA: Diagnosis not present

## 2021-12-03 DIAGNOSIS — Z20822 Contact with and (suspected) exposure to covid-19: Secondary | ICD-10-CM | POA: Diagnosis not present

## 2021-12-04 DIAGNOSIS — U071 COVID-19: Secondary | ICD-10-CM | POA: Diagnosis not present

## 2021-12-12 DIAGNOSIS — Z20822 Contact with and (suspected) exposure to covid-19: Secondary | ICD-10-CM | POA: Diagnosis not present

## 2021-12-14 DIAGNOSIS — Z20822 Contact with and (suspected) exposure to covid-19: Secondary | ICD-10-CM | POA: Diagnosis not present

## 2021-12-17 DIAGNOSIS — Z20822 Contact with and (suspected) exposure to covid-19: Secondary | ICD-10-CM | POA: Diagnosis not present

## 2021-12-20 DIAGNOSIS — Z20822 Contact with and (suspected) exposure to covid-19: Secondary | ICD-10-CM | POA: Diagnosis not present

## 2021-12-22 DIAGNOSIS — Z20822 Contact with and (suspected) exposure to covid-19: Secondary | ICD-10-CM | POA: Diagnosis not present

## 2021-12-26 DIAGNOSIS — Z20822 Contact with and (suspected) exposure to covid-19: Secondary | ICD-10-CM | POA: Diagnosis not present

## 2021-12-31 DIAGNOSIS — Z20822 Contact with and (suspected) exposure to covid-19: Secondary | ICD-10-CM | POA: Diagnosis not present

## 2022-01-02 DIAGNOSIS — Z20822 Contact with and (suspected) exposure to covid-19: Secondary | ICD-10-CM | POA: Diagnosis not present

## 2022-01-03 DIAGNOSIS — Z20822 Contact with and (suspected) exposure to covid-19: Secondary | ICD-10-CM | POA: Diagnosis not present

## 2022-01-08 DIAGNOSIS — Z20822 Contact with and (suspected) exposure to covid-19: Secondary | ICD-10-CM | POA: Diagnosis not present

## 2022-01-15 DIAGNOSIS — E039 Hypothyroidism, unspecified: Secondary | ICD-10-CM | POA: Diagnosis not present

## 2022-01-15 DIAGNOSIS — G309 Alzheimer's disease, unspecified: Secondary | ICD-10-CM | POA: Diagnosis not present

## 2022-01-15 DIAGNOSIS — R5383 Other fatigue: Secondary | ICD-10-CM | POA: Diagnosis not present

## 2022-01-15 DIAGNOSIS — F5104 Psychophysiologic insomnia: Secondary | ICD-10-CM | POA: Diagnosis not present

## 2022-01-15 DIAGNOSIS — J449 Chronic obstructive pulmonary disease, unspecified: Secondary | ICD-10-CM | POA: Diagnosis not present

## 2022-01-15 DIAGNOSIS — R634 Abnormal weight loss: Secondary | ICD-10-CM | POA: Diagnosis not present

## 2022-01-16 DIAGNOSIS — Z20822 Contact with and (suspected) exposure to covid-19: Secondary | ICD-10-CM | POA: Diagnosis not present

## 2022-01-19 DIAGNOSIS — Z20822 Contact with and (suspected) exposure to covid-19: Secondary | ICD-10-CM | POA: Diagnosis not present

## 2022-01-24 DIAGNOSIS — U071 COVID-19: Secondary | ICD-10-CM | POA: Diagnosis not present

## 2022-01-26 DIAGNOSIS — Z20822 Contact with and (suspected) exposure to covid-19: Secondary | ICD-10-CM | POA: Diagnosis not present

## 2022-01-29 DIAGNOSIS — Z20822 Contact with and (suspected) exposure to covid-19: Secondary | ICD-10-CM | POA: Diagnosis not present

## 2022-02-05 DIAGNOSIS — Z20822 Contact with and (suspected) exposure to covid-19: Secondary | ICD-10-CM | POA: Diagnosis not present

## 2022-02-07 DIAGNOSIS — Z20822 Contact with and (suspected) exposure to covid-19: Secondary | ICD-10-CM | POA: Diagnosis not present

## 2022-02-08 DIAGNOSIS — F03B3 Unspecified dementia, moderate, with mood disturbance: Secondary | ICD-10-CM | POA: Diagnosis not present

## 2022-02-08 DIAGNOSIS — F321 Major depressive disorder, single episode, moderate: Secondary | ICD-10-CM | POA: Diagnosis not present

## 2022-02-09 DIAGNOSIS — Z20828 Contact with and (suspected) exposure to other viral communicable diseases: Secondary | ICD-10-CM | POA: Diagnosis not present

## 2022-02-10 DIAGNOSIS — M5137 Other intervertebral disc degeneration, lumbosacral region: Secondary | ICD-10-CM | POA: Diagnosis not present

## 2022-02-10 DIAGNOSIS — W19XXXA Unspecified fall, initial encounter: Secondary | ICD-10-CM | POA: Diagnosis not present

## 2022-02-10 DIAGNOSIS — M5459 Other low back pain: Secondary | ICD-10-CM | POA: Diagnosis not present

## 2022-02-11 DIAGNOSIS — R634 Abnormal weight loss: Secondary | ICD-10-CM | POA: Diagnosis not present

## 2022-02-11 DIAGNOSIS — G309 Alzheimer's disease, unspecified: Secondary | ICD-10-CM | POA: Diagnosis not present

## 2022-02-11 DIAGNOSIS — F32A Depression, unspecified: Secondary | ICD-10-CM | POA: Diagnosis not present

## 2022-02-11 DIAGNOSIS — Z20822 Contact with and (suspected) exposure to covid-19: Secondary | ICD-10-CM | POA: Diagnosis not present

## 2022-02-11 DIAGNOSIS — F0280 Dementia in other diseases classified elsewhere without behavioral disturbance: Secondary | ICD-10-CM | POA: Diagnosis not present

## 2022-02-11 DIAGNOSIS — R54 Age-related physical debility: Secondary | ICD-10-CM | POA: Diagnosis not present

## 2022-02-12 DIAGNOSIS — Z20822 Contact with and (suspected) exposure to covid-19: Secondary | ICD-10-CM | POA: Diagnosis not present

## 2022-02-14 DIAGNOSIS — M6281 Muscle weakness (generalized): Secondary | ICD-10-CM | POA: Diagnosis not present

## 2022-02-14 DIAGNOSIS — M545 Low back pain, unspecified: Secondary | ICD-10-CM | POA: Diagnosis not present

## 2022-02-14 DIAGNOSIS — Z9181 History of falling: Secondary | ICD-10-CM | POA: Diagnosis not present

## 2022-02-14 DIAGNOSIS — F321 Major depressive disorder, single episode, moderate: Secondary | ICD-10-CM | POA: Diagnosis not present

## 2022-02-14 DIAGNOSIS — R2689 Other abnormalities of gait and mobility: Secondary | ICD-10-CM | POA: Diagnosis not present

## 2022-02-14 DIAGNOSIS — M47817 Spondylosis without myelopathy or radiculopathy, lumbosacral region: Secondary | ICD-10-CM | POA: Diagnosis not present

## 2022-02-15 DIAGNOSIS — Z20822 Contact with and (suspected) exposure to covid-19: Secondary | ICD-10-CM | POA: Diagnosis not present

## 2022-02-17 DIAGNOSIS — Z20822 Contact with and (suspected) exposure to covid-19: Secondary | ICD-10-CM | POA: Diagnosis not present

## 2022-02-18 DIAGNOSIS — R2689 Other abnormalities of gait and mobility: Secondary | ICD-10-CM | POA: Diagnosis not present

## 2022-02-18 DIAGNOSIS — M47817 Spondylosis without myelopathy or radiculopathy, lumbosacral region: Secondary | ICD-10-CM | POA: Diagnosis not present

## 2022-02-18 DIAGNOSIS — M545 Low back pain, unspecified: Secondary | ICD-10-CM | POA: Diagnosis not present

## 2022-02-18 DIAGNOSIS — Z9181 History of falling: Secondary | ICD-10-CM | POA: Diagnosis not present

## 2022-02-18 DIAGNOSIS — M6281 Muscle weakness (generalized): Secondary | ICD-10-CM | POA: Diagnosis not present

## 2022-02-18 DIAGNOSIS — Z20822 Contact with and (suspected) exposure to covid-19: Secondary | ICD-10-CM | POA: Diagnosis not present

## 2022-02-19 DIAGNOSIS — M6281 Muscle weakness (generalized): Secondary | ICD-10-CM | POA: Diagnosis not present

## 2022-02-19 DIAGNOSIS — Z9181 History of falling: Secondary | ICD-10-CM | POA: Diagnosis not present

## 2022-02-19 DIAGNOSIS — R2689 Other abnormalities of gait and mobility: Secondary | ICD-10-CM | POA: Diagnosis not present

## 2022-02-19 DIAGNOSIS — M545 Low back pain, unspecified: Secondary | ICD-10-CM | POA: Diagnosis not present

## 2022-02-19 DIAGNOSIS — M47817 Spondylosis without myelopathy or radiculopathy, lumbosacral region: Secondary | ICD-10-CM | POA: Diagnosis not present

## 2022-02-20 DIAGNOSIS — M545 Low back pain, unspecified: Secondary | ICD-10-CM | POA: Diagnosis not present

## 2022-02-20 DIAGNOSIS — Z20822 Contact with and (suspected) exposure to covid-19: Secondary | ICD-10-CM | POA: Diagnosis not present

## 2022-02-20 DIAGNOSIS — R2689 Other abnormalities of gait and mobility: Secondary | ICD-10-CM | POA: Diagnosis not present

## 2022-02-20 DIAGNOSIS — Z9181 History of falling: Secondary | ICD-10-CM | POA: Diagnosis not present

## 2022-02-20 DIAGNOSIS — M47817 Spondylosis without myelopathy or radiculopathy, lumbosacral region: Secondary | ICD-10-CM | POA: Diagnosis not present

## 2022-02-20 DIAGNOSIS — M6281 Muscle weakness (generalized): Secondary | ICD-10-CM | POA: Diagnosis not present

## 2022-02-21 DIAGNOSIS — M47817 Spondylosis without myelopathy or radiculopathy, lumbosacral region: Secondary | ICD-10-CM | POA: Diagnosis not present

## 2022-02-21 DIAGNOSIS — Z20822 Contact with and (suspected) exposure to covid-19: Secondary | ICD-10-CM | POA: Diagnosis not present

## 2022-02-21 DIAGNOSIS — M6281 Muscle weakness (generalized): Secondary | ICD-10-CM | POA: Diagnosis not present

## 2022-02-21 DIAGNOSIS — M545 Low back pain, unspecified: Secondary | ICD-10-CM | POA: Diagnosis not present

## 2022-02-21 DIAGNOSIS — Z9181 History of falling: Secondary | ICD-10-CM | POA: Diagnosis not present

## 2022-02-21 DIAGNOSIS — F321 Major depressive disorder, single episode, moderate: Secondary | ICD-10-CM | POA: Diagnosis not present

## 2022-02-21 DIAGNOSIS — R2689 Other abnormalities of gait and mobility: Secondary | ICD-10-CM | POA: Diagnosis not present

## 2022-02-24 DIAGNOSIS — Z20822 Contact with and (suspected) exposure to covid-19: Secondary | ICD-10-CM | POA: Diagnosis not present

## 2022-02-25 ENCOUNTER — Emergency Department (HOSPITAL_COMMUNITY)
Admission: EM | Admit: 2022-02-25 | Discharge: 2022-02-26 | Disposition: A | Discharge status: Home / Self Care | Payer: Medicare Other | Attending: Emergency Medicine | Admitting: Emergency Medicine

## 2022-02-25 ENCOUNTER — Emergency Department (HOSPITAL_COMMUNITY): Payer: Medicare Other

## 2022-02-25 DIAGNOSIS — M549 Dorsalgia, unspecified: Secondary | ICD-10-CM | POA: Diagnosis not present

## 2022-02-25 DIAGNOSIS — Z79899 Other long term (current) drug therapy: Secondary | ICD-10-CM | POA: Diagnosis not present

## 2022-02-25 DIAGNOSIS — F0390 Unspecified dementia without behavioral disturbance: Secondary | ICD-10-CM | POA: Diagnosis not present

## 2022-02-25 DIAGNOSIS — E039 Hypothyroidism, unspecified: Secondary | ICD-10-CM | POA: Insufficient documentation

## 2022-02-25 DIAGNOSIS — R079 Chest pain, unspecified: Secondary | ICD-10-CM

## 2022-02-25 DIAGNOSIS — M6281 Muscle weakness (generalized): Secondary | ICD-10-CM | POA: Diagnosis not present

## 2022-02-25 DIAGNOSIS — J449 Chronic obstructive pulmonary disease, unspecified: Secondary | ICD-10-CM | POA: Diagnosis not present

## 2022-02-25 DIAGNOSIS — Z9181 History of falling: Secondary | ICD-10-CM | POA: Diagnosis not present

## 2022-02-25 DIAGNOSIS — R2689 Other abnormalities of gait and mobility: Secondary | ICD-10-CM | POA: Diagnosis not present

## 2022-02-25 DIAGNOSIS — Z20822 Contact with and (suspected) exposure to covid-19: Secondary | ICD-10-CM | POA: Diagnosis not present

## 2022-02-25 DIAGNOSIS — M47817 Spondylosis without myelopathy or radiculopathy, lumbosacral region: Secondary | ICD-10-CM | POA: Diagnosis not present

## 2022-02-25 DIAGNOSIS — M545 Low back pain, unspecified: Secondary | ICD-10-CM | POA: Diagnosis not present

## 2022-02-25 DIAGNOSIS — R0789 Other chest pain: Secondary | ICD-10-CM | POA: Diagnosis not present

## 2022-02-25 DIAGNOSIS — Z7982 Long term (current) use of aspirin: Secondary | ICD-10-CM | POA: Diagnosis not present

## 2022-02-25 LAB — TROPONIN I (HIGH SENSITIVITY)
Troponin I (High Sensitivity): 5 ng/L (ref <18)
Troponin I (High Sensitivity): 4 ng/L (ref <18)

## 2022-02-25 LAB — CBC WITH DIFFERENTIAL/PLATELET
Abs Immature Granulocytes: 0.02 10*3/uL (ref 0.00–0.07)
Basophils Absolute: 0 10*3/uL (ref 0.0–0.1)
Basophils Relative: 1 %
Eosinophils Absolute: 0.1 10*3/uL (ref 0.0–0.5)
Eosinophils Relative: 2 %
HCT: 38.5 % — ABNORMAL LOW (ref 39.0–52.0)
Hemoglobin: 12.5 g/dL — ABNORMAL LOW (ref 13.0–17.0)
Immature Granulocytes: 0 %
Lymphocytes Relative: 10 %
Lymphs Abs: 0.7 10*3/uL (ref 0.7–4.0)
MCH: 31 pg (ref 26.0–34.0)
MCHC: 32.5 g/dL (ref 30.0–36.0)
MCV: 95.5 fL (ref 80.0–100.0)
Monocytes Absolute: 0.4 10*3/uL (ref 0.1–1.0)
Monocytes Relative: 5 %
Neutro Abs: 5.3 10*3/uL (ref 1.7–7.7)
Neutrophils Relative %: 82 %
Platelets: 218 10*3/uL (ref 150–400)
RBC: 4.03 MIL/uL — ABNORMAL LOW (ref 4.22–5.81)
RDW: 14.7 % (ref 11.5–15.5)
WBC: 6.5 10*3/uL (ref 4.0–10.5)
nRBC: 0 % (ref 0.0–0.2)

## 2022-02-25 LAB — LIPASE, BLOOD: Lipase: 39 U/L (ref 11–51)

## 2022-02-25 LAB — COMPREHENSIVE METABOLIC PANEL
ALT: 10 U/L (ref 0–44)
AST: 25 U/L (ref 15–41)
Albumin: 3.1 g/dL — ABNORMAL LOW (ref 3.5–5.0)
Alkaline Phosphatase: 157 U/L — ABNORMAL HIGH (ref 38–126)
Anion gap: 8 (ref 5–15)
BUN: 14 mg/dL (ref 8–23)
CO2: 27 mmol/L (ref 22–32)
Calcium: 8.7 mg/dL — ABNORMAL LOW (ref 8.9–10.3)
Chloride: 102 mmol/L (ref 98–111)
Creatinine, Ser: 0.69 mg/dL (ref 0.61–1.24)
GFR, Estimated: >60 mL/min (ref >60)
Glucose, Bld: 121 mg/dL — ABNORMAL HIGH (ref 70–99)
Potassium: 4.9 mmol/L (ref 3.5–5.1)
Sodium: 137 mmol/L (ref 135–145)
Total Bilirubin: 0.3 mg/dL (ref 0.3–1.2)
Total Protein: 5.4 g/dL — ABNORMAL LOW (ref 6.5–8.1)

## 2022-02-25 NOTE — ED Provider Notes (Signed)
Supervised resident visit.  Patient here with possibly chest pain.  History of dementia.  Not a great historian.  On my interview patient complains of may be some low back pain but states that is chronic.  Overall vital signs are normal.  Patient very well-appearing.  No signs of distress.  Has history of seizure disorder, COPD but does not appear to have any significant cardiac history.  Sounds like he may be complained of chest pain after eating dinner tonight. ? ?Differential includes ACS, reflux, MSK pain.  EKG per my review and interpretation shows sinus rhythm.  No ischemic changes.  CBC, CMP, lipase, troponin ordered.  Chest x-ray ordered.  Per my review and interpretation of labs and imaging there is no significant anemia, electrolyte abnormality, kidney injury, leukocytosis.  Troponin is normal.  Chest x-ray without any evidence of pneumonia or pneumothorax.  Overall nonspecific story.  We will get second troponin and if normal will discharge back to facility.  He has been pain-free here without any complaints. ? ?This chart was dictated using voice recognition software.  Despite best efforts to proofread,  errors can occur which can change the documentation meaning.  ?  Lennice Sites, DO ?02/25/22 2254 ? ?

## 2022-02-25 NOTE — Discharge Instructions (Signed)
Please return to the Emergency Department if you develop severe persistent chest pain or severe shortness of breath. ?

## 2022-02-25 NOTE — ED Provider Notes (Signed)
?[facility] ?Provider Note ? ? ?CSN: [id number] ?Arrival date & time: 02/25/22  1928 ? ?  ? ?History ? ?Chief Complaint  ?Patient presents with  ? Back Pain  ? Chest Pain  ? ? ?[name] is a 88 y.o. male with a history of seizures, COPD, HLD, SVT, dementia, hypothyroidism presenting to the ED with chest pain.  Patient states that he noted a aching chest pain over the left side of his chest that started yesterday but is gradually worsened.  He states that the pain does radiate into his back, but no radiation into his left arm or left jaw.  Denies any associated nausea/vomiting, diaphoresis, shortness of breath, abdominal pain, dizziness, lightheadedness.  Per the EMS report, patient reportedly started complaining of pain after walking to dinner.  Prior to arrival, he received an aspirin load with EMS.  There is also a report of a possible recent fall and persistent back pain (patient reportedly had outside imaging that was negative).  On my examination, patient denies any injury or lower back pain. ? ? ?Back Pain ?Associated symptoms: chest pain   ?Associated symptoms: no abdominal pain and no fever   ?Chest Pain ?Associated symptoms: back pain (radiating form the chest)   ?Associated symptoms: no abdominal pain, no cough, no fever, no nausea, no palpitations, no shortness of breath and no vomiting   ? ?  ? ?Home Medications ?Prior to Admission medications   ?Medication Sig Start Date End Date Taking? Authorizing Provider  ?acetaminophen (TYLENOL) 650 MG CR tablet as needed. 12/05/20   [provider]  ?aspirin 81 MG EC tablet daily. 12/06/20   [provider]  ?atorvastatin (LIPITOR) 40 MG tablet Take 40 mg by mouth daily. ?Patient not taking: Reported on 02/21/2021    [provider]  ?bifidobacterium infantis (ALIGN) capsule daily. 12/05/20   [provider]  ?Cholecalciferol 50 MCG (2000 UT) CAPS daily. 12/05/20   [provider]   ?cromolyn (NASALCROM) 5.2 MG/ACT nasal spray in the morning and at bedtime. 12/05/20   [provider]  ?Cyanocobalamin (VITAMIN B-12 SL) Place 1 tablet under the tongue daily with breakfast. ?Patient not taking: Reported on 02/21/2021    [provider]  ?docusate sodium (COLACE) 100 MG capsule daily. 12/06/20   [provider]  ?donepezil (ARICEPT) 10 MG tablet Take by mouth.    [provider]  ?finasteride (PROSCAR) 5 MG tablet Take 5 mg by mouth daily.  10/07/14   [provider]  ?levETIRAcetam (KEPPRA) 100 MG/ML solution Take 7.5 mLs (750 mg total) by mouth at bedtime. 10/11/21   Penumalli, Earlean Polka, MD  ?levothyroxine (SYNTHROID) 75 MCG tablet daily. 12/06/20   [provider]  ?loperamide (IMODIUM A-D) 2 MG tablet Take 2 mg by mouth daily as needed for diarrhea or loose stools. ?Patient not taking: Reported on 02/21/2021    [provider]  ?memantine (NAMENDA) 5 MG tablet Take 10 mg by mouth 2 (two) times daily.    [provider]  ?metoprolol succinate (TOPROL-XL) 25 MG 24 hr tablet daily. 12/06/20   [provider]  ?Multiple Vitamin (MULTIVITAMIN) tablet Take 1 tablet by mouth daily.   ?Patient not taking: Reported on 02/21/2021    [provider]  ?omeprazole (PRILOSEC) 20 MG capsule Take 20 mg by mouth at bedtime.  ?Patient not taking: Reported on 02/21/2021    [provider]  ?polyethylene glycol powder (GLYCOLAX/MIRALAX) 17 GM/SCOOP powder as needed. 12/05/20  [provider]  ?pravastatin (PRAVACHOL) 20 MG tablet Take 20 mg by mouth at bedtime.    [provider]  ?predniSONE (DELTASONE) 10 MG tablet Take 10 mg by mouth daily with breakfast. ?Patient not taking: Reported on 02/21/2021    [provider]  ?traMADol (ULTRAM) 50 MG tablet Take 50 mg by mouth every 6 (six) hours as needed (for pain). ?Patient not taking: Reported on 02/21/2021    [provider]  ?   ? ?Allergies     ?Dilantin [phenytoin sodium extended], Lamotrigine, Phenytoin sodium extended, Sulfa antibiotics, Sulfamethoxazole, and Sulfonamide derivatives   ? ?Review of Systems   ?Review of Systems  ?Constitutional:  Negative for fever.  ?Respiratory:  Negative for cough and shortness of breath.   ?Cardiovascular:  Positive for chest pain. Negative for palpitations and leg swelling.  ?Gastrointestinal:  Negative for abdominal pain, diarrhea, nausea and vomiting.  ?Musculoskeletal:  Positive for back pain (radiating form the chest).  ?Skin:  Negative for rash.  ?Neurological:  Negative for light-headedness.  ? ?Physical Exam ?Updated Vital Signs ?BP 108/68   Pulse 78   Temp 97.9 ?F (36.6 ?C) (Oral)   Resp (!) 22   SpO2 93%  ?Physical Exam ?Vitals and nursing note reviewed.  ?Constitutional:   ?   General: He is not in acute distress. ?   Appearance: He is not toxic-appearing or diaphoretic.  ?   Comments: Elderly chronically ill-appearing.  ?HENT:  ?   Head: Normocephalic and atraumatic.  ?Eyes:  ?   Conjunctiva/sclera: Conjunctivae normal.  ?Cardiovascular:  ?   Rate and Rhythm: Normal rate and regular rhythm.  ?   Heart sounds: Normal heart sounds. No murmur heard. ?  No friction rub. No gallop.  ?Pulmonary:  ?   Effort: Pulmonary effort is normal. No tachypnea or respiratory distress.  ?   Breath sounds: No stridor. Examination of the right-middle field reveals decreased breath sounds. Examination of the right-lower field reveals decreased breath sounds. Decreased breath sounds (mild) present. No wheezing, rhonchi or rales.  ?Chest:  ?   Chest wall: No deformity, tenderness or crepitus.  ?Abdominal:  ?   Palpations: Abdomen is soft.  ?   Tenderness: There is no abdominal tenderness. There is no guarding or rebound.  ?Musculoskeletal:     ?   General: No swelling.  ?   Cervical back: Neck supple.  ?   Right lower leg: No tenderness. No edema.  ?   Left lower leg: No tenderness. No edema.  ?Skin: ?   General: Skin is  warm and dry.  ?   Capillary Refill: Capillary refill takes less than 2 seconds.  ?Neurological:  ?   General: No focal deficit present.  ?   Mental Status: He is alert.  ?Psychiatric:     ?   Mood and Affect: Mood normal.  ? ? ?ED Results / Procedures / Treatments   ?Labs ?(all labs ordered are listed, but only abnormal results are displayed) ?Labs Reviewed  ?COMPREHENSIVE METABOLIC PANEL - Abnormal; Notable for the following components:  ?    Result Value  ? Glucose, Bld 121 (*)   ? Calcium 8.7 (*)   ? Total Protein 5.4 (*)   ? Albumin 3.1 (*)   ? Alkaline Phosphatase 157 (*)   ? All other components within normal limits  ?CBC WITH DIFFERENTIAL/PLATELET - Abnormal; Notable for the following components:  ? RBC 4.03 (*)   ? Hemoglobin 12.5 (*)   ?  HCT 38.5 (*)   ? All other components within normal limits  ?LIPASE, BLOOD  ?TROPONIN I (HIGH SENSITIVITY)  ?TROPONIN I (HIGH SENSITIVITY)  ? ? ?EKG ?EKG Interpretation ? ?Date/Time:  Monday Feb 25 2022 73:71:06 EDT ?Ventricular Rate:  78 ?PR Interval:  136 ?QRS Duration: 93 ?QT Interval:  376 ?QTC Calculation: 429 ?R Axis:   -29 ?Text Interpretation: Sinus rhythm Borderline left axis deviation Borderline T abnormalities, lateral leads Confirmed by Ronnald Nian, Adam ([id number]) on 02/25/2022 9:04:12 PM ? ?Radiology ?DG Chest Portable 1 View ? ?Result Date: 02/25/2022 ?CLINICAL DATA:  Chest pain. EXAM: PORTABLE CHEST 1 VIEW COMPARISON:  Chest two views 04/20/2018 FINDINGS: There are moderately decreased lung volumes worsened from prior. Cardiac silhouette appears mildly enlarged. Mediastinal contours are within normal limits. Bibasilar bronchovascular crowding. No definite focal airspace opacity to indicate pneumonia. No definite pleural effusion or pneumothorax. Mild dextrocurvature of the mid to upper thoracic spine and mild levocurvature of the lower thoracic spine with moderate multilevel degenerative disc changes. Severe right and moderate left glenohumeral osteoarthritis.  IMPRESSION: Moderately decreased lung volumes with bibasilar atelectasis. No definite pneumonia. Electronically Signed   By: Yvonne Kendall M.D.   On: 02/25/2022 20:18   ? ?Procedures ?.EKG ? ?Date/Time: 02/25/2022 11:28 P

## 2022-02-25 NOTE — ED Triage Notes (Signed)
Pt bib [facility] from [facility] c/o CP when walking back to room after dinner. Pt given 425 ASA by EMS with relief. Pt also c/o of generalized back pain for past few days. Facility suspected fall, imaging negative. Hx alzheimer's, COPD ?

## 2022-02-26 DIAGNOSIS — M6281 Muscle weakness (generalized): Secondary | ICD-10-CM | POA: Diagnosis not present

## 2022-02-26 DIAGNOSIS — Z20822 Contact with and (suspected) exposure to covid-19: Secondary | ICD-10-CM | POA: Diagnosis not present

## 2022-02-26 DIAGNOSIS — U071 COVID-19: Secondary | ICD-10-CM | POA: Diagnosis not present

## 2022-02-26 DIAGNOSIS — R2689 Other abnormalities of gait and mobility: Secondary | ICD-10-CM | POA: Diagnosis not present

## 2022-02-26 DIAGNOSIS — M545 Low back pain, unspecified: Secondary | ICD-10-CM | POA: Diagnosis not present

## 2022-02-26 DIAGNOSIS — Z9181 History of falling: Secondary | ICD-10-CM | POA: Diagnosis not present

## 2022-02-26 DIAGNOSIS — M47817 Spondylosis without myelopathy or radiculopathy, lumbosacral region: Secondary | ICD-10-CM | POA: Diagnosis not present

## 2022-02-26 NOTE — ED Notes (Signed)
RN reviewed discharge instructions with pt and son. Both verbalized understanding and had no further questions. VSS upon discharge. Pt transported to [facility] via son. ?

## 2022-02-27 DIAGNOSIS — Z9181 History of falling: Secondary | ICD-10-CM | POA: Diagnosis not present

## 2022-02-27 DIAGNOSIS — M6281 Muscle weakness (generalized): Secondary | ICD-10-CM | POA: Diagnosis not present

## 2022-02-27 DIAGNOSIS — R2689 Other abnormalities of gait and mobility: Secondary | ICD-10-CM | POA: Diagnosis not present

## 2022-02-27 DIAGNOSIS — M47817 Spondylosis without myelopathy or radiculopathy, lumbosacral region: Secondary | ICD-10-CM | POA: Diagnosis not present

## 2022-02-27 DIAGNOSIS — M545 Low back pain, unspecified: Secondary | ICD-10-CM | POA: Diagnosis not present

## 2022-02-28 DIAGNOSIS — M6281 Muscle weakness (generalized): Secondary | ICD-10-CM | POA: Diagnosis not present

## 2022-02-28 DIAGNOSIS — M545 Low back pain, unspecified: Secondary | ICD-10-CM | POA: Diagnosis not present

## 2022-02-28 DIAGNOSIS — F321 Major depressive disorder, single episode, moderate: Secondary | ICD-10-CM | POA: Diagnosis not present

## 2022-02-28 DIAGNOSIS — Z9181 History of falling: Secondary | ICD-10-CM | POA: Diagnosis not present

## 2022-02-28 DIAGNOSIS — R2689 Other abnormalities of gait and mobility: Secondary | ICD-10-CM | POA: Diagnosis not present

## 2022-02-28 DIAGNOSIS — M47817 Spondylosis without myelopathy or radiculopathy, lumbosacral region: Secondary | ICD-10-CM | POA: Diagnosis not present

## 2022-03-01 DIAGNOSIS — R2689 Other abnormalities of gait and mobility: Secondary | ICD-10-CM | POA: Diagnosis not present

## 2022-03-01 DIAGNOSIS — F321 Major depressive disorder, single episode, moderate: Secondary | ICD-10-CM | POA: Diagnosis not present

## 2022-03-01 DIAGNOSIS — G309 Alzheimer's disease, unspecified: Secondary | ICD-10-CM | POA: Diagnosis not present

## 2022-03-01 DIAGNOSIS — M545 Low back pain, unspecified: Secondary | ICD-10-CM | POA: Diagnosis not present

## 2022-03-01 DIAGNOSIS — F03B3 Unspecified dementia, moderate, with mood disturbance: Secondary | ICD-10-CM | POA: Diagnosis not present

## 2022-03-01 DIAGNOSIS — M47817 Spondylosis without myelopathy or radiculopathy, lumbosacral region: Secondary | ICD-10-CM | POA: Diagnosis not present

## 2022-03-01 DIAGNOSIS — M6281 Muscle weakness (generalized): Secondary | ICD-10-CM | POA: Diagnosis not present

## 2022-03-01 DIAGNOSIS — Z9181 History of falling: Secondary | ICD-10-CM | POA: Diagnosis not present

## 2022-03-04 DIAGNOSIS — M47817 Spondylosis without myelopathy or radiculopathy, lumbosacral region: Secondary | ICD-10-CM | POA: Diagnosis not present

## 2022-03-04 DIAGNOSIS — R2689 Other abnormalities of gait and mobility: Secondary | ICD-10-CM | POA: Diagnosis not present

## 2022-03-04 DIAGNOSIS — M6281 Muscle weakness (generalized): Secondary | ICD-10-CM | POA: Diagnosis not present

## 2022-03-04 DIAGNOSIS — Z9181 History of falling: Secondary | ICD-10-CM | POA: Diagnosis not present

## 2022-03-04 DIAGNOSIS — M545 Low back pain, unspecified: Secondary | ICD-10-CM | POA: Diagnosis not present

## 2022-03-05 DIAGNOSIS — Z9181 History of falling: Secondary | ICD-10-CM | POA: Diagnosis not present

## 2022-03-05 DIAGNOSIS — M545 Low back pain, unspecified: Secondary | ICD-10-CM | POA: Diagnosis not present

## 2022-03-05 DIAGNOSIS — R2689 Other abnormalities of gait and mobility: Secondary | ICD-10-CM | POA: Diagnosis not present

## 2022-03-05 DIAGNOSIS — M47817 Spondylosis without myelopathy or radiculopathy, lumbosacral region: Secondary | ICD-10-CM | POA: Diagnosis not present

## 2022-03-05 DIAGNOSIS — M6281 Muscle weakness (generalized): Secondary | ICD-10-CM | POA: Diagnosis not present

## 2022-03-07 DIAGNOSIS — M6281 Muscle weakness (generalized): Secondary | ICD-10-CM | POA: Diagnosis not present

## 2022-03-07 DIAGNOSIS — R2689 Other abnormalities of gait and mobility: Secondary | ICD-10-CM | POA: Diagnosis not present

## 2022-03-07 DIAGNOSIS — M545 Low back pain, unspecified: Secondary | ICD-10-CM | POA: Diagnosis not present

## 2022-03-07 DIAGNOSIS — Z9181 History of falling: Secondary | ICD-10-CM | POA: Diagnosis not present

## 2022-03-07 DIAGNOSIS — M47817 Spondylosis without myelopathy or radiculopathy, lumbosacral region: Secondary | ICD-10-CM | POA: Diagnosis not present

## 2022-03-07 DIAGNOSIS — F321 Major depressive disorder, single episode, moderate: Secondary | ICD-10-CM | POA: Diagnosis not present

## 2022-03-11 DIAGNOSIS — M6281 Muscle weakness (generalized): Secondary | ICD-10-CM | POA: Diagnosis not present

## 2022-03-11 DIAGNOSIS — Z9181 History of falling: Secondary | ICD-10-CM | POA: Diagnosis not present

## 2022-03-11 DIAGNOSIS — M47817 Spondylosis without myelopathy or radiculopathy, lumbosacral region: Secondary | ICD-10-CM | POA: Diagnosis not present

## 2022-03-11 DIAGNOSIS — M545 Low back pain, unspecified: Secondary | ICD-10-CM | POA: Diagnosis not present

## 2022-03-11 DIAGNOSIS — R2689 Other abnormalities of gait and mobility: Secondary | ICD-10-CM | POA: Diagnosis not present

## 2022-03-12 DIAGNOSIS — Z9181 History of falling: Secondary | ICD-10-CM | POA: Diagnosis not present

## 2022-03-12 DIAGNOSIS — M545 Low back pain, unspecified: Secondary | ICD-10-CM | POA: Diagnosis not present

## 2022-03-12 DIAGNOSIS — R2689 Other abnormalities of gait and mobility: Secondary | ICD-10-CM | POA: Diagnosis not present

## 2022-03-12 DIAGNOSIS — M6281 Muscle weakness (generalized): Secondary | ICD-10-CM | POA: Diagnosis not present

## 2022-03-12 DIAGNOSIS — M47817 Spondylosis without myelopathy or radiculopathy, lumbosacral region: Secondary | ICD-10-CM | POA: Diagnosis not present

## 2022-03-14 ENCOUNTER — Emergency Department (HOSPITAL_COMMUNITY)
Admission: EM | Admit: 2022-03-14 | Discharge: 2022-03-15 | Disposition: A | Discharge status: Home / Self Care | Payer: Medicare Other | Attending: Emergency Medicine | Admitting: Emergency Medicine

## 2022-03-14 ENCOUNTER — Emergency Department (HOSPITAL_BASED_OUTPATIENT_CLINIC_OR_DEPARTMENT_OTHER): Payer: Medicare Other

## 2022-03-14 ENCOUNTER — Emergency Department (HOSPITAL_COMMUNITY): Payer: Medicare Other

## 2022-03-14 DIAGNOSIS — Z9181 History of falling: Secondary | ICD-10-CM | POA: Diagnosis not present

## 2022-03-14 DIAGNOSIS — S80211A Abrasion, right knee, initial encounter: Secondary | ICD-10-CM | POA: Insufficient documentation

## 2022-03-14 DIAGNOSIS — X58XXXA Exposure to other specified factors, initial encounter: Secondary | ICD-10-CM | POA: Diagnosis not present

## 2022-03-14 DIAGNOSIS — M79604 Pain in right leg: Secondary | ICD-10-CM | POA: Insufficient documentation

## 2022-03-14 DIAGNOSIS — Z7982 Long term (current) use of aspirin: Secondary | ICD-10-CM | POA: Diagnosis not present

## 2022-03-14 DIAGNOSIS — M47817 Spondylosis without myelopathy or radiculopathy, lumbosacral region: Secondary | ICD-10-CM | POA: Diagnosis not present

## 2022-03-14 DIAGNOSIS — S72001A Fracture of unspecified part of neck of right femur, initial encounter for closed fracture: Secondary | ICD-10-CM | POA: Diagnosis not present

## 2022-03-14 DIAGNOSIS — J449 Chronic obstructive pulmonary disease, unspecified: Secondary | ICD-10-CM | POA: Diagnosis not present

## 2022-03-14 DIAGNOSIS — M9701XA Periprosthetic fracture around internal prosthetic right hip joint, initial encounter: Secondary | ICD-10-CM | POA: Diagnosis not present

## 2022-03-14 DIAGNOSIS — F0390 Unspecified dementia without behavioral disturbance: Secondary | ICD-10-CM | POA: Insufficient documentation

## 2022-03-14 DIAGNOSIS — S8991XA Unspecified injury of right lower leg, initial encounter: Secondary | ICD-10-CM | POA: Diagnosis present

## 2022-03-14 DIAGNOSIS — I959 Hypotension, unspecified: Secondary | ICD-10-CM | POA: Diagnosis not present

## 2022-03-14 DIAGNOSIS — R2689 Other abnormalities of gait and mobility: Secondary | ICD-10-CM | POA: Diagnosis not present

## 2022-03-14 DIAGNOSIS — R102 Pelvic and perineal pain: Secondary | ICD-10-CM | POA: Diagnosis not present

## 2022-03-14 DIAGNOSIS — M6281 Muscle weakness (generalized): Secondary | ICD-10-CM | POA: Diagnosis not present

## 2022-03-14 DIAGNOSIS — M545 Low back pain, unspecified: Secondary | ICD-10-CM | POA: Diagnosis not present

## 2022-03-14 LAB — BASIC METABOLIC PANEL
Anion gap: 6 (ref 5–15)
BUN: 16 mg/dL (ref 8–23)
CO2: 28 mmol/L (ref 22–32)
Calcium: 8.5 mg/dL — ABNORMAL LOW (ref 8.9–10.3)
Chloride: 105 mmol/L (ref 98–111)
Creatinine, Ser: 0.58 mg/dL — ABNORMAL LOW (ref 0.61–1.24)
GFR, Estimated: >60 mL/min (ref >60)
Glucose, Bld: 106 mg/dL — ABNORMAL HIGH (ref 70–99)
Potassium: 4.1 mmol/L (ref 3.5–5.1)
Sodium: 139 mmol/L (ref 135–145)

## 2022-03-14 LAB — CBC
HCT: 36.8 % — ABNORMAL LOW (ref 39.0–52.0)
Hemoglobin: 11.4 g/dL — ABNORMAL LOW (ref 13.0–17.0)
MCH: 30.6 pg (ref 26.0–34.0)
MCHC: 31 g/dL (ref 30.0–36.0)
MCV: 98.7 fL (ref 80.0–100.0)
Platelets: 177 10*3/uL (ref 150–400)
RBC: 3.73 MIL/uL — ABNORMAL LOW (ref 4.22–5.81)
RDW: 14.5 % (ref 11.5–15.5)
WBC: 5.3 10*3/uL (ref 4.0–10.5)
nRBC: 0 % (ref 0.0–0.2)

## 2022-03-14 LAB — CK: Total CK: 66 U/L (ref 49–397)

## 2022-03-14 MED ORDER — HYDROCODONE-ACETAMINOPHEN 5-325 MG PO TABS: 1.0000 | ORAL_TABLET | Freq: Four times a day (QID) | ORAL | 0 refills | Status: AC | PRN

## 2022-03-14 MED ORDER — MORPHINE SULFATE (PF) 2 MG/ML IV SOLN
2.0000 mg | Freq: Once | INTRAVENOUS | Status: AC
  Administered 2022-03-14: 2 mg via INTRAVENOUS
  Filled 2022-03-14: qty 1

## 2022-03-14 MED ORDER — LEVETIRACETAM 100 MG/ML PO SOLN
750.0000 mg | Freq: Every day | ORAL | Status: DC
Start: 2022-03-14 — End: 2022-03-15
  Administered 2022-03-14: 750 mg via ORAL
  Filled 2022-03-14 (×2): qty 10

## 2022-03-14 MED ORDER — ACETAMINOPHEN 500 MG PO TABS
500.0000 mg | ORAL_TABLET | Freq: Four times a day (QID) | ORAL | Status: DC | PRN
  Administered 2022-03-14: 500 mg via ORAL
  Filled 2022-03-14: qty 1

## 2022-03-14 MED ORDER — LEVOTHYROXINE SODIUM 75 MCG PO TABS
75.0000 ug | ORAL_TABLET | Freq: Every day | ORAL | Status: DC
  Administered 2022-03-15: 75 ug via ORAL
  Filled 2022-03-14: qty 1

## 2022-03-14 MED ORDER — MEMANTINE HCL 10 MG PO TABS
10.0000 mg | ORAL_TABLET | Freq: Two times a day (BID) | ORAL | Status: DC
Start: 2022-03-14 — End: 2022-03-15
  Administered 2022-03-14 – 2022-03-15 (×2): 10 mg via ORAL
  Filled 2022-03-14 (×3): qty 1

## 2022-03-14 MED ORDER — MIRTAZAPINE 7.5 MG PO TABS
7.5000 mg | ORAL_TABLET | Freq: Every day | ORAL | Status: DC
Start: 2022-03-14 — End: 2022-03-15
  Administered 2022-03-14: 7.5 mg via ORAL
  Filled 2022-03-14 (×2): qty 1

## 2022-03-14 NOTE — Discharge Instructions (Signed)
Please be sure to follow-up with your orthopedic physician.  Return here for concerning changes in your condition.  With your periprosthetic fracture you have been prescribed a narcotic pain medication.  Use this only if Tylenol and ibuprofen are not sufficient for pain control and preferably earlier in the day to minimize risk of worsening confusion in the evenings and overnight.

## 2022-03-14 NOTE — ED Provider Notes (Addendum)
Care of the patient assumed at signout.  5:29 PM Patient in no distress, discussed orthopedic recommendations and lab results with the patient's son who is comfortable taking the patient back to his skilled nursing level of care at his nursing home.  A skilled nursing bed may not be available until tomorrow.   Carmin Muskrat, MD 03/14/22 1729    Carmin Muskrat, MD 03/14/22 1735   10:33 PM Efforts to complete medication reconciliation have been ongoing.  Assistance of pharmacy techs appreciated.  In the interim, necessary home meds have been ordered.   Carmin Muskrat, MD 03/14/22 2233

## 2022-03-14 NOTE — ED Triage Notes (Signed)
Pt bib [facility] from [facility] with complaints of right thigh pain that started two days ago with no injury. Pt woke up today with right thigh tenderness. Pt normally uses walker and denies recent falls. Pt has dementia at baseline but is AOx4. EMS vitals: 106SBP 70HR

## 2022-03-14 NOTE — ED Notes (Signed)
Pt was trying to get out of bed, reminded pt that his leg is broken and he can't get up right now, pt stated "my leg is not broken I walk everyday" explained to pt that he is in the hospital because he broke his leg today, pt stated "It's not broken I can walk" assisted pt with urinal, re enforced education about not getting up and calling for help

## 2022-03-14 NOTE — ED Provider Notes (Addendum)
[facility] EMERGENCY DEPARTMENT Provider Note   CSN: [id number] Arrival date & time: 03/14/22  1405     History  Chief complaint: Thigh pain  [name] is a 88 y.o. male.  HPI  Patient has history of seizure disorder, reflux, dementia, epilepsy, arthritis, COPD who presents to the ED with complaints of right thigh pain.  Patient initially said he was not sure why he was here but when I asked him about his thigh hurting him he admitted that he was having pain in his right thigh.  There is no known acute fall or injury reported.  No known fevers.  Additional history was provided by family members and the patient has been having pain in his thigh especially when he is trying to sit up.  He seems to be fine when he is lying flat.  Home Medications Prior to Admission medications   Medication Sig Start Date End Date Taking? Authorizing Provider  acetaminophen (TYLENOL) 650 MG CR tablet as needed. 12/05/20   [provider]  aspirin 81 MG EC tablet daily. 12/06/20   [provider]  atorvastatin (LIPITOR) 40 MG tablet Take 40 mg by mouth daily. Patient not taking: Reported on 02/21/2021    [provider]  bifidobacterium infantis (ALIGN) capsule daily. 12/05/20   [provider]  Cholecalciferol 50 MCG (2000 UT) CAPS daily. 12/05/20   [provider]  cromolyn (NASALCROM) 5.2 MG/ACT nasal spray in the morning and at bedtime. 12/05/20   [provider]  Cyanocobalamin (VITAMIN B-12 SL) Place 1 tablet under the tongue daily with breakfast. Patient not taking: Reported on 02/21/2021    [provider]  docusate sodium (COLACE) 100 MG capsule daily. 12/06/20   [provider]  donepezil (ARICEPT) 10 MG tablet Take by mouth.    [provider]  finasteride (PROSCAR) 5 MG tablet Take 5 mg by mouth daily.  10/07/14   [provider]  levETIRAcetam (KEPPRA) 100 MG/ML solution Take 7.5 mLs (750 mg  total) by mouth at bedtime. 10/11/21   Penumalli, Earlean Polka, MD  levothyroxine (SYNTHROID) 75 MCG tablet daily. 12/06/20   [provider]  loperamide (IMODIUM A-D) 2 MG tablet Take 2 mg by mouth daily as needed for diarrhea or loose stools. Patient not taking: Reported on 02/21/2021    [provider]  memantine (NAMENDA) 5 MG tablet Take 10 mg by mouth 2 (two) times daily.    [provider]  metoprolol succinate (TOPROL-XL) 25 MG 24 hr tablet daily. 12/06/20   [provider]  Multiple Vitamin (MULTIVITAMIN) tablet Take 1 tablet by mouth daily.   Patient not taking: Reported on 02/21/2021    [provider]  omeprazole (PRILOSEC) 20 MG capsule Take 20 mg by mouth at bedtime.  Patient not taking: Reported on 02/21/2021    [provider]  polyethylene glycol powder (GLYCOLAX/MIRALAX) 17 GM/SCOOP powder as needed. 12/05/20   [provider]  pravastatin (PRAVACHOL) 20 MG tablet Take 20 mg by mouth at bedtime.    [provider]  predniSONE (DELTASONE) 10 MG tablet Take 10 mg by mouth daily with breakfast. Patient not taking: Reported on 02/21/2021    [provider]  traMADol (ULTRAM) 50 MG tablet Take 50 mg by mouth every 6 (six) hours as needed (for pain). Patient not taking: Reported on 02/21/2021    [provider]      Allergies    Dilantin [phenytoin sodium extended], Lamotrigine, Phenytoin  sodium extended, Sulfa antibiotics, Sulfamethoxazole, and Sulfonamide derivatives    Review of Systems   Review of Systems  Constitutional:  Negative for fever.   Physical Exam Updated Vital Signs BP (!) 101/59   Pulse 76   Temp 98 F (36.7 C)   Resp 16   SpO2 94%  Physical Exam Vitals and nursing note reviewed.  Constitutional:      Appearance: He is well-developed. He is not diaphoretic.  HENT:     Head: Normocephalic and atraumatic.     Right Ear: External ear normal.     Left Ear: External ear normal.   Eyes:     General: No scleral icterus.       Right eye: No discharge.        Left eye: No discharge.     Conjunctiva/sclera: Conjunctivae normal.  Neck:     Trachea: No tracheal deviation.  Cardiovascular:     Rate and Rhythm: Normal rate and regular rhythm.  Pulmonary:     Effort: Pulmonary effort is normal. No respiratory distress.     Breath sounds: Normal breath sounds. No stridor. No wheezing or rales.  Abdominal:     General: Bowel sounds are normal. There is no distension.     Palpations: Abdomen is soft.     Tenderness: There is no abdominal tenderness. There is no guarding or rebound.  Musculoskeletal:        General: Tenderness present. No deformity.     Cervical back: Neck supple. No tenderness.     Thoracic back: No tenderness.     Lumbar back: No tenderness.     Right hip: No bony tenderness.     Right upper leg: Tenderness present. No swelling or deformity.     Comments: Small abrasion noted lateral aspect right knee  Skin:    General: Skin is warm and dry.     Findings: No rash.  Neurological:     General: No focal deficit present.     Mental Status: He is alert.     Cranial Nerves: No cranial nerve deficit (no facial droop, extraocular movements intact, no slurred speech).     Sensory: No sensory deficit.     Motor: No abnormal muscle tone or seizure activity.     Coordination: Coordination normal.  Psychiatric:        Mood and Affect: Mood normal.    ED Results / Procedures / Treatments   Labs (all labs ordered are listed, but only abnormal results are displayed) Labs Reviewed  CBC  BASIC METABOLIC PANEL  CK    EKG None  Radiology DG Pelvis 1-2 Views  Result Date: 03/14/2022 CLINICAL DATA:  Pain proximal right femur EXAM: PELVIS - 1-2 VIEW COMPARISON:  06/27/2016 FINDINGS: Right hip replacement. Acute fracture proximal right femur around the femoral prosthesis. This is better evaluated on the right femur study. No other fracture in the pelvis.  Left hip joint space normal. IMPRESSION: Periprosthetic fracture proximal right femur. Electronically Signed   By: Franchot Gallo M.D.   On: 03/14/2022 15:06   DG Femur Min 2 Views Right  Result Date: 03/14/2022 CLINICAL DATA:  Pain proximal femur EXAM: RIGHT FEMUR 2 VIEWS COMPARISON:  None Available. FINDINGS: Right hip replacement. Periprosthetic fracture proximal right femur with mild displacement. No other acute fracture Extensive arterial calcification.  Degenerative change in the knee. IMPRESSION: Right hip replacement. Periprosthetic fracture proximal right femur. Electronically Signed   By: Franchot Gallo M.D.   On: 03/14/2022 15:05  VAS [place] LOWER EXTREMITY VENOUS (DVT) (7a-7p)  Result Date: 03/14/2022  Lower Venous DVT Study Patient Name:  [name]  Date of Exam:   03/14/2022 Medical Rec #: [id number]      Accession #:    [id number] Date of Birth: [birth year]       Patient Gender: M Patient Age:   43 years Exam Location:  [facility] Procedure:      VAS [place] LOWER EXTREMITY VENOUS (DVT) Referring Phys: Tamim Skog --------------------------------------------------------------------------------  Indications: Pain.  Risk Factors: None identified. Limitations: Patient positioning, patient immobility, patient pain tolerance. Comparison Study: No prior studies. Performing Technologist: Oliver Hum RVT  Examination Guidelines: A complete evaluation includes B-mode imaging, spectral Doppler, color Doppler, and power Doppler as needed of all accessible portions of each vessel. Bilateral testing is considered an integral part of a complete examination. Limited examinations for reoccurring indications may be performed as noted. The reflux portion of the exam is performed with the patient in reverse Trendelenburg.  +---------+---------------+---------+-----------+----------+-------------------+ RIGHT    CompressibilityPhasicitySpontaneityPropertiesThrombus Aging       +---------+---------------+---------+-----------+----------+-------------------+ CFV      Full           Yes      Yes                                      +---------+---------------+---------+-----------+----------+-------------------+ SFJ      Full                                                             +---------+---------------+---------+-----------+----------+-------------------+ FV Prox  Full                                                             +---------+---------------+---------+-----------+----------+-------------------+ FV Mid   Full                                                             +---------+---------------+---------+-----------+----------+-------------------+ FV DistalFull                                                             +---------+---------------+---------+-----------+----------+-------------------+ PFV      Full                                                             +---------+---------------+---------+-----------+----------+-------------------+ POP      Full           Yes  Yes                                      +---------+---------------+---------+-----------+----------+-------------------+ PTV      Full                                                             +---------+---------------+---------+-----------+----------+-------------------+ PERO                                                  Not well visualized +---------+---------------+---------+-----------+----------+-------------------+   +----+---------------+---------+-----------+----------+--------------+ LEFTCompressibilityPhasicitySpontaneityPropertiesThrombus Aging +----+---------------+---------+-----------+----------+--------------+ CFV Full           Yes      Yes                                 +----+---------------+---------+-----------+----------+--------------+    Summary: RIGHT: - There is no evidence of deep  vein thrombosis in the lower extremity. However, portions of this examination were limited- see technologist comments above.  - No cystic structure found in the popliteal fossa.  LEFT: - No evidence of common femoral vein obstruction.  *See table(s) above for measurements and observations.    Preliminary     Procedures Procedures    Medications Ordered in ED Medications  morphine (PF) 2 MG/ML injection 2 mg (2 mg Intravenous Given 03/14/22 1512)    ED Course/ Medical Decision Making/ A&P Clinical Course as of 03/14/22 1534  Thu Mar 14, 2022  1522 VAS [place] LOWER EXTREMITY VENOUS (DVT) (7a-7p)  Doppler study without DVT [JK]  1523 X-ray images and radiology report reviewed.  Patient does have a periprosthetic fracture [JK]  1529 Prior records reviewed.  Patient had a right total hip procedure by Dr. Alvan Dame [JK]  1532 Case discussed with Hilbert Odor, orthopedics [JK]    Clinical Course User Index [JK] Dorie Rank, MD                           Medical Decision Making Problems Addressed: Dementia, unspecified dementia severity, unspecified dementia type, unspecified whether behavioral, psychotic, or mood disturbance or anxiety [facility]): chronic illness or injury Periprosthetic fracture around internal prosthetic right hip joint, initial encounter [facility]): acute illness or injury that poses a threat to life or bodily functions  Amount and/or Complexity of Data Reviewed Labs: ordered. Radiology: ordered and independent interpretation performed. Decision-making details documented in ED Course. Discussion of management or test interpretation with external provider(s): Discussed with orthopedics  Risk Prescription drug management. Parenteral controlled substances.   Patient presents ED with complaints of right thigh pain.  No known falls but the patient does have dementia.  On exam he had significant tenderness on the right side.  He does have an abrasion on that leg suggesting recent trauma.   X-rays demonstrate a periprosthetic fracture.  Labs are currently pending.  I discussed case with orthopedics.  Dr Heide Scales will follow up on ortho recommendations and dispo   Orthopedics reviewed the film.  Patient can weight-bear as tolerated.  He will need to follow-up as an outpatient.  No need for acute surgery       Final Clinical Impression(s) / ED Diagnoses Final diagnoses:  Dementia, unspecified dementia severity, unspecified dementia type, unspecified whether behavioral, psychotic, or mood disturbance or anxiety ([place])  Periprosthetic fracture around internal prosthetic right hip joint, initial encounter [facility])    Rx / DC Orders ED Discharge Orders     None         Dorie Rank, MD 03/14/22 1534    Dorie Rank, MD 03/14/22 1542

## 2022-03-14 NOTE — Progress Notes (Signed)
Right lower extremity venous duplex has been completed. Preliminary results can be found in CV Proc through chart review.  Results were given to Dr. Tomi Bamberger.  03/14/22 3:19 PM [name] RVT

## 2022-03-15 DIAGNOSIS — M9701XD Periprosthetic fracture around internal prosthetic right hip joint, subsequent encounter: Secondary | ICD-10-CM | POA: Diagnosis not present

## 2022-03-15 DIAGNOSIS — R41840 Attention and concentration deficit: Secondary | ICD-10-CM | POA: Diagnosis not present

## 2022-03-15 DIAGNOSIS — R41841 Cognitive communication deficit: Secondary | ICD-10-CM | POA: Diagnosis not present

## 2022-03-15 DIAGNOSIS — S80211A Abrasion, right knee, initial encounter: Secondary | ICD-10-CM | POA: Diagnosis not present

## 2022-03-15 DIAGNOSIS — G309 Alzheimer's disease, unspecified: Secondary | ICD-10-CM | POA: Diagnosis not present

## 2022-03-15 DIAGNOSIS — K219 Gastro-esophageal reflux disease without esophagitis: Secondary | ICD-10-CM | POA: Diagnosis not present

## 2022-03-15 DIAGNOSIS — M6389 Disorders of muscle in diseases classified elsewhere, multiple sites: Secondary | ICD-10-CM | POA: Diagnosis not present

## 2022-03-15 DIAGNOSIS — Z743 Need for continuous supervision: Secondary | ICD-10-CM | POA: Diagnosis not present

## 2022-03-15 DIAGNOSIS — S79929A Unspecified injury of unspecified thigh, initial encounter: Secondary | ICD-10-CM | POA: Diagnosis not present

## 2022-03-15 DIAGNOSIS — J449 Chronic obstructive pulmonary disease, unspecified: Secondary | ICD-10-CM | POA: Diagnosis not present

## 2022-03-15 DIAGNOSIS — Z9181 History of falling: Secondary | ICD-10-CM | POA: Diagnosis not present

## 2022-03-15 DIAGNOSIS — R410 Disorientation, unspecified: Secondary | ICD-10-CM | POA: Diagnosis not present

## 2022-03-15 DIAGNOSIS — W19XXXA Unspecified fall, initial encounter: Secondary | ICD-10-CM | POA: Diagnosis not present

## 2022-03-15 DIAGNOSIS — R1312 Dysphagia, oropharyngeal phase: Secondary | ICD-10-CM | POA: Diagnosis not present

## 2022-03-15 DIAGNOSIS — R2689 Other abnormalities of gait and mobility: Secondary | ICD-10-CM | POA: Diagnosis not present

## 2022-03-15 DIAGNOSIS — M6281 Muscle weakness (generalized): Secondary | ICD-10-CM | POA: Diagnosis not present

## 2022-03-15 DIAGNOSIS — K2270 Barrett's esophagus without dysplasia: Secondary | ICD-10-CM | POA: Diagnosis not present

## 2022-03-15 NOTE — ED Notes (Addendum)
Dian Situ styles: [phone] office Work phone [phone] (call/text) cell [phone]  Care coodinator at [facility]

## 2022-03-16 DIAGNOSIS — J449 Chronic obstructive pulmonary disease, unspecified: Secondary | ICD-10-CM | POA: Diagnosis not present

## 2022-03-16 DIAGNOSIS — Z9181 History of falling: Secondary | ICD-10-CM | POA: Diagnosis not present

## 2022-03-16 DIAGNOSIS — K219 Gastro-esophageal reflux disease without esophagitis: Secondary | ICD-10-CM | POA: Diagnosis not present

## 2022-03-16 DIAGNOSIS — M9701XD Periprosthetic fracture around internal prosthetic right hip joint, subsequent encounter: Secondary | ICD-10-CM | POA: Diagnosis not present

## 2022-03-16 DIAGNOSIS — G309 Alzheimer's disease, unspecified: Secondary | ICD-10-CM | POA: Diagnosis not present

## 2022-03-16 DIAGNOSIS — K2270 Barrett's esophagus without dysplasia: Secondary | ICD-10-CM | POA: Diagnosis not present

## 2022-03-18 DIAGNOSIS — J449 Chronic obstructive pulmonary disease, unspecified: Secondary | ICD-10-CM | POA: Diagnosis not present

## 2022-03-18 DIAGNOSIS — M9701XD Periprosthetic fracture around internal prosthetic right hip joint, subsequent encounter: Secondary | ICD-10-CM | POA: Diagnosis not present

## 2022-03-18 DIAGNOSIS — G309 Alzheimer's disease, unspecified: Secondary | ICD-10-CM | POA: Diagnosis not present

## 2022-03-18 DIAGNOSIS — K2270 Barrett's esophagus without dysplasia: Secondary | ICD-10-CM | POA: Diagnosis not present

## 2022-03-18 DIAGNOSIS — K219 Gastro-esophageal reflux disease without esophagitis: Secondary | ICD-10-CM | POA: Diagnosis not present

## 2022-03-18 DIAGNOSIS — Z9181 History of falling: Secondary | ICD-10-CM | POA: Diagnosis not present

## 2022-03-19 DIAGNOSIS — K2270 Barrett's esophagus without dysplasia: Secondary | ICD-10-CM | POA: Diagnosis not present

## 2022-03-19 DIAGNOSIS — I959 Hypotension, unspecified: Secondary | ICD-10-CM | POA: Diagnosis not present

## 2022-03-19 DIAGNOSIS — F0280 Dementia in other diseases classified elsewhere without behavioral disturbance: Secondary | ICD-10-CM | POA: Diagnosis not present

## 2022-03-19 DIAGNOSIS — M9701XD Periprosthetic fracture around internal prosthetic right hip joint, subsequent encounter: Secondary | ICD-10-CM | POA: Diagnosis not present

## 2022-03-19 DIAGNOSIS — M978XXA Periprosthetic fracture around other internal prosthetic joint, initial encounter: Secondary | ICD-10-CM | POA: Diagnosis not present

## 2022-03-19 DIAGNOSIS — Z9181 History of falling: Secondary | ICD-10-CM | POA: Diagnosis not present

## 2022-03-19 DIAGNOSIS — J449 Chronic obstructive pulmonary disease, unspecified: Secondary | ICD-10-CM | POA: Diagnosis not present

## 2022-03-19 DIAGNOSIS — E639 Nutritional deficiency, unspecified: Secondary | ICD-10-CM | POA: Diagnosis not present

## 2022-03-19 DIAGNOSIS — G309 Alzheimer's disease, unspecified: Secondary | ICD-10-CM | POA: Diagnosis not present

## 2022-03-19 DIAGNOSIS — K219 Gastro-esophageal reflux disease without esophagitis: Secondary | ICD-10-CM | POA: Diagnosis not present

## 2022-03-19 DIAGNOSIS — E039 Hypothyroidism, unspecified: Secondary | ICD-10-CM | POA: Diagnosis not present

## 2022-03-19 DIAGNOSIS — R634 Abnormal weight loss: Secondary | ICD-10-CM | POA: Diagnosis not present

## 2022-03-19 DIAGNOSIS — Z96649 Presence of unspecified artificial hip joint: Secondary | ICD-10-CM | POA: Diagnosis not present

## 2022-03-19 DIAGNOSIS — R569 Unspecified convulsions: Secondary | ICD-10-CM | POA: Diagnosis not present

## 2022-03-19 DIAGNOSIS — I471 Supraventricular tachycardia: Secondary | ICD-10-CM | POA: Diagnosis not present

## 2022-03-19 DIAGNOSIS — E46 Unspecified protein-calorie malnutrition: Secondary | ICD-10-CM | POA: Diagnosis not present

## 2022-03-20 DIAGNOSIS — Z9181 History of falling: Secondary | ICD-10-CM | POA: Diagnosis not present

## 2022-03-20 DIAGNOSIS — G309 Alzheimer's disease, unspecified: Secondary | ICD-10-CM | POA: Diagnosis not present

## 2022-03-20 DIAGNOSIS — K2270 Barrett's esophagus without dysplasia: Secondary | ICD-10-CM | POA: Diagnosis not present

## 2022-03-20 DIAGNOSIS — M9701XD Periprosthetic fracture around internal prosthetic right hip joint, subsequent encounter: Secondary | ICD-10-CM | POA: Diagnosis not present

## 2022-03-20 DIAGNOSIS — K219 Gastro-esophageal reflux disease without esophagitis: Secondary | ICD-10-CM | POA: Diagnosis not present

## 2022-03-20 DIAGNOSIS — J449 Chronic obstructive pulmonary disease, unspecified: Secondary | ICD-10-CM | POA: Diagnosis not present

## 2022-03-21 DIAGNOSIS — F321 Major depressive disorder, single episode, moderate: Secondary | ICD-10-CM | POA: Diagnosis not present

## 2022-03-25 DIAGNOSIS — R1312 Dysphagia, oropharyngeal phase: Secondary | ICD-10-CM | POA: Diagnosis not present

## 2022-03-25 DIAGNOSIS — M6281 Muscle weakness (generalized): Secondary | ICD-10-CM | POA: Diagnosis not present

## 2022-03-25 DIAGNOSIS — Z7389 Other problems related to life management difficulty: Secondary | ICD-10-CM | POA: Diagnosis not present

## 2022-03-25 DIAGNOSIS — K2270 Barrett's esophagus without dysplasia: Secondary | ICD-10-CM | POA: Diagnosis not present

## 2022-03-25 DIAGNOSIS — M9701XD Periprosthetic fracture around internal prosthetic right hip joint, subsequent encounter: Secondary | ICD-10-CM | POA: Diagnosis not present

## 2022-03-25 DIAGNOSIS — J449 Chronic obstructive pulmonary disease, unspecified: Secondary | ICD-10-CM | POA: Diagnosis not present

## 2022-03-25 DIAGNOSIS — G309 Alzheimer's disease, unspecified: Secondary | ICD-10-CM | POA: Diagnosis not present

## 2022-03-25 DIAGNOSIS — Z9181 History of falling: Secondary | ICD-10-CM | POA: Diagnosis not present

## 2022-03-25 DIAGNOSIS — M6389 Disorders of muscle in diseases classified elsewhere, multiple sites: Secondary | ICD-10-CM | POA: Diagnosis not present

## 2022-03-25 DIAGNOSIS — K219 Gastro-esophageal reflux disease without esophagitis: Secondary | ICD-10-CM | POA: Diagnosis not present

## 2022-03-25 DIAGNOSIS — R262 Difficulty in walking, not elsewhere classified: Secondary | ICD-10-CM | POA: Diagnosis not present

## 2022-03-25 DIAGNOSIS — R41841 Cognitive communication deficit: Secondary | ICD-10-CM | POA: Diagnosis not present

## 2022-03-26 DIAGNOSIS — G309 Alzheimer's disease, unspecified: Secondary | ICD-10-CM | POA: Diagnosis not present

## 2022-03-26 DIAGNOSIS — J449 Chronic obstructive pulmonary disease, unspecified: Secondary | ICD-10-CM | POA: Diagnosis not present

## 2022-03-26 DIAGNOSIS — M9701XD Periprosthetic fracture around internal prosthetic right hip joint, subsequent encounter: Secondary | ICD-10-CM | POA: Diagnosis not present

## 2022-03-26 DIAGNOSIS — Z9181 History of falling: Secondary | ICD-10-CM | POA: Diagnosis not present

## 2022-03-26 DIAGNOSIS — K219 Gastro-esophageal reflux disease without esophagitis: Secondary | ICD-10-CM | POA: Diagnosis not present

## 2022-03-26 DIAGNOSIS — K2270 Barrett's esophagus without dysplasia: Secondary | ICD-10-CM | POA: Diagnosis not present

## 2022-03-27 DIAGNOSIS — M9701XD Periprosthetic fracture around internal prosthetic right hip joint, subsequent encounter: Secondary | ICD-10-CM | POA: Diagnosis not present

## 2022-03-27 DIAGNOSIS — K219 Gastro-esophageal reflux disease without esophagitis: Secondary | ICD-10-CM | POA: Diagnosis not present

## 2022-03-27 DIAGNOSIS — J449 Chronic obstructive pulmonary disease, unspecified: Secondary | ICD-10-CM | POA: Diagnosis not present

## 2022-03-27 DIAGNOSIS — K2270 Barrett's esophagus without dysplasia: Secondary | ICD-10-CM | POA: Diagnosis not present

## 2022-03-27 DIAGNOSIS — Z9181 History of falling: Secondary | ICD-10-CM | POA: Diagnosis not present

## 2022-03-27 DIAGNOSIS — G309 Alzheimer's disease, unspecified: Secondary | ICD-10-CM | POA: Diagnosis not present

## 2022-03-28 DIAGNOSIS — F03B3 Unspecified dementia, moderate, with mood disturbance: Secondary | ICD-10-CM | POA: Diagnosis not present

## 2022-03-28 DIAGNOSIS — F321 Major depressive disorder, single episode, moderate: Secondary | ICD-10-CM | POA: Diagnosis not present

## 2022-03-28 DIAGNOSIS — G309 Alzheimer's disease, unspecified: Secondary | ICD-10-CM | POA: Diagnosis not present

## 2022-03-29 DIAGNOSIS — Z96641 Presence of right artificial hip joint: Secondary | ICD-10-CM | POA: Diagnosis not present

## 2022-03-29 DIAGNOSIS — M9701XA Periprosthetic fracture around internal prosthetic right hip joint, initial encounter: Secondary | ICD-10-CM | POA: Diagnosis not present

## 2022-04-01 DIAGNOSIS — K219 Gastro-esophageal reflux disease without esophagitis: Secondary | ICD-10-CM | POA: Diagnosis not present

## 2022-04-01 DIAGNOSIS — K2270 Barrett's esophagus without dysplasia: Secondary | ICD-10-CM | POA: Diagnosis not present

## 2022-04-01 DIAGNOSIS — M9701XD Periprosthetic fracture around internal prosthetic right hip joint, subsequent encounter: Secondary | ICD-10-CM | POA: Diagnosis not present

## 2022-04-01 DIAGNOSIS — G309 Alzheimer's disease, unspecified: Secondary | ICD-10-CM | POA: Diagnosis not present

## 2022-04-01 DIAGNOSIS — Z9181 History of falling: Secondary | ICD-10-CM | POA: Diagnosis not present

## 2022-04-01 DIAGNOSIS — J449 Chronic obstructive pulmonary disease, unspecified: Secondary | ICD-10-CM | POA: Diagnosis not present

## 2022-04-02 DIAGNOSIS — K219 Gastro-esophageal reflux disease without esophagitis: Secondary | ICD-10-CM | POA: Diagnosis not present

## 2022-04-02 DIAGNOSIS — M9701XD Periprosthetic fracture around internal prosthetic right hip joint, subsequent encounter: Secondary | ICD-10-CM | POA: Diagnosis not present

## 2022-04-02 DIAGNOSIS — Z9181 History of falling: Secondary | ICD-10-CM | POA: Diagnosis not present

## 2022-04-02 DIAGNOSIS — J449 Chronic obstructive pulmonary disease, unspecified: Secondary | ICD-10-CM | POA: Diagnosis not present

## 2022-04-02 DIAGNOSIS — K2270 Barrett's esophagus without dysplasia: Secondary | ICD-10-CM | POA: Diagnosis not present

## 2022-04-02 DIAGNOSIS — G309 Alzheimer's disease, unspecified: Secondary | ICD-10-CM | POA: Diagnosis not present

## 2022-04-03 DIAGNOSIS — G309 Alzheimer's disease, unspecified: Secondary | ICD-10-CM | POA: Diagnosis not present

## 2022-04-03 DIAGNOSIS — K2270 Barrett's esophagus without dysplasia: Secondary | ICD-10-CM | POA: Diagnosis not present

## 2022-04-03 DIAGNOSIS — J449 Chronic obstructive pulmonary disease, unspecified: Secondary | ICD-10-CM | POA: Diagnosis not present

## 2022-04-03 DIAGNOSIS — K219 Gastro-esophageal reflux disease without esophagitis: Secondary | ICD-10-CM | POA: Diagnosis not present

## 2022-04-03 DIAGNOSIS — M9701XD Periprosthetic fracture around internal prosthetic right hip joint, subsequent encounter: Secondary | ICD-10-CM | POA: Diagnosis not present

## 2022-04-03 DIAGNOSIS — Z9181 History of falling: Secondary | ICD-10-CM | POA: Diagnosis not present

## 2022-04-04 DIAGNOSIS — G309 Alzheimer's disease, unspecified: Secondary | ICD-10-CM | POA: Diagnosis not present

## 2022-04-04 DIAGNOSIS — J449 Chronic obstructive pulmonary disease, unspecified: Secondary | ICD-10-CM | POA: Diagnosis not present

## 2022-04-04 DIAGNOSIS — K2270 Barrett's esophagus without dysplasia: Secondary | ICD-10-CM | POA: Diagnosis not present

## 2022-04-04 DIAGNOSIS — K219 Gastro-esophageal reflux disease without esophagitis: Secondary | ICD-10-CM | POA: Diagnosis not present

## 2022-04-04 DIAGNOSIS — Z9181 History of falling: Secondary | ICD-10-CM | POA: Diagnosis not present

## 2022-04-04 DIAGNOSIS — M9701XD Periprosthetic fracture around internal prosthetic right hip joint, subsequent encounter: Secondary | ICD-10-CM | POA: Diagnosis not present

## 2022-04-05 DIAGNOSIS — Z9181 History of falling: Secondary | ICD-10-CM | POA: Diagnosis not present

## 2022-04-05 DIAGNOSIS — G309 Alzheimer's disease, unspecified: Secondary | ICD-10-CM | POA: Diagnosis not present

## 2022-04-05 DIAGNOSIS — J449 Chronic obstructive pulmonary disease, unspecified: Secondary | ICD-10-CM | POA: Diagnosis not present

## 2022-04-05 DIAGNOSIS — K2270 Barrett's esophagus without dysplasia: Secondary | ICD-10-CM | POA: Diagnosis not present

## 2022-04-05 DIAGNOSIS — M9701XD Periprosthetic fracture around internal prosthetic right hip joint, subsequent encounter: Secondary | ICD-10-CM | POA: Diagnosis not present

## 2022-04-05 DIAGNOSIS — K219 Gastro-esophageal reflux disease without esophagitis: Secondary | ICD-10-CM | POA: Diagnosis not present

## 2022-04-08 DIAGNOSIS — Z9181 History of falling: Secondary | ICD-10-CM | POA: Diagnosis not present

## 2022-04-08 DIAGNOSIS — M9701XD Periprosthetic fracture around internal prosthetic right hip joint, subsequent encounter: Secondary | ICD-10-CM | POA: Diagnosis not present

## 2022-04-08 DIAGNOSIS — G309 Alzheimer's disease, unspecified: Secondary | ICD-10-CM | POA: Diagnosis not present

## 2022-04-08 DIAGNOSIS — K2270 Barrett's esophagus without dysplasia: Secondary | ICD-10-CM | POA: Diagnosis not present

## 2022-04-08 DIAGNOSIS — J449 Chronic obstructive pulmonary disease, unspecified: Secondary | ICD-10-CM | POA: Diagnosis not present

## 2022-04-08 DIAGNOSIS — K219 Gastro-esophageal reflux disease without esophagitis: Secondary | ICD-10-CM | POA: Diagnosis not present

## 2022-04-09 DIAGNOSIS — Z9181 History of falling: Secondary | ICD-10-CM | POA: Diagnosis not present

## 2022-04-09 DIAGNOSIS — J449 Chronic obstructive pulmonary disease, unspecified: Secondary | ICD-10-CM | POA: Diagnosis not present

## 2022-04-09 DIAGNOSIS — M9701XD Periprosthetic fracture around internal prosthetic right hip joint, subsequent encounter: Secondary | ICD-10-CM | POA: Diagnosis not present

## 2022-04-09 DIAGNOSIS — K2270 Barrett's esophagus without dysplasia: Secondary | ICD-10-CM | POA: Diagnosis not present

## 2022-04-09 DIAGNOSIS — G309 Alzheimer's disease, unspecified: Secondary | ICD-10-CM | POA: Diagnosis not present

## 2022-04-09 DIAGNOSIS — K219 Gastro-esophageal reflux disease without esophagitis: Secondary | ICD-10-CM | POA: Diagnosis not present

## 2022-04-10 DIAGNOSIS — J449 Chronic obstructive pulmonary disease, unspecified: Secondary | ICD-10-CM | POA: Diagnosis not present

## 2022-04-10 DIAGNOSIS — K219 Gastro-esophageal reflux disease without esophagitis: Secondary | ICD-10-CM | POA: Diagnosis not present

## 2022-04-10 DIAGNOSIS — G309 Alzheimer's disease, unspecified: Secondary | ICD-10-CM | POA: Diagnosis not present

## 2022-04-10 DIAGNOSIS — M9701XD Periprosthetic fracture around internal prosthetic right hip joint, subsequent encounter: Secondary | ICD-10-CM | POA: Diagnosis not present

## 2022-04-10 DIAGNOSIS — Z9181 History of falling: Secondary | ICD-10-CM | POA: Diagnosis not present

## 2022-04-10 DIAGNOSIS — K2270 Barrett's esophagus without dysplasia: Secondary | ICD-10-CM | POA: Diagnosis not present

## 2022-04-11 DIAGNOSIS — M9701XD Periprosthetic fracture around internal prosthetic right hip joint, subsequent encounter: Secondary | ICD-10-CM | POA: Diagnosis not present

## 2022-04-11 DIAGNOSIS — F321 Major depressive disorder, single episode, moderate: Secondary | ICD-10-CM | POA: Diagnosis not present

## 2022-04-11 DIAGNOSIS — Z9181 History of falling: Secondary | ICD-10-CM | POA: Diagnosis not present

## 2022-04-11 DIAGNOSIS — J449 Chronic obstructive pulmonary disease, unspecified: Secondary | ICD-10-CM | POA: Diagnosis not present

## 2022-04-11 DIAGNOSIS — G309 Alzheimer's disease, unspecified: Secondary | ICD-10-CM | POA: Diagnosis not present

## 2022-04-11 DIAGNOSIS — K2270 Barrett's esophagus without dysplasia: Secondary | ICD-10-CM | POA: Diagnosis not present

## 2022-04-11 DIAGNOSIS — K219 Gastro-esophageal reflux disease without esophagitis: Secondary | ICD-10-CM | POA: Diagnosis not present

## 2022-04-12 DIAGNOSIS — G309 Alzheimer's disease, unspecified: Secondary | ICD-10-CM | POA: Diagnosis not present

## 2022-04-12 DIAGNOSIS — K219 Gastro-esophageal reflux disease without esophagitis: Secondary | ICD-10-CM | POA: Diagnosis not present

## 2022-04-12 DIAGNOSIS — M9701XD Periprosthetic fracture around internal prosthetic right hip joint, subsequent encounter: Secondary | ICD-10-CM | POA: Diagnosis not present

## 2022-04-12 DIAGNOSIS — Z9181 History of falling: Secondary | ICD-10-CM | POA: Diagnosis not present

## 2022-04-12 DIAGNOSIS — K2270 Barrett's esophagus without dysplasia: Secondary | ICD-10-CM | POA: Diagnosis not present

## 2022-04-12 DIAGNOSIS — J449 Chronic obstructive pulmonary disease, unspecified: Secondary | ICD-10-CM | POA: Diagnosis not present

## 2022-04-15 DIAGNOSIS — K219 Gastro-esophageal reflux disease without esophagitis: Secondary | ICD-10-CM | POA: Diagnosis not present

## 2022-04-15 DIAGNOSIS — G309 Alzheimer's disease, unspecified: Secondary | ICD-10-CM | POA: Diagnosis not present

## 2022-04-15 DIAGNOSIS — M9701XD Periprosthetic fracture around internal prosthetic right hip joint, subsequent encounter: Secondary | ICD-10-CM | POA: Diagnosis not present

## 2022-04-15 DIAGNOSIS — K2270 Barrett's esophagus without dysplasia: Secondary | ICD-10-CM | POA: Diagnosis not present

## 2022-04-15 DIAGNOSIS — Z9181 History of falling: Secondary | ICD-10-CM | POA: Diagnosis not present

## 2022-04-15 DIAGNOSIS — J449 Chronic obstructive pulmonary disease, unspecified: Secondary | ICD-10-CM | POA: Diagnosis not present

## 2022-04-16 DIAGNOSIS — Z9181 History of falling: Secondary | ICD-10-CM | POA: Diagnosis not present

## 2022-04-16 DIAGNOSIS — K2270 Barrett's esophagus without dysplasia: Secondary | ICD-10-CM | POA: Diagnosis not present

## 2022-04-16 DIAGNOSIS — K219 Gastro-esophageal reflux disease without esophagitis: Secondary | ICD-10-CM | POA: Diagnosis not present

## 2022-04-16 DIAGNOSIS — J449 Chronic obstructive pulmonary disease, unspecified: Secondary | ICD-10-CM | POA: Diagnosis not present

## 2022-04-16 DIAGNOSIS — M9701XD Periprosthetic fracture around internal prosthetic right hip joint, subsequent encounter: Secondary | ICD-10-CM | POA: Diagnosis not present

## 2022-04-16 DIAGNOSIS — G309 Alzheimer's disease, unspecified: Secondary | ICD-10-CM | POA: Diagnosis not present

## 2022-04-17 DIAGNOSIS — M9701XD Periprosthetic fracture around internal prosthetic right hip joint, subsequent encounter: Secondary | ICD-10-CM | POA: Diagnosis not present

## 2022-04-17 DIAGNOSIS — K219 Gastro-esophageal reflux disease without esophagitis: Secondary | ICD-10-CM | POA: Diagnosis not present

## 2022-04-17 DIAGNOSIS — J449 Chronic obstructive pulmonary disease, unspecified: Secondary | ICD-10-CM | POA: Diagnosis not present

## 2022-04-17 DIAGNOSIS — Z9181 History of falling: Secondary | ICD-10-CM | POA: Diagnosis not present

## 2022-04-17 DIAGNOSIS — K2270 Barrett's esophagus without dysplasia: Secondary | ICD-10-CM | POA: Diagnosis not present

## 2022-04-17 DIAGNOSIS — G309 Alzheimer's disease, unspecified: Secondary | ICD-10-CM | POA: Diagnosis not present

## 2022-04-18 DIAGNOSIS — J449 Chronic obstructive pulmonary disease, unspecified: Secondary | ICD-10-CM | POA: Diagnosis not present

## 2022-04-18 DIAGNOSIS — M9701XD Periprosthetic fracture around internal prosthetic right hip joint, subsequent encounter: Secondary | ICD-10-CM | POA: Diagnosis not present

## 2022-04-18 DIAGNOSIS — K219 Gastro-esophageal reflux disease without esophagitis: Secondary | ICD-10-CM | POA: Diagnosis not present

## 2022-04-18 DIAGNOSIS — Z9181 History of falling: Secondary | ICD-10-CM | POA: Diagnosis not present

## 2022-04-18 DIAGNOSIS — K2270 Barrett's esophagus without dysplasia: Secondary | ICD-10-CM | POA: Diagnosis not present

## 2022-04-18 DIAGNOSIS — G309 Alzheimer's disease, unspecified: Secondary | ICD-10-CM | POA: Diagnosis not present

## 2022-04-18 DIAGNOSIS — F321 Major depressive disorder, single episode, moderate: Secondary | ICD-10-CM | POA: Diagnosis not present

## 2022-04-19 DIAGNOSIS — K219 Gastro-esophageal reflux disease without esophagitis: Secondary | ICD-10-CM | POA: Diagnosis not present

## 2022-04-19 DIAGNOSIS — M9701XD Periprosthetic fracture around internal prosthetic right hip joint, subsequent encounter: Secondary | ICD-10-CM | POA: Diagnosis not present

## 2022-04-19 DIAGNOSIS — Z9181 History of falling: Secondary | ICD-10-CM | POA: Diagnosis not present

## 2022-04-19 DIAGNOSIS — G309 Alzheimer's disease, unspecified: Secondary | ICD-10-CM | POA: Diagnosis not present

## 2022-04-19 DIAGNOSIS — K2270 Barrett's esophagus without dysplasia: Secondary | ICD-10-CM | POA: Diagnosis not present

## 2022-04-19 DIAGNOSIS — J449 Chronic obstructive pulmonary disease, unspecified: Secondary | ICD-10-CM | POA: Diagnosis not present

## 2022-04-20 DIAGNOSIS — R41841 Cognitive communication deficit: Secondary | ICD-10-CM | POA: Diagnosis not present

## 2022-04-20 DIAGNOSIS — Z9181 History of falling: Secondary | ICD-10-CM | POA: Diagnosis not present

## 2022-04-20 DIAGNOSIS — M9701XD Periprosthetic fracture around internal prosthetic right hip joint, subsequent encounter: Secondary | ICD-10-CM | POA: Diagnosis not present

## 2022-04-20 DIAGNOSIS — K219 Gastro-esophageal reflux disease without esophagitis: Secondary | ICD-10-CM | POA: Diagnosis not present

## 2022-04-20 DIAGNOSIS — G309 Alzheimer's disease, unspecified: Secondary | ICD-10-CM | POA: Diagnosis not present

## 2022-04-20 DIAGNOSIS — R1312 Dysphagia, oropharyngeal phase: Secondary | ICD-10-CM | POA: Diagnosis not present

## 2022-04-20 DIAGNOSIS — K2270 Barrett's esophagus without dysplasia: Secondary | ICD-10-CM | POA: Diagnosis not present

## 2022-04-20 DIAGNOSIS — J449 Chronic obstructive pulmonary disease, unspecified: Secondary | ICD-10-CM | POA: Diagnosis not present

## 2022-04-20 DIAGNOSIS — M6281 Muscle weakness (generalized): Secondary | ICD-10-CM | POA: Diagnosis not present

## 2022-04-20 DIAGNOSIS — M6389 Disorders of muscle in diseases classified elsewhere, multiple sites: Secondary | ICD-10-CM | POA: Diagnosis not present

## 2022-04-20 DIAGNOSIS — R262 Difficulty in walking, not elsewhere classified: Secondary | ICD-10-CM | POA: Diagnosis not present

## 2022-04-20 DIAGNOSIS — Z7389 Other problems related to life management difficulty: Secondary | ICD-10-CM | POA: Diagnosis not present

## 2022-04-22 DIAGNOSIS — M9701XD Periprosthetic fracture around internal prosthetic right hip joint, subsequent encounter: Secondary | ICD-10-CM | POA: Diagnosis not present

## 2022-04-22 DIAGNOSIS — G309 Alzheimer's disease, unspecified: Secondary | ICD-10-CM | POA: Diagnosis not present

## 2022-04-22 DIAGNOSIS — Z9181 History of falling: Secondary | ICD-10-CM | POA: Diagnosis not present

## 2022-04-22 DIAGNOSIS — J449 Chronic obstructive pulmonary disease, unspecified: Secondary | ICD-10-CM | POA: Diagnosis not present

## 2022-04-22 DIAGNOSIS — K2270 Barrett's esophagus without dysplasia: Secondary | ICD-10-CM | POA: Diagnosis not present

## 2022-04-22 DIAGNOSIS — K219 Gastro-esophageal reflux disease without esophagitis: Secondary | ICD-10-CM | POA: Diagnosis not present

## 2022-04-23 DIAGNOSIS — Z9181 History of falling: Secondary | ICD-10-CM | POA: Diagnosis not present

## 2022-04-23 DIAGNOSIS — G309 Alzheimer's disease, unspecified: Secondary | ICD-10-CM | POA: Diagnosis not present

## 2022-04-23 DIAGNOSIS — K2270 Barrett's esophagus without dysplasia: Secondary | ICD-10-CM | POA: Diagnosis not present

## 2022-04-23 DIAGNOSIS — M9701XD Periprosthetic fracture around internal prosthetic right hip joint, subsequent encounter: Secondary | ICD-10-CM | POA: Diagnosis not present

## 2022-04-23 DIAGNOSIS — J449 Chronic obstructive pulmonary disease, unspecified: Secondary | ICD-10-CM | POA: Diagnosis not present

## 2022-04-23 DIAGNOSIS — K219 Gastro-esophageal reflux disease without esophagitis: Secondary | ICD-10-CM | POA: Diagnosis not present

## 2022-04-24 DIAGNOSIS — K219 Gastro-esophageal reflux disease without esophagitis: Secondary | ICD-10-CM | POA: Diagnosis not present

## 2022-04-24 DIAGNOSIS — K2270 Barrett's esophagus without dysplasia: Secondary | ICD-10-CM | POA: Diagnosis not present

## 2022-04-24 DIAGNOSIS — G309 Alzheimer's disease, unspecified: Secondary | ICD-10-CM | POA: Diagnosis not present

## 2022-04-24 DIAGNOSIS — J449 Chronic obstructive pulmonary disease, unspecified: Secondary | ICD-10-CM | POA: Diagnosis not present

## 2022-04-24 DIAGNOSIS — Z9181 History of falling: Secondary | ICD-10-CM | POA: Diagnosis not present

## 2022-04-24 DIAGNOSIS — M9701XD Periprosthetic fracture around internal prosthetic right hip joint, subsequent encounter: Secondary | ICD-10-CM | POA: Diagnosis not present

## 2022-04-25 DIAGNOSIS — K219 Gastro-esophageal reflux disease without esophagitis: Secondary | ICD-10-CM | POA: Diagnosis not present

## 2022-04-25 DIAGNOSIS — K2270 Barrett's esophagus without dysplasia: Secondary | ICD-10-CM | POA: Diagnosis not present

## 2022-04-25 DIAGNOSIS — G309 Alzheimer's disease, unspecified: Secondary | ICD-10-CM | POA: Diagnosis not present

## 2022-04-25 DIAGNOSIS — M9701XD Periprosthetic fracture around internal prosthetic right hip joint, subsequent encounter: Secondary | ICD-10-CM | POA: Diagnosis not present

## 2022-04-25 DIAGNOSIS — J449 Chronic obstructive pulmonary disease, unspecified: Secondary | ICD-10-CM | POA: Diagnosis not present

## 2022-04-25 DIAGNOSIS — F321 Major depressive disorder, single episode, moderate: Secondary | ICD-10-CM | POA: Diagnosis not present

## 2022-04-25 DIAGNOSIS — Z9181 History of falling: Secondary | ICD-10-CM | POA: Diagnosis not present

## 2022-04-26 DIAGNOSIS — Z9181 History of falling: Secondary | ICD-10-CM | POA: Diagnosis not present

## 2022-04-26 DIAGNOSIS — G309 Alzheimer's disease, unspecified: Secondary | ICD-10-CM | POA: Diagnosis not present

## 2022-04-26 DIAGNOSIS — K2270 Barrett's esophagus without dysplasia: Secondary | ICD-10-CM | POA: Diagnosis not present

## 2022-04-26 DIAGNOSIS — J449 Chronic obstructive pulmonary disease, unspecified: Secondary | ICD-10-CM | POA: Diagnosis not present

## 2022-04-26 DIAGNOSIS — K219 Gastro-esophageal reflux disease without esophagitis: Secondary | ICD-10-CM | POA: Diagnosis not present

## 2022-04-26 DIAGNOSIS — M9701XD Periprosthetic fracture around internal prosthetic right hip joint, subsequent encounter: Secondary | ICD-10-CM | POA: Diagnosis not present

## 2022-04-29 DIAGNOSIS — Z9181 History of falling: Secondary | ICD-10-CM | POA: Diagnosis not present

## 2022-04-29 DIAGNOSIS — G309 Alzheimer's disease, unspecified: Secondary | ICD-10-CM | POA: Diagnosis not present

## 2022-04-29 DIAGNOSIS — K2270 Barrett's esophagus without dysplasia: Secondary | ICD-10-CM | POA: Diagnosis not present

## 2022-04-29 DIAGNOSIS — M9701XD Periprosthetic fracture around internal prosthetic right hip joint, subsequent encounter: Secondary | ICD-10-CM | POA: Diagnosis not present

## 2022-04-29 DIAGNOSIS — K219 Gastro-esophageal reflux disease without esophagitis: Secondary | ICD-10-CM | POA: Diagnosis not present

## 2022-04-29 DIAGNOSIS — J449 Chronic obstructive pulmonary disease, unspecified: Secondary | ICD-10-CM | POA: Diagnosis not present

## 2022-04-30 DIAGNOSIS — G309 Alzheimer's disease, unspecified: Secondary | ICD-10-CM | POA: Diagnosis not present

## 2022-04-30 DIAGNOSIS — M9701XD Periprosthetic fracture around internal prosthetic right hip joint, subsequent encounter: Secondary | ICD-10-CM | POA: Diagnosis not present

## 2022-04-30 DIAGNOSIS — Z9181 History of falling: Secondary | ICD-10-CM | POA: Diagnosis not present

## 2022-04-30 DIAGNOSIS — K2270 Barrett's esophagus without dysplasia: Secondary | ICD-10-CM | POA: Diagnosis not present

## 2022-04-30 DIAGNOSIS — K219 Gastro-esophageal reflux disease without esophagitis: Secondary | ICD-10-CM | POA: Diagnosis not present

## 2022-04-30 DIAGNOSIS — J449 Chronic obstructive pulmonary disease, unspecified: Secondary | ICD-10-CM | POA: Diagnosis not present

## 2022-05-01 DIAGNOSIS — K2270 Barrett's esophagus without dysplasia: Secondary | ICD-10-CM | POA: Diagnosis not present

## 2022-05-01 DIAGNOSIS — K219 Gastro-esophageal reflux disease without esophagitis: Secondary | ICD-10-CM | POA: Diagnosis not present

## 2022-05-01 DIAGNOSIS — G309 Alzheimer's disease, unspecified: Secondary | ICD-10-CM | POA: Diagnosis not present

## 2022-05-01 DIAGNOSIS — J449 Chronic obstructive pulmonary disease, unspecified: Secondary | ICD-10-CM | POA: Diagnosis not present

## 2022-05-01 DIAGNOSIS — Z9181 History of falling: Secondary | ICD-10-CM | POA: Diagnosis not present

## 2022-05-01 DIAGNOSIS — M9701XD Periprosthetic fracture around internal prosthetic right hip joint, subsequent encounter: Secondary | ICD-10-CM | POA: Diagnosis not present

## 2022-05-02 DIAGNOSIS — K2270 Barrett's esophagus without dysplasia: Secondary | ICD-10-CM | POA: Diagnosis not present

## 2022-05-02 DIAGNOSIS — K219 Gastro-esophageal reflux disease without esophagitis: Secondary | ICD-10-CM | POA: Diagnosis not present

## 2022-05-02 DIAGNOSIS — G309 Alzheimer's disease, unspecified: Secondary | ICD-10-CM | POA: Diagnosis not present

## 2022-05-02 DIAGNOSIS — M9701XD Periprosthetic fracture around internal prosthetic right hip joint, subsequent encounter: Secondary | ICD-10-CM | POA: Diagnosis not present

## 2022-05-02 DIAGNOSIS — Z9181 History of falling: Secondary | ICD-10-CM | POA: Diagnosis not present

## 2022-05-02 DIAGNOSIS — J449 Chronic obstructive pulmonary disease, unspecified: Secondary | ICD-10-CM | POA: Diagnosis not present

## 2022-05-03 DIAGNOSIS — Z9181 History of falling: Secondary | ICD-10-CM | POA: Diagnosis not present

## 2022-05-03 DIAGNOSIS — F03B3 Unspecified dementia, moderate, with mood disturbance: Secondary | ICD-10-CM | POA: Diagnosis not present

## 2022-05-03 DIAGNOSIS — J449 Chronic obstructive pulmonary disease, unspecified: Secondary | ICD-10-CM | POA: Diagnosis not present

## 2022-05-03 DIAGNOSIS — K2270 Barrett's esophagus without dysplasia: Secondary | ICD-10-CM | POA: Diagnosis not present

## 2022-05-03 DIAGNOSIS — F321 Major depressive disorder, single episode, moderate: Secondary | ICD-10-CM | POA: Diagnosis not present

## 2022-05-03 DIAGNOSIS — K219 Gastro-esophageal reflux disease without esophagitis: Secondary | ICD-10-CM | POA: Diagnosis not present

## 2022-05-03 DIAGNOSIS — M9701XD Periprosthetic fracture around internal prosthetic right hip joint, subsequent encounter: Secondary | ICD-10-CM | POA: Diagnosis not present

## 2022-05-03 DIAGNOSIS — G309 Alzheimer's disease, unspecified: Secondary | ICD-10-CM | POA: Diagnosis not present

## 2022-05-06 DIAGNOSIS — R569 Unspecified convulsions: Secondary | ICD-10-CM | POA: Diagnosis not present

## 2022-05-06 DIAGNOSIS — G309 Alzheimer's disease, unspecified: Secondary | ICD-10-CM | POA: Diagnosis not present

## 2022-05-06 DIAGNOSIS — K219 Gastro-esophageal reflux disease without esophagitis: Secondary | ICD-10-CM | POA: Diagnosis not present

## 2022-05-06 DIAGNOSIS — E785 Hyperlipidemia, unspecified: Secondary | ICD-10-CM | POA: Diagnosis not present

## 2022-05-06 DIAGNOSIS — Z9181 History of falling: Secondary | ICD-10-CM | POA: Diagnosis not present

## 2022-05-06 DIAGNOSIS — M9701XD Periprosthetic fracture around internal prosthetic right hip joint, subsequent encounter: Secondary | ICD-10-CM | POA: Diagnosis not present

## 2022-05-06 DIAGNOSIS — K2270 Barrett's esophagus without dysplasia: Secondary | ICD-10-CM | POA: Diagnosis not present

## 2022-05-06 DIAGNOSIS — Z7902 Long term (current) use of antithrombotics/antiplatelets: Secondary | ICD-10-CM | POA: Diagnosis not present

## 2022-05-06 DIAGNOSIS — J449 Chronic obstructive pulmonary disease, unspecified: Secondary | ICD-10-CM | POA: Diagnosis not present

## 2022-05-06 DIAGNOSIS — E039 Hypothyroidism, unspecified: Secondary | ICD-10-CM | POA: Diagnosis not present

## 2022-05-06 DIAGNOSIS — Z8781 Personal history of (healed) traumatic fracture: Secondary | ICD-10-CM | POA: Diagnosis not present

## 2022-05-06 DIAGNOSIS — F0390 Unspecified dementia without behavioral disturbance: Secondary | ICD-10-CM | POA: Diagnosis not present

## 2022-05-07 DIAGNOSIS — J449 Chronic obstructive pulmonary disease, unspecified: Secondary | ICD-10-CM | POA: Diagnosis not present

## 2022-05-07 DIAGNOSIS — K219 Gastro-esophageal reflux disease without esophagitis: Secondary | ICD-10-CM | POA: Diagnosis not present

## 2022-05-07 DIAGNOSIS — Z9181 History of falling: Secondary | ICD-10-CM | POA: Diagnosis not present

## 2022-05-07 DIAGNOSIS — G309 Alzheimer's disease, unspecified: Secondary | ICD-10-CM | POA: Diagnosis not present

## 2022-05-07 DIAGNOSIS — M9701XD Periprosthetic fracture around internal prosthetic right hip joint, subsequent encounter: Secondary | ICD-10-CM | POA: Diagnosis not present

## 2022-05-07 DIAGNOSIS — K2270 Barrett's esophagus without dysplasia: Secondary | ICD-10-CM | POA: Diagnosis not present

## 2022-05-08 DIAGNOSIS — K2270 Barrett's esophagus without dysplasia: Secondary | ICD-10-CM | POA: Diagnosis not present

## 2022-05-08 DIAGNOSIS — J449 Chronic obstructive pulmonary disease, unspecified: Secondary | ICD-10-CM | POA: Diagnosis not present

## 2022-05-08 DIAGNOSIS — M9701XD Periprosthetic fracture around internal prosthetic right hip joint, subsequent encounter: Secondary | ICD-10-CM | POA: Diagnosis not present

## 2022-05-08 DIAGNOSIS — K219 Gastro-esophageal reflux disease without esophagitis: Secondary | ICD-10-CM | POA: Diagnosis not present

## 2022-05-08 DIAGNOSIS — Z9181 History of falling: Secondary | ICD-10-CM | POA: Diagnosis not present

## 2022-05-08 DIAGNOSIS — G309 Alzheimer's disease, unspecified: Secondary | ICD-10-CM | POA: Diagnosis not present

## 2022-05-09 DIAGNOSIS — G309 Alzheimer's disease, unspecified: Secondary | ICD-10-CM | POA: Diagnosis not present

## 2022-05-09 DIAGNOSIS — J449 Chronic obstructive pulmonary disease, unspecified: Secondary | ICD-10-CM | POA: Diagnosis not present

## 2022-05-09 DIAGNOSIS — Z9181 History of falling: Secondary | ICD-10-CM | POA: Diagnosis not present

## 2022-05-09 DIAGNOSIS — K2270 Barrett's esophagus without dysplasia: Secondary | ICD-10-CM | POA: Diagnosis not present

## 2022-05-09 DIAGNOSIS — K219 Gastro-esophageal reflux disease without esophagitis: Secondary | ICD-10-CM | POA: Diagnosis not present

## 2022-05-09 DIAGNOSIS — M9701XD Periprosthetic fracture around internal prosthetic right hip joint, subsequent encounter: Secondary | ICD-10-CM | POA: Diagnosis not present

## 2022-05-10 DIAGNOSIS — K219 Gastro-esophageal reflux disease without esophagitis: Secondary | ICD-10-CM | POA: Diagnosis not present

## 2022-05-10 DIAGNOSIS — G309 Alzheimer's disease, unspecified: Secondary | ICD-10-CM | POA: Diagnosis not present

## 2022-05-10 DIAGNOSIS — M9701XD Periprosthetic fracture around internal prosthetic right hip joint, subsequent encounter: Secondary | ICD-10-CM | POA: Diagnosis not present

## 2022-05-10 DIAGNOSIS — Z9181 History of falling: Secondary | ICD-10-CM | POA: Diagnosis not present

## 2022-05-10 DIAGNOSIS — J449 Chronic obstructive pulmonary disease, unspecified: Secondary | ICD-10-CM | POA: Diagnosis not present

## 2022-05-10 DIAGNOSIS — K2270 Barrett's esophagus without dysplasia: Secondary | ICD-10-CM | POA: Diagnosis not present

## 2022-05-13 DIAGNOSIS — K219 Gastro-esophageal reflux disease without esophagitis: Secondary | ICD-10-CM | POA: Diagnosis not present

## 2022-05-13 DIAGNOSIS — J449 Chronic obstructive pulmonary disease, unspecified: Secondary | ICD-10-CM | POA: Diagnosis not present

## 2022-05-13 DIAGNOSIS — Z9181 History of falling: Secondary | ICD-10-CM | POA: Diagnosis not present

## 2022-05-13 DIAGNOSIS — K2270 Barrett's esophagus without dysplasia: Secondary | ICD-10-CM | POA: Diagnosis not present

## 2022-05-13 DIAGNOSIS — G309 Alzheimer's disease, unspecified: Secondary | ICD-10-CM | POA: Diagnosis not present

## 2022-05-13 DIAGNOSIS — M9701XD Periprosthetic fracture around internal prosthetic right hip joint, subsequent encounter: Secondary | ICD-10-CM | POA: Diagnosis not present

## 2022-05-14 DIAGNOSIS — J449 Chronic obstructive pulmonary disease, unspecified: Secondary | ICD-10-CM | POA: Diagnosis not present

## 2022-05-14 DIAGNOSIS — K219 Gastro-esophageal reflux disease without esophagitis: Secondary | ICD-10-CM | POA: Diagnosis not present

## 2022-05-14 DIAGNOSIS — K2270 Barrett's esophagus without dysplasia: Secondary | ICD-10-CM | POA: Diagnosis not present

## 2022-05-14 DIAGNOSIS — M9701XD Periprosthetic fracture around internal prosthetic right hip joint, subsequent encounter: Secondary | ICD-10-CM | POA: Diagnosis not present

## 2022-05-14 DIAGNOSIS — G309 Alzheimer's disease, unspecified: Secondary | ICD-10-CM | POA: Diagnosis not present

## 2022-05-14 DIAGNOSIS — Z9181 History of falling: Secondary | ICD-10-CM | POA: Diagnosis not present

## 2022-05-15 DIAGNOSIS — Z9181 History of falling: Secondary | ICD-10-CM | POA: Diagnosis not present

## 2022-05-15 DIAGNOSIS — M9701XD Periprosthetic fracture around internal prosthetic right hip joint, subsequent encounter: Secondary | ICD-10-CM | POA: Diagnosis not present

## 2022-05-15 DIAGNOSIS — G309 Alzheimer's disease, unspecified: Secondary | ICD-10-CM | POA: Diagnosis not present

## 2022-05-15 DIAGNOSIS — J449 Chronic obstructive pulmonary disease, unspecified: Secondary | ICD-10-CM | POA: Diagnosis not present

## 2022-05-15 DIAGNOSIS — K2270 Barrett's esophagus without dysplasia: Secondary | ICD-10-CM | POA: Diagnosis not present

## 2022-05-15 DIAGNOSIS — K219 Gastro-esophageal reflux disease without esophagitis: Secondary | ICD-10-CM | POA: Diagnosis not present

## 2022-05-16 DIAGNOSIS — J449 Chronic obstructive pulmonary disease, unspecified: Secondary | ICD-10-CM | POA: Diagnosis not present

## 2022-05-16 DIAGNOSIS — Z9181 History of falling: Secondary | ICD-10-CM | POA: Diagnosis not present

## 2022-05-16 DIAGNOSIS — M9701XD Periprosthetic fracture around internal prosthetic right hip joint, subsequent encounter: Secondary | ICD-10-CM | POA: Diagnosis not present

## 2022-05-16 DIAGNOSIS — K219 Gastro-esophageal reflux disease without esophagitis: Secondary | ICD-10-CM | POA: Diagnosis not present

## 2022-05-16 DIAGNOSIS — G309 Alzheimer's disease, unspecified: Secondary | ICD-10-CM | POA: Diagnosis not present

## 2022-05-16 DIAGNOSIS — K2270 Barrett's esophagus without dysplasia: Secondary | ICD-10-CM | POA: Diagnosis not present

## 2022-05-17 DIAGNOSIS — M25551 Pain in right hip: Secondary | ICD-10-CM | POA: Diagnosis not present

## 2022-05-17 DIAGNOSIS — Z96641 Presence of right artificial hip joint: Secondary | ICD-10-CM | POA: Diagnosis not present

## 2022-05-17 DIAGNOSIS — Z9181 History of falling: Secondary | ICD-10-CM | POA: Diagnosis not present

## 2022-05-17 DIAGNOSIS — M9701XA Periprosthetic fracture around internal prosthetic right hip joint, initial encounter: Secondary | ICD-10-CM | POA: Diagnosis not present

## 2022-05-17 DIAGNOSIS — G309 Alzheimer's disease, unspecified: Secondary | ICD-10-CM | POA: Diagnosis not present

## 2022-05-17 DIAGNOSIS — M9701XD Periprosthetic fracture around internal prosthetic right hip joint, subsequent encounter: Secondary | ICD-10-CM | POA: Diagnosis not present

## 2022-05-17 DIAGNOSIS — J449 Chronic obstructive pulmonary disease, unspecified: Secondary | ICD-10-CM | POA: Diagnosis not present

## 2022-05-17 DIAGNOSIS — K219 Gastro-esophageal reflux disease without esophagitis: Secondary | ICD-10-CM | POA: Diagnosis not present

## 2022-05-17 DIAGNOSIS — K2270 Barrett's esophagus without dysplasia: Secondary | ICD-10-CM | POA: Diagnosis not present

## 2022-05-20 DIAGNOSIS — G309 Alzheimer's disease, unspecified: Secondary | ICD-10-CM | POA: Diagnosis not present

## 2022-05-20 DIAGNOSIS — Z9181 History of falling: Secondary | ICD-10-CM | POA: Diagnosis not present

## 2022-05-20 DIAGNOSIS — K2270 Barrett's esophagus without dysplasia: Secondary | ICD-10-CM | POA: Diagnosis not present

## 2022-05-20 DIAGNOSIS — M9701XD Periprosthetic fracture around internal prosthetic right hip joint, subsequent encounter: Secondary | ICD-10-CM | POA: Diagnosis not present

## 2022-05-20 DIAGNOSIS — J449 Chronic obstructive pulmonary disease, unspecified: Secondary | ICD-10-CM | POA: Diagnosis not present

## 2022-05-20 DIAGNOSIS — K219 Gastro-esophageal reflux disease without esophagitis: Secondary | ICD-10-CM | POA: Diagnosis not present

## 2022-05-21 DIAGNOSIS — Z7389 Other problems related to life management difficulty: Secondary | ICD-10-CM | POA: Diagnosis not present

## 2022-05-21 DIAGNOSIS — R262 Difficulty in walking, not elsewhere classified: Secondary | ICD-10-CM | POA: Diagnosis not present

## 2022-05-21 DIAGNOSIS — R41841 Cognitive communication deficit: Secondary | ICD-10-CM | POA: Diagnosis not present

## 2022-05-21 DIAGNOSIS — M6281 Muscle weakness (generalized): Secondary | ICD-10-CM | POA: Diagnosis not present

## 2022-05-21 DIAGNOSIS — R1312 Dysphagia, oropharyngeal phase: Secondary | ICD-10-CM | POA: Diagnosis not present

## 2022-05-22 DIAGNOSIS — R262 Difficulty in walking, not elsewhere classified: Secondary | ICD-10-CM | POA: Diagnosis not present

## 2022-05-22 DIAGNOSIS — M6281 Muscle weakness (generalized): Secondary | ICD-10-CM | POA: Diagnosis not present

## 2022-05-22 DIAGNOSIS — R1312 Dysphagia, oropharyngeal phase: Secondary | ICD-10-CM | POA: Diagnosis not present

## 2022-05-22 DIAGNOSIS — Z7389 Other problems related to life management difficulty: Secondary | ICD-10-CM | POA: Diagnosis not present

## 2022-05-22 DIAGNOSIS — R41841 Cognitive communication deficit: Secondary | ICD-10-CM | POA: Diagnosis not present

## 2022-05-27 DIAGNOSIS — R41841 Cognitive communication deficit: Secondary | ICD-10-CM | POA: Diagnosis not present

## 2022-05-27 DIAGNOSIS — M6281 Muscle weakness (generalized): Secondary | ICD-10-CM | POA: Diagnosis not present

## 2022-05-27 DIAGNOSIS — M9701XD Periprosthetic fracture around internal prosthetic right hip joint, subsequent encounter: Secondary | ICD-10-CM | POA: Diagnosis not present

## 2022-05-27 DIAGNOSIS — G309 Alzheimer's disease, unspecified: Secondary | ICD-10-CM | POA: Diagnosis not present

## 2022-05-27 DIAGNOSIS — Z9181 History of falling: Secondary | ICD-10-CM | POA: Diagnosis not present

## 2022-05-28 DIAGNOSIS — M6281 Muscle weakness (generalized): Secondary | ICD-10-CM | POA: Diagnosis not present

## 2022-05-28 DIAGNOSIS — G309 Alzheimer's disease, unspecified: Secondary | ICD-10-CM | POA: Diagnosis not present

## 2022-05-28 DIAGNOSIS — R41841 Cognitive communication deficit: Secondary | ICD-10-CM | POA: Diagnosis not present

## 2022-05-28 DIAGNOSIS — Z9181 History of falling: Secondary | ICD-10-CM | POA: Diagnosis not present

## 2022-05-28 DIAGNOSIS — M9701XD Periprosthetic fracture around internal prosthetic right hip joint, subsequent encounter: Secondary | ICD-10-CM | POA: Diagnosis not present

## 2022-05-29 DIAGNOSIS — G309 Alzheimer's disease, unspecified: Secondary | ICD-10-CM | POA: Diagnosis not present

## 2022-05-29 DIAGNOSIS — R41841 Cognitive communication deficit: Secondary | ICD-10-CM | POA: Diagnosis not present

## 2022-05-29 DIAGNOSIS — M9701XD Periprosthetic fracture around internal prosthetic right hip joint, subsequent encounter: Secondary | ICD-10-CM | POA: Diagnosis not present

## 2022-05-29 DIAGNOSIS — M6281 Muscle weakness (generalized): Secondary | ICD-10-CM | POA: Diagnosis not present

## 2022-05-29 DIAGNOSIS — Z9181 History of falling: Secondary | ICD-10-CM | POA: Diagnosis not present

## 2022-06-03 DIAGNOSIS — G309 Alzheimer's disease, unspecified: Secondary | ICD-10-CM | POA: Diagnosis not present

## 2022-06-03 DIAGNOSIS — Z9181 History of falling: Secondary | ICD-10-CM | POA: Diagnosis not present

## 2022-06-03 DIAGNOSIS — R41841 Cognitive communication deficit: Secondary | ICD-10-CM | POA: Diagnosis not present

## 2022-06-03 DIAGNOSIS — M6281 Muscle weakness (generalized): Secondary | ICD-10-CM | POA: Diagnosis not present

## 2022-06-03 DIAGNOSIS — M9701XD Periprosthetic fracture around internal prosthetic right hip joint, subsequent encounter: Secondary | ICD-10-CM | POA: Diagnosis not present

## 2022-06-04 DIAGNOSIS — G309 Alzheimer's disease, unspecified: Secondary | ICD-10-CM | POA: Diagnosis not present

## 2022-06-04 DIAGNOSIS — R41841 Cognitive communication deficit: Secondary | ICD-10-CM | POA: Diagnosis not present

## 2022-06-04 DIAGNOSIS — M9701XD Periprosthetic fracture around internal prosthetic right hip joint, subsequent encounter: Secondary | ICD-10-CM | POA: Diagnosis not present

## 2022-06-04 DIAGNOSIS — M6281 Muscle weakness (generalized): Secondary | ICD-10-CM | POA: Diagnosis not present

## 2022-06-04 DIAGNOSIS — Z9181 History of falling: Secondary | ICD-10-CM | POA: Diagnosis not present

## 2022-06-05 ENCOUNTER — Telehealth: Payer: Self-pay | Admitting: Diagnostic Neuroimaging

## 2022-06-05 DIAGNOSIS — G309 Alzheimer's disease, unspecified: Secondary | ICD-10-CM | POA: Diagnosis not present

## 2022-06-05 DIAGNOSIS — R41841 Cognitive communication deficit: Secondary | ICD-10-CM | POA: Diagnosis not present

## 2022-06-05 DIAGNOSIS — M6281 Muscle weakness (generalized): Secondary | ICD-10-CM | POA: Diagnosis not present

## 2022-06-05 DIAGNOSIS — Z9181 History of falling: Secondary | ICD-10-CM | POA: Diagnosis not present

## 2022-06-05 DIAGNOSIS — M9701XD Periprosthetic fracture around internal prosthetic right hip joint, subsequent encounter: Secondary | ICD-10-CM | POA: Diagnosis not present

## 2022-06-05 NOTE — Telephone Encounter (Signed)
Pt son Griffon Herberg) is calling said Pt is sleeping until 2 pm in the day should they let him sleep or wake him up? And they have other question that they need answer from the nurse when she calls back.

## 2022-06-06 DIAGNOSIS — R41841 Cognitive communication deficit: Secondary | ICD-10-CM | POA: Diagnosis not present

## 2022-06-06 DIAGNOSIS — G309 Alzheimer's disease, unspecified: Secondary | ICD-10-CM | POA: Diagnosis not present

## 2022-06-06 DIAGNOSIS — Z9181 History of falling: Secondary | ICD-10-CM | POA: Diagnosis not present

## 2022-06-06 DIAGNOSIS — M6281 Muscle weakness (generalized): Secondary | ICD-10-CM | POA: Diagnosis not present

## 2022-06-06 DIAGNOSIS — M9701XD Periprosthetic fracture around internal prosthetic right hip joint, subsequent encounter: Secondary | ICD-10-CM | POA: Diagnosis not present

## 2022-06-06 NOTE — Telephone Encounter (Signed)
Contacted pt son back, informed him it would be best to try to get him up in the morning so he will have a more normal sleep cycle at night. Advised him to be more active through the day, light exercise, increase social activities, brain stimulation, games, puzzles, hobbies, crafts, arts, music and any physical activities through the day. Pt son wants to know if there is anything else that can be done do assist pt with sever fatigue and just wanting to lay around until mid afternoon. Please advise

## 2022-06-07 DIAGNOSIS — M6281 Muscle weakness (generalized): Secondary | ICD-10-CM | POA: Diagnosis not present

## 2022-06-07 DIAGNOSIS — F02B3 Dementia in other diseases classified elsewhere, moderate, with mood disturbance: Secondary | ICD-10-CM | POA: Diagnosis not present

## 2022-06-07 DIAGNOSIS — M9701XD Periprosthetic fracture around internal prosthetic right hip joint, subsequent encounter: Secondary | ICD-10-CM | POA: Diagnosis not present

## 2022-06-07 DIAGNOSIS — G309 Alzheimer's disease, unspecified: Secondary | ICD-10-CM | POA: Diagnosis not present

## 2022-06-07 DIAGNOSIS — R41841 Cognitive communication deficit: Secondary | ICD-10-CM | POA: Diagnosis not present

## 2022-06-07 DIAGNOSIS — F321 Major depressive disorder, single episode, moderate: Secondary | ICD-10-CM | POA: Diagnosis not present

## 2022-06-07 DIAGNOSIS — Z9181 History of falling: Secondary | ICD-10-CM | POA: Diagnosis not present

## 2022-06-10 DIAGNOSIS — G309 Alzheimer's disease, unspecified: Secondary | ICD-10-CM | POA: Diagnosis not present

## 2022-06-10 DIAGNOSIS — Z9181 History of falling: Secondary | ICD-10-CM | POA: Diagnosis not present

## 2022-06-10 DIAGNOSIS — M6281 Muscle weakness (generalized): Secondary | ICD-10-CM | POA: Diagnosis not present

## 2022-06-10 DIAGNOSIS — M9701XD Periprosthetic fracture around internal prosthetic right hip joint, subsequent encounter: Secondary | ICD-10-CM | POA: Diagnosis not present

## 2022-06-10 DIAGNOSIS — R41841 Cognitive communication deficit: Secondary | ICD-10-CM | POA: Diagnosis not present

## 2022-06-11 DIAGNOSIS — M6281 Muscle weakness (generalized): Secondary | ICD-10-CM | POA: Diagnosis not present

## 2022-06-11 DIAGNOSIS — R41841 Cognitive communication deficit: Secondary | ICD-10-CM | POA: Diagnosis not present

## 2022-06-11 DIAGNOSIS — Z9181 History of falling: Secondary | ICD-10-CM | POA: Diagnosis not present

## 2022-06-11 DIAGNOSIS — G309 Alzheimer's disease, unspecified: Secondary | ICD-10-CM | POA: Diagnosis not present

## 2022-06-11 DIAGNOSIS — M9701XD Periprosthetic fracture around internal prosthetic right hip joint, subsequent encounter: Secondary | ICD-10-CM | POA: Diagnosis not present

## 2022-06-12 DIAGNOSIS — G309 Alzheimer's disease, unspecified: Secondary | ICD-10-CM | POA: Diagnosis not present

## 2022-06-12 DIAGNOSIS — M6281 Muscle weakness (generalized): Secondary | ICD-10-CM | POA: Diagnosis not present

## 2022-06-12 DIAGNOSIS — M9701XD Periprosthetic fracture around internal prosthetic right hip joint, subsequent encounter: Secondary | ICD-10-CM | POA: Diagnosis not present

## 2022-06-12 DIAGNOSIS — R41841 Cognitive communication deficit: Secondary | ICD-10-CM | POA: Diagnosis not present

## 2022-06-12 DIAGNOSIS — Z9181 History of falling: Secondary | ICD-10-CM | POA: Diagnosis not present

## 2022-06-13 DIAGNOSIS — G309 Alzheimer's disease, unspecified: Secondary | ICD-10-CM | POA: Diagnosis not present

## 2022-06-13 DIAGNOSIS — R41841 Cognitive communication deficit: Secondary | ICD-10-CM | POA: Diagnosis not present

## 2022-06-13 DIAGNOSIS — Z9181 History of falling: Secondary | ICD-10-CM | POA: Diagnosis not present

## 2022-06-13 DIAGNOSIS — M9701XD Periprosthetic fracture around internal prosthetic right hip joint, subsequent encounter: Secondary | ICD-10-CM | POA: Diagnosis not present

## 2022-06-13 DIAGNOSIS — M6281 Muscle weakness (generalized): Secondary | ICD-10-CM | POA: Diagnosis not present

## 2022-06-14 DIAGNOSIS — F0393 Unspecified dementia, unspecified severity, with mood disturbance: Secondary | ICD-10-CM | POA: Diagnosis not present

## 2022-06-14 DIAGNOSIS — Z8781 Personal history of (healed) traumatic fracture: Secondary | ICD-10-CM | POA: Diagnosis not present

## 2022-06-14 DIAGNOSIS — R627 Adult failure to thrive: Secondary | ICD-10-CM | POA: Diagnosis not present

## 2022-06-14 DIAGNOSIS — E039 Hypothyroidism, unspecified: Secondary | ICD-10-CM | POA: Diagnosis not present

## 2022-06-17 DIAGNOSIS — G309 Alzheimer's disease, unspecified: Secondary | ICD-10-CM | POA: Diagnosis not present

## 2022-06-17 DIAGNOSIS — M6281 Muscle weakness (generalized): Secondary | ICD-10-CM | POA: Diagnosis not present

## 2022-06-17 DIAGNOSIS — E039 Hypothyroidism, unspecified: Secondary | ICD-10-CM | POA: Diagnosis not present

## 2022-06-17 DIAGNOSIS — E559 Vitamin D deficiency, unspecified: Secondary | ICD-10-CM | POA: Diagnosis not present

## 2022-06-17 DIAGNOSIS — Z9181 History of falling: Secondary | ICD-10-CM | POA: Diagnosis not present

## 2022-06-17 DIAGNOSIS — M9701XD Periprosthetic fracture around internal prosthetic right hip joint, subsequent encounter: Secondary | ICD-10-CM | POA: Diagnosis not present

## 2022-06-17 DIAGNOSIS — R41841 Cognitive communication deficit: Secondary | ICD-10-CM | POA: Diagnosis not present

## 2022-06-17 DIAGNOSIS — R634 Abnormal weight loss: Secondary | ICD-10-CM | POA: Diagnosis not present

## 2022-06-18 DIAGNOSIS — M9701XD Periprosthetic fracture around internal prosthetic right hip joint, subsequent encounter: Secondary | ICD-10-CM | POA: Diagnosis not present

## 2022-06-18 DIAGNOSIS — R41841 Cognitive communication deficit: Secondary | ICD-10-CM | POA: Diagnosis not present

## 2022-06-18 DIAGNOSIS — G309 Alzheimer's disease, unspecified: Secondary | ICD-10-CM | POA: Diagnosis not present

## 2022-06-18 DIAGNOSIS — Z9181 History of falling: Secondary | ICD-10-CM | POA: Diagnosis not present

## 2022-06-18 DIAGNOSIS — M6281 Muscle weakness (generalized): Secondary | ICD-10-CM | POA: Diagnosis not present

## 2022-06-19 DIAGNOSIS — Z9181 History of falling: Secondary | ICD-10-CM | POA: Diagnosis not present

## 2022-06-19 DIAGNOSIS — G309 Alzheimer's disease, unspecified: Secondary | ICD-10-CM | POA: Diagnosis not present

## 2022-06-19 DIAGNOSIS — M6281 Muscle weakness (generalized): Secondary | ICD-10-CM | POA: Diagnosis not present

## 2022-06-19 DIAGNOSIS — R41841 Cognitive communication deficit: Secondary | ICD-10-CM | POA: Diagnosis not present

## 2022-06-19 DIAGNOSIS — M9701XD Periprosthetic fracture around internal prosthetic right hip joint, subsequent encounter: Secondary | ICD-10-CM | POA: Diagnosis not present

## 2022-06-20 DIAGNOSIS — M6281 Muscle weakness (generalized): Secondary | ICD-10-CM | POA: Diagnosis not present

## 2022-06-20 DIAGNOSIS — Z9181 History of falling: Secondary | ICD-10-CM | POA: Diagnosis not present

## 2022-06-20 DIAGNOSIS — R41841 Cognitive communication deficit: Secondary | ICD-10-CM | POA: Diagnosis not present

## 2022-06-20 DIAGNOSIS — G309 Alzheimer's disease, unspecified: Secondary | ICD-10-CM | POA: Diagnosis not present

## 2022-06-20 DIAGNOSIS — M9701XD Periprosthetic fracture around internal prosthetic right hip joint, subsequent encounter: Secondary | ICD-10-CM | POA: Diagnosis not present

## 2022-06-24 DIAGNOSIS — G309 Alzheimer's disease, unspecified: Secondary | ICD-10-CM | POA: Diagnosis not present

## 2022-06-24 DIAGNOSIS — R41841 Cognitive communication deficit: Secondary | ICD-10-CM | POA: Diagnosis not present

## 2022-06-28 DIAGNOSIS — R41841 Cognitive communication deficit: Secondary | ICD-10-CM | POA: Diagnosis not present

## 2022-06-28 DIAGNOSIS — G309 Alzheimer's disease, unspecified: Secondary | ICD-10-CM | POA: Diagnosis not present

## 2022-07-01 DIAGNOSIS — R41841 Cognitive communication deficit: Secondary | ICD-10-CM | POA: Diagnosis not present

## 2022-07-01 DIAGNOSIS — G309 Alzheimer's disease, unspecified: Secondary | ICD-10-CM | POA: Diagnosis not present

## 2022-07-03 DIAGNOSIS — G309 Alzheimer's disease, unspecified: Secondary | ICD-10-CM | POA: Diagnosis not present

## 2022-07-03 DIAGNOSIS — R41841 Cognitive communication deficit: Secondary | ICD-10-CM | POA: Diagnosis not present

## 2022-07-05 DIAGNOSIS — R41841 Cognitive communication deficit: Secondary | ICD-10-CM | POA: Diagnosis not present

## 2022-07-05 DIAGNOSIS — G309 Alzheimer's disease, unspecified: Secondary | ICD-10-CM | POA: Diagnosis not present

## 2022-07-08 DIAGNOSIS — G309 Alzheimer's disease, unspecified: Secondary | ICD-10-CM | POA: Diagnosis not present

## 2022-07-08 DIAGNOSIS — R41841 Cognitive communication deficit: Secondary | ICD-10-CM | POA: Diagnosis not present

## 2022-07-10 DIAGNOSIS — R41841 Cognitive communication deficit: Secondary | ICD-10-CM | POA: Diagnosis not present

## 2022-07-10 DIAGNOSIS — G309 Alzheimer's disease, unspecified: Secondary | ICD-10-CM | POA: Diagnosis not present

## 2022-07-12 DIAGNOSIS — G309 Alzheimer's disease, unspecified: Secondary | ICD-10-CM | POA: Diagnosis not present

## 2022-07-12 DIAGNOSIS — R41841 Cognitive communication deficit: Secondary | ICD-10-CM | POA: Diagnosis not present

## 2022-07-22 ENCOUNTER — Telehealth: Payer: Self-pay | Admitting: Diagnostic Neuroimaging

## 2022-07-22 NOTE — Telephone Encounter (Signed)
Pt's son, Tim Lair (on [place]) Would like a call from the nurse to discuss Staff having difficulty waking in the morning to take medication. Want to get advice on how to handle this.

## 2022-07-22 NOTE — Telephone Encounter (Signed)
Per TE 06/05/22, I informed son it would be best to try to get him up in the morning so he will have a more normal sleep cycle at night. Advised him to be more active through the day, light exercise, increase social activities, brain stimulation, games, puzzles, hobbies, crafts, arts, music and any physical activities through the day. Son states he is still having sever fatigue and they cannot get pt up in AM. He is needing other suggestions on what to. Please advise

## 2022-07-23 NOTE — Telephone Encounter (Signed)
Spoke to Dr Leta Baptist, he stated he will contact son to discuss.

## 2022-07-25 DIAGNOSIS — E039 Hypothyroidism, unspecified: Secondary | ICD-10-CM | POA: Diagnosis not present

## 2022-07-25 DIAGNOSIS — E785 Hyperlipidemia, unspecified: Secondary | ICD-10-CM | POA: Diagnosis not present

## 2022-07-25 DIAGNOSIS — I340 Nonrheumatic mitral (valve) insufficiency: Secondary | ICD-10-CM | POA: Diagnosis not present

## 2022-08-16 DIAGNOSIS — Z23 Encounter for immunization: Secondary | ICD-10-CM | POA: Diagnosis not present

## 2022-08-23 DIAGNOSIS — F02B3 Dementia in other diseases classified elsewhere, moderate, with mood disturbance: Secondary | ICD-10-CM | POA: Diagnosis not present

## 2022-08-23 DIAGNOSIS — F321 Major depressive disorder, single episode, moderate: Secondary | ICD-10-CM | POA: Diagnosis not present

## 2022-08-23 DIAGNOSIS — G309 Alzheimer's disease, unspecified: Secondary | ICD-10-CM | POA: Diagnosis not present

## 2022-09-05 DIAGNOSIS — Z23 Encounter for immunization: Secondary | ICD-10-CM | POA: Diagnosis not present

## 2022-09-27 DIAGNOSIS — G309 Alzheimer's disease, unspecified: Secondary | ICD-10-CM | POA: Diagnosis not present

## 2022-09-27 DIAGNOSIS — F321 Major depressive disorder, single episode, moderate: Secondary | ICD-10-CM | POA: Diagnosis not present

## 2022-09-27 DIAGNOSIS — F02B3 Dementia in other diseases classified elsewhere, moderate, with mood disturbance: Secondary | ICD-10-CM | POA: Diagnosis not present

## 2023-07-27 ENCOUNTER — Other Ambulatory Visit: Payer: Self-pay

## 2023-07-27 ENCOUNTER — Emergency Department (HOSPITAL_COMMUNITY): Payer: Medicare Other

## 2023-07-27 ENCOUNTER — Encounter (HOSPITAL_COMMUNITY): Payer: Self-pay

## 2023-07-27 ENCOUNTER — Inpatient Hospital Stay (HOSPITAL_COMMUNITY)
Admission: EM | Admit: 2023-07-27 | Discharge: 2023-07-30 | DRG: 640 | Disposition: A | Discharge status: Hospice | Payer: Medicare Other | Source: Skilled Nursing Facility | Attending: Family Medicine | Admitting: Family Medicine

## 2023-07-27 DIAGNOSIS — R32 Unspecified urinary incontinence: Secondary | ICD-10-CM | POA: Diagnosis present

## 2023-07-27 DIAGNOSIS — G40209 Localization-related (focal) (partial) symptomatic epilepsy and epileptic syndromes with complex partial seizures, not intractable, without status epilepticus: Secondary | ICD-10-CM | POA: Diagnosis present

## 2023-07-27 DIAGNOSIS — Z66 Do not resuscitate: Secondary | ICD-10-CM | POA: Diagnosis present

## 2023-07-27 DIAGNOSIS — Z5320 Procedure and treatment not carried out because of patient's decision for unspecified reasons: Secondary | ICD-10-CM | POA: Diagnosis present

## 2023-07-27 DIAGNOSIS — E46 Unspecified protein-calorie malnutrition: Secondary | ICD-10-CM

## 2023-07-27 DIAGNOSIS — E871 Hypo-osmolality and hyponatremia: Principal | ICD-10-CM | POA: Diagnosis present

## 2023-07-27 DIAGNOSIS — R9431 Abnormal electrocardiogram [ECG] [EKG]: Secondary | ICD-10-CM

## 2023-07-27 DIAGNOSIS — Z79899 Other long term (current) drug therapy: Secondary | ICD-10-CM

## 2023-07-27 DIAGNOSIS — K219 Gastro-esophageal reflux disease without esophagitis: Secondary | ICD-10-CM | POA: Diagnosis present

## 2023-07-27 DIAGNOSIS — K2270 Barrett's esophagus without dysplasia: Secondary | ICD-10-CM | POA: Diagnosis present

## 2023-07-27 DIAGNOSIS — R64 Cachexia: Secondary | ICD-10-CM | POA: Diagnosis present

## 2023-07-27 DIAGNOSIS — D72829 Elevated white blood cell count, unspecified: Secondary | ICD-10-CM | POA: Diagnosis present

## 2023-07-27 DIAGNOSIS — E870 Hyperosmolality and hypernatremia: Principal | ICD-10-CM | POA: Diagnosis present

## 2023-07-27 DIAGNOSIS — Z9181 History of falling: Secondary | ICD-10-CM

## 2023-07-27 DIAGNOSIS — E785 Hyperlipidemia, unspecified: Secondary | ICD-10-CM | POA: Diagnosis present

## 2023-07-27 DIAGNOSIS — L98429 Non-pressure chronic ulcer of back with unspecified severity: Secondary | ICD-10-CM | POA: Diagnosis present

## 2023-07-27 DIAGNOSIS — H903 Sensorineural hearing loss, bilateral: Secondary | ICD-10-CM | POA: Diagnosis present

## 2023-07-27 DIAGNOSIS — Z96641 Presence of right artificial hip joint: Secondary | ICD-10-CM | POA: Diagnosis present

## 2023-07-27 DIAGNOSIS — M19071 Primary osteoarthritis, right ankle and foot: Secondary | ICD-10-CM | POA: Diagnosis present

## 2023-07-27 DIAGNOSIS — E039 Hypothyroidism, unspecified: Secondary | ICD-10-CM | POA: Diagnosis present

## 2023-07-27 DIAGNOSIS — R531 Weakness: Secondary | ICD-10-CM

## 2023-07-27 DIAGNOSIS — Z79890 Hormone replacement therapy (postmenopausal): Secondary | ICD-10-CM

## 2023-07-27 DIAGNOSIS — Z515 Encounter for palliative care: Secondary | ICD-10-CM

## 2023-07-27 DIAGNOSIS — Z7982 Long term (current) use of aspirin: Secondary | ICD-10-CM

## 2023-07-27 DIAGNOSIS — Z7189 Other specified counseling: Secondary | ICD-10-CM

## 2023-07-27 DIAGNOSIS — F0390 Unspecified dementia without behavioral disturbance: Secondary | ICD-10-CM | POA: Diagnosis present

## 2023-07-27 DIAGNOSIS — Y92121 Bathroom in nursing home as the place of occurrence of the external cause: Secondary | ICD-10-CM

## 2023-07-27 DIAGNOSIS — E860 Dehydration: Secondary | ICD-10-CM | POA: Diagnosis present

## 2023-07-27 DIAGNOSIS — N400 Enlarged prostate without lower urinary tract symptoms: Secondary | ICD-10-CM | POA: Diagnosis present

## 2023-07-27 DIAGNOSIS — F32A Depression, unspecified: Secondary | ICD-10-CM | POA: Diagnosis present

## 2023-07-27 DIAGNOSIS — Z681 Body mass index (BMI) 19 or less, adult: Secondary | ICD-10-CM

## 2023-07-27 DIAGNOSIS — J4489 Other specified chronic obstructive pulmonary disease: Secondary | ICD-10-CM | POA: Diagnosis present

## 2023-07-27 DIAGNOSIS — S50311A Abrasion of right elbow, initial encounter: Secondary | ICD-10-CM | POA: Diagnosis present

## 2023-07-27 DIAGNOSIS — E861 Hypovolemia: Secondary | ICD-10-CM | POA: Diagnosis present

## 2023-07-27 DIAGNOSIS — E43 Unspecified severe protein-calorie malnutrition: Secondary | ICD-10-CM | POA: Diagnosis present

## 2023-07-27 DIAGNOSIS — Z882 Allergy status to sulfonamides status: Secondary | ICD-10-CM

## 2023-07-27 DIAGNOSIS — Z888 Allergy status to other drugs, medicaments and biological substances status: Secondary | ICD-10-CM

## 2023-07-27 DIAGNOSIS — W19XXXA Unspecified fall, initial encounter: Principal | ICD-10-CM | POA: Diagnosis present

## 2023-07-27 LAB — CBC WITH DIFFERENTIAL/PLATELET
Abs Immature Granulocytes: 0.08 10*3/uL — ABNORMAL HIGH (ref 0.00–0.07)
Basophils Absolute: 0 10*3/uL (ref 0.0–0.1)
Basophils Relative: 0 %
Eosinophils Absolute: 0 10*3/uL (ref 0.0–0.5)
Eosinophils Relative: 0 %
HCT: 44.1 % (ref 39.0–52.0)
Hemoglobin: 12.9 g/dL — ABNORMAL LOW (ref 13.0–17.0)
Immature Granulocytes: 1 %
Lymphocytes Relative: 4 %
Lymphs Abs: 0.5 10*3/uL — ABNORMAL LOW (ref 0.7–4.0)
MCH: 27.2 pg (ref 26.0–34.0)
MCHC: 29.3 g/dL — ABNORMAL LOW (ref 30.0–36.0)
MCV: 92.8 fL (ref 80.0–100.0)
Monocytes Absolute: 0.4 10*3/uL (ref 0.1–1.0)
Monocytes Relative: 3 %
Neutro Abs: 13.4 10*3/uL — ABNORMAL HIGH (ref 1.7–7.7)
Neutrophils Relative %: 92 %
Platelets: 275 10*3/uL (ref 150–400)
RBC: 4.75 MIL/uL (ref 4.22–5.81)
RDW: 14.5 % (ref 11.5–15.5)
WBC: 14.5 10*3/uL — ABNORMAL HIGH (ref 4.0–10.5)
nRBC: 0 % (ref 0.0–0.2)

## 2023-07-27 LAB — COMPREHENSIVE METABOLIC PANEL
ALT: 19 U/L (ref 0–44)
AST: 26 U/L (ref 15–41)
Albumin: 2.8 g/dL — ABNORMAL LOW (ref 3.5–5.0)
Alkaline Phosphatase: 82 U/L (ref 38–126)
Anion gap: 13 (ref 5–15)
BUN: 29 mg/dL — ABNORMAL HIGH (ref 8–23)
CO2: 27 mmol/L (ref 22–32)
Calcium: 9.1 mg/dL (ref 8.9–10.3)
Chloride: 113 mmol/L — ABNORMAL HIGH (ref 98–111)
Creatinine, Ser: 0.85 mg/dL (ref 0.61–1.24)
GFR, Estimated: >60 mL/min (ref >60)
Glucose, Bld: 108 mg/dL — ABNORMAL HIGH (ref 70–99)
Potassium: 3.4 mmol/L — ABNORMAL LOW (ref 3.5–5.1)
Sodium: 153 mmol/L — ABNORMAL HIGH (ref 135–145)
Total Bilirubin: 0.6 mg/dL (ref 0.3–1.2)
Total Protein: 6.5 g/dL (ref 6.5–8.1)

## 2023-07-27 LAB — BASIC METABOLIC PANEL
Anion gap: 16 — ABNORMAL HIGH (ref 5–15)
BUN: 30 mg/dL — ABNORMAL HIGH (ref 8–23)
CO2: 22 mmol/L (ref 22–32)
Calcium: 9.1 mg/dL (ref 8.9–10.3)
Chloride: 114 mmol/L — ABNORMAL HIGH (ref 98–111)
Creatinine, Ser: 0.9 mg/dL (ref 0.61–1.24)
GFR, Estimated: >60 mL/min (ref >60)
Glucose, Bld: 98 mg/dL (ref 70–99)
Potassium: 3.8 mmol/L (ref 3.5–5.1)
Sodium: 152 mmol/L — ABNORMAL HIGH (ref 135–145)

## 2023-07-27 MED ORDER — DONEPEZIL HCL 10 MG PO TABS: 10.0000 mg | ORAL_TABLET | Freq: Every day | ORAL | Status: DC

## 2023-07-27 MED ORDER — CROMOLYN SODIUM 5.2 MG/ACT NA AERS: 1.0000 | INHALATION_SPRAY | Freq: Two times a day (BID) | NASAL | Status: DC

## 2023-07-27 MED ORDER — DOCUSATE SODIUM 100 MG PO CAPS
100.0000 mg | ORAL_CAPSULE | Freq: Every day | ORAL | Status: DC
  Filled 2023-07-27 (×2): qty 1

## 2023-07-27 MED ORDER — LACTATED RINGERS IV SOLN: INTRAVENOUS | Status: AC

## 2023-07-27 MED ORDER — SODIUM CHLORIDE 0.9 % IV SOLN
350.0000 mg | Freq: Once | INTRAVENOUS | Status: AC
  Administered 2023-07-28: 350 mg via INTRAVENOUS
  Filled 2023-07-27: qty 3.5

## 2023-07-27 MED ORDER — DEXTROSE 5 % IV SOLN: INTRAVENOUS | Status: DC

## 2023-07-27 MED ORDER — ACETAMINOPHEN 500 MG PO TABS
500.0000 mg | ORAL_TABLET | Freq: Three times a day (TID) | ORAL | Status: DC | PRN
  Administered 2023-07-29: 500 mg via ORAL
  Filled 2023-07-27: qty 1

## 2023-07-27 MED ORDER — SODIUM CHLORIDE 0.9 % IV SOLN: 750.0000 mg | Freq: Once | INTRAVENOUS | Status: DC

## 2023-07-27 MED ORDER — LEVETIRACETAM ER 750 MG PO TB24
750.0000 mg | ORAL_TABLET | Freq: Every day | ORAL | Status: DC
  Filled 2023-07-27: qty 1

## 2023-07-27 MED ORDER — FINASTERIDE 5 MG PO TABS
5.0000 mg | ORAL_TABLET | Freq: Every day | ORAL | Status: DC
  Administered 2023-07-28 – 2023-07-29 (×2): 5 mg via ORAL
  Filled 2023-07-27 (×2): qty 1

## 2023-07-27 MED ORDER — LEVETIRACETAM 750 MG PO TABS: 750.0000 mg | ORAL_TABLET | Freq: Every day | ORAL | Status: DC

## 2023-07-27 MED ORDER — PRAVASTATIN SODIUM 10 MG PO TABS: 20.0000 mg | ORAL_TABLET | Freq: Every day | ORAL | Status: DC

## 2023-07-27 MED ORDER — MEMANTINE HCL 10 MG PO TABS
10.0000 mg | ORAL_TABLET | Freq: Two times a day (BID) | ORAL | Status: DC
  Administered 2023-07-28 – 2023-07-29 (×2): 10 mg via ORAL
  Filled 2023-07-27 (×5): qty 1

## 2023-07-27 MED ORDER — ASPIRIN 81 MG PO TBEC
81.0000 mg | DELAYED_RELEASE_TABLET | Freq: Every day | ORAL | Status: DC
  Administered 2023-07-28: 81 mg via ORAL
  Filled 2023-07-27 (×2): qty 1

## 2023-07-27 MED ORDER — LEVOTHYROXINE SODIUM 75 MCG PO TABS
75.0000 ug | ORAL_TABLET | Freq: Every day | ORAL | Status: DC
  Filled 2023-07-27: qty 1

## 2023-07-27 NOTE — Assessment & Plan Note (Signed)
Per nursing home, he fell earlier this morning in his bathroom.  Patient does not remember the circumstances surrounding this.  Previously fell a few weeks ago, but did not require hospitalization.  Tenderness is primarily located around right femur and knee.  Some scratches noted on right ankle.  Initial imaging negative for acute fracture of head, C-spine, hip, knee/ankle.  C-collar removed. EKG: NSR.  At present, can walk with two-person assist.  Uses a rolling walker at home. Per nursing home staff, patient has been eating and drinking progressively less over the past few months.  Per chart review, son reports that his father sometimes sleeps until 2 PM, and will frequently miss meals.  Nursing staff corroborates this -- he "has not felt like eating" for a significant period of time.  Patient uses adult diapers, is incontinent of bladder.  UA pending.  Most obvious concern is for occult malignancy, however extremely poor dentition may play a role as well.  XR chest negative.  As patient fell in the restroom, vasovagal component very possible.  No focal findings noted on physical exam in ED. - Patient stable, admitted to floor for observation/IVF/further workup. - Provided nursing home [facility] assisted living) with Hospital fax number, awaiting labs/notes. - Appears that patient is at psychiatric/neurological baseline. - Ordered delirium precautions. - Follow-up UA. - EEG ordered, patient has history of complex partial seizure. - PT/OT/SLP eval, follow-up. - RD consult after cleared for p.o. intake.

## 2023-07-27 NOTE — Assessment & Plan Note (Signed)
Patient has had reduced p.o. intake since at least January of this year.  Has lost 30 pounds.  Was put on mirtazapine and Ensure 3 times daily.  Son: "Just does not feel like eating."  SLP consulted at ALF, son believes he can swallow but chooses not to. - SLP evaluation and follow-up.  NPO until then. - RD consult after patient cleared for p.o. intake. - Continue mirtazapine 30 daily for appetite stimulation.

## 2023-07-27 NOTE — Hospital Course (Addendum)
[name] is a 89 y.o. male with a past medical history of progressive dementia and poor p.o. intake living at ALF, who was admitted to the [facility] Medicine Teaching Service at [facility] for fall. Hospital course is outlined below by problem:  Severe protein calorie malnutrition Patient has lost 30 pounds since January.  Put on mirtazapine and Ensure 3 times daily at ALF.  SLP consulted, made n.p.o. on admission 10/6.  Trial of thin liquids -- immediate coughing/wet vocal quality.  Required multiple swallows to clear pures.  Initial SLP eval: PO liquids provoked an immediate coughing and wet vocal quality, apparently patient's baseline.  Barium swallow showed severe dysphagia.  Does not protect airway fully.  Very limited options for therapy given advanced dementia.  After being informed of likely futility, and significant risk for aspiration, family opted for comfort feeds 10/7, dysphagia 1 diet.  Palliative care consulted, initial conversation with family.  Per family, patient once voiced that he would "like everything done".  After several conversations, family ***.  Fall Presented after being found down in the bathroom of his nursing home.  Intermittently able to answer orientation questions at that time.  On arrival to ED he was HDS and conversant.  Sodium 153, potassium 3.4, creatinine 0.85.  Hemoglobin stable from prior 12.5, white blood cell count 14.5. CT head/C-spine negative for acute finding.  Chest/hip/right knee x-rays without acute abnormality.  IV fluids initiated as below for dehydration and likely orthostasis.  EEG showed ***.  Hypernatremia Na: 153 on admit.  It appears that patient has difficulty adequately repleting fluids PO.  Worsened within a relatively short period of time.  Begun on continuous IV LR.  Begin every 6 hours BMP with eye towards switching to D5W ISO free water deficit of 2.7 L.  Prolonged QT interval QTc 577 on initial read, corrected to 419.  DC'd midodrine 10/6 for  QTc prolonging effects.  Can readd if necessary.  BPs soft, but stable.  Other conditions that were chronic and stable:  - SVT, s/p ablation: NSR on EKG *** - Complex partial seizure on long-term Keppra: Switched to IV Keppra ISO dysphagia.  Issues for follow up: ***

## 2023-07-27 NOTE — ED Triage Notes (Addendum)
PT BIB [facility] from [facility] for an unwitnessed fall.  Found in bathroom.  Hx of dementia.  EMS states alert and answering some orientation questions.  Pt complains of pain all over.  Only obvious inj. Is an abrasion to the right elbow with controlled oozing. C-collar in place  PT is HOH.  EMS VS: 120/84 HR 100

## 2023-07-27 NOTE — Assessment & Plan Note (Addendum)
QTc 577 ms.  QRS duration: 99 ms.  Also notable for: PACs, left anterior fascicular block, +/- right ventricular hypertrophy previous SVT status post ablation.  Has had no problems since then, per son.  We will recheck before DC.  No clinical symptomatology. - Follow-up repeat EKG today. - Avoid QTc prolonging medications.

## 2023-07-27 NOTE — H&P (Cosign Needed Addendum)
Hospital Admission History and Physical Service Pager: [phone]  Patient name: [name] Medical record number: [id number] Date of Birth: 10/04/1934 Age: 89 y.o. Gender: male  Primary Care Provider: Nadara Eaton, MD Consultants: None Code Status: Full code per nursing home  Preferred Emergency Contact: Danilo Cappiello, son ([phone])  Chief Complaint: Fall  Assessment and Plan: [name] is a 89 y.o. male with a past medical history of progressive dementia and poor p.o. intake living at ALF, 30 pound weight loss since January, right hip replacement, longstanding right foot/ankle injury, osteoarthritis, hypothyroidism, and SVT status post ablation who presented to the [facility] ED after an unwitnessed fall in his bathroom.  Differential for this patient's presentation of fall includes: Orthostasis in setting of hypovolemia and ?uti, muscle weakness and setting of 30 pound weight loss over the past year, vasovagal syncope, polypharmacy (donepezil, Norco), or related to history of seizure disorder.  Likeliest etiology is: combination of hypovolemia/orthostasis in the setting of profound cachexia and muscle weakness.  On workup, patient was hypernatremic to 153, and, per nursing staff, had not been eating or drinking well over the past few months.  Noted acute worsening over the past few days. Patient is very cachectic and weak on exam.  Patient's incontinence of bladder is baseline, afebrile, no dysuria: therefore, less likely UTI/infectious process.  Patient was admitted to MedSurg floor for observation/IV fluid/further workup. Assessment & Plan Fall, initial encounter Per nursing home, he fell earlier this morning in his bathroom.  Patient does not remember the circumstances surrounding this.  Previously fell a few weeks ago, but did not require hospitalization.  Tenderness is primarily located around right femur and knee.  Some scratches noted on right ankle.  Initial imaging  negative for acute fracture of head, C-spine, hip, knee/ankle.  C-collar removed. EKG: NSR.  At present, can walk with two-person assist.  Uses a rolling walker at home. Per nursing home staff, patient has been eating and drinking progressively less over the past few months.  Per chart review, son reports that his father sometimes sleeps until 2 PM, and will frequently miss meals.  Nursing staff corroborates this -- he "has not felt like eating" for a significant period of time.  Patient uses adult diapers, is incontinent of bladder.  UA pending.  Most obvious concern is for occult malignancy, however extremely poor dentition may play a role as well.  XR chest negative.  As patient fell in the restroom, vasovagal component very possible.  No focal findings noted on physical exam in ED. - Patient stable, admitted to floor for observation/IVF/further workup. - Provided nursing home [facility] assisted living) with Hospital fax number, awaiting labs/notes. - Appears that patient is at psychiatric/neurological baseline. - Ordered delirium precautions. - Follow-up UA. - EEG ordered, patient has history of complex partial seizure. - PT/OT/SLP eval, follow-up. - RD consult after cleared for p.o. intake. Hypernatremia Workup was significant for hypernatremic to 153.  Likely in setting of hypovolemia, although patient has had chronic poor p.o. intake since at least January. - Begin continuous IV LR 100 cc/hour (maintenance) - Taper once patient is cleared for p.o. fluids - Monitor I/Os Severe protein-calorie malnutrition (HCC) Patient has had reduced p.o. intake since at least January of this year.  Has lost 30 pounds.  Was put on mirtazapine and Ensure 3 times daily.  Son: "Just does not feel like eating."  SLP consulted at ALF, son believes he can swallow but chooses not  to. - SLP evaluation and follow-up.  NPO until then. - RD consult after patient cleared for p.o. intake. - Continue mirtazapine 30  daily for appetite stimulation. Leukocytosis, unspecified type WBC: 14.5.  Absolute neutrophil count: 13.4 H.  Patient afebrile.  Patient is regularly incontinent of urine, therefore less concern for UTI.  Less concern that this is secondary to malignancy as it is neutrophil-predominant, however, in context of a 30 pound weight loss since January, we will remain alert for the symptoms. - Repeat CBC. - Continue to monitor for other infectious symptoms including: Fever, dysuria, new cough, BP changes. Prolonged Q-T interval on ECG QTc 577 ms.  QRS duration: 99 ms.  Also notable for: PACs, left anterior fascicular block, +/- right ventricular hypertrophy previous SVT status post ablation.  Has had no problems since then, per son.  We will recheck before DC.  No clinical symptomatology. - Follow-up repeat EKG today. - Avoid QTc prolonging medications.  Chronic and Stable Conditions: SVT, s/p ablation: NSR on EKG Complex partial seizure on long-term Keppra: No focal findings.  No recent seizure, per son  FEN/GI: NPO pending swallow screen, IVF for now at 100 mL/hr VTE Prophylaxis: Lovenox  Disposition: Med-surg  History of Present Illness:  [name] is a 89 y.o. male presenting with fall.  Patient presented after he was found to have fallen in the bathroom of his assisted living facility.  At the time EMS arrived, he was reportedly alert and answering some orientation questions.  He did complain of pain all over and was found to have an abrasion of the right elbow with some oozing.  Patient feels he fell because he is older. He has fallen before, most recently several weeks ago. He states he went to the hospital for these before.  He has not been drinking much water recently. He has been eating at his normal baseline. He denies cough, headache, fever, other sick symptoms. He denies any changes in vision. He did not hit his head when he fell. He feels he has not lost much weight recently. No  chest pain, shortness of breath. No issues with using the restroom. He normally uses a walker to walk around at his nursing home.   In the ED, he arrived HDS. Na was 153. K 3.4. WBC 14.5. Hgb close to baseline at 12.9. EKG NSR. Head CT, CT c-spine, R hip and knee XR, and CXR unremarkable. He was called for admission for IVF administration given suspected dehydration/orthostasis.  On interview with nursing home, patient has been eating less and less over the past few days.  Noted difficulty swallowing.  Noted some coughing when he ate/drank. Speech therapy saw. On-site provider increased fluids.  Notably, he was also incontinent of bladder yesterday, which is not his baseline.  Did not receive a UA. Per nursing staff, they took some labs, will fax over.  No other infectious signs or symptoms.  Confirmed that son, [name] [phone]), is the primary contact.    On collateral interview with son: Patient fell earlier this morning at his ALF, primary concern at that time was back pain.  At baseline, patient can recognize self and family members with help. Appetite has declined significantly since January. Have added mirtazapine and encouraged to drink Ensure 3 times daily and as needed, but patient has lost significant weight anyway. Over the last 1 to 2 months, his sleep has been disrupted, occasionally sleeps until 2 PM.  Of note, patient has been drinking fewer and fewer  fluids, despite encouragement.  Likely secondary to advancing dementia.  Denies recent seizure.  No history of stroke.  Patient has longstanding right foot injury, complicated by osteoarthritis as well as right hip injury status post replacement.  Per son, patient has adult diapers on at home and is regularly incontinent of bladder.  Informs no incidents of SVT status post ablation.  Review Of Systems: Per HPI.  Current medications:  Amiodarone 2.5 BID Mirtazapine 30 at bedtime appetite Pravastatin 20 at bedtime Ensure q8 and  prn Levothyroxine 75 at bedtime Hydrocodone  Docusate 100 Donepezil 10 Finasteride 5  Tylenol prn Memantine  Aspirin 81 Miralax as needed Acidophelis  Pertinent Past Medical History: Acquired hypothyroidism Allergic rhinitis COPD Depression Dementia GERD Barrett's esophagus Colonic polyps Right ankle/foot osteoarthritis, secondary to longstanding remote injury 2018 HLD OA Seizures Sensorineural hearing loss bilateral SVT status post ablation BPH Remainder reviewed in history tab.   Pertinent Past Surgical History: Appendectomy SVT ablation 2019 Right hip replacement 2005 Remainder reviewed in history tab.   Pertinent Social History: Tobacco use: Never Alcohol use: Former Other Substance use: None Lives at [facility] assisted living  Pertinent Family History: Mother-heart disease, memory loss Sister-asthma, Parkinson's disease Remainder reviewed in history tab.   Important Outpatient Medications: Tylenol 500 every 6 as needed Aspirin 81 daily Align capsule daily Cholecalciferol 2000 units daily Cromolyn 1 spray both nostrils in the morning and at bedtime Colace 100 mg daily Donepezil 10 mg daily at bedtime Finasteride 5 mg daily Norco 5-325 mg 1 tablet as needed severe pain Keppra 750 mg at bedtime Synthroid 75 mcg daily Lidocaine patch on for 12 hours off for 12 hours Memantine 10 mg twice daily Toprol 25 mg daily Mirtazapine 7.5 mg at bedtime MiraLAX as needed Pravastatin 20 mg at bedtime  Objective: BP 107/64   Pulse (!) 101   Temp 97.6 F (36.4 C) (Axillary)   Resp 20   SpO2 99%  Exam: General: Cachectic appearing, lying in bed hunched to the side, NAD Eyes: Pupillary constriction at 2 mm bilaterally with difficult to appreciate reaction to light ENTM: Markedly poor dentition Neck: Supple, no cervical or supraclavicular lymphadenopathy appreciated Cardiovascular: Tachycardic to low 100s, regular rhythm, no murmurs rubs or  gallops Respiratory: Clear to auscultation bilaterally anteriorly, intermittently coughing on exam Gastrointestinal: Soft, nondistended, diffusely tender to palpation, normoactive bowel sounds MSK: Diffuse muscle wasting, able to move all extremities grossly equally Derm: Superficial abrasion to right elbow as well as overlying right knee without active bleeding/drainage Neuro: Following commands unable to anticipate an exam, strength at least 3 out of 5 throughout, sensation appears to be intact throughout Psych: Alert, oriented to name only, thinks he is in his nursing facility in 1937 of an unknown month in "[place]" as the city  Labs:  CBC BMET  Recent Labs  Lab 07/27/23 0900  WBC 14.5*  HGB 12.9*  HCT 44.1  PLT 275   Recent Labs  Lab 07/27/23 0900  NA 153*  K 3.4*  CL 113*  CO2 27  BUN 29*  CREATININE 0.85  GLUCOSE 108*  CALCIUM 9.1     Urinalysis pending  EKG: My own interpretation (not copied from electronic read): Normal sinus rhythm with occasional APC, regular rate, mild left axis deviation, no STEMI appreciated or new T wave inversions from prior, QTc 577  Imaging Studies Performed:  CT head/C-spine IMPRESSION: 1. No evidence of significant acute traumatic injury to the skull or brain. 2. No evidence of significant acute traumatic  injury to the cervical spine. 3. Severe cerebral atrophy with chronic microvascular ischemic changes in the cerebral white matter, as above. 4. Multilevel degenerative disc disease and cervical spondylosis, as above.  Hip XR IMPRESSION: Right hip arthroplasty.  Severe osteopenia.  CXR IMPRESSION: No acute cardiopulmonary disease.  Knee XR Right IMPRESSION: Mild degenerative changes. Greatest of the medial compartment. Severe osteopenia  Tomie China, MD 07/27/2023, 2:59 PM PGY-1, [facility] Family Medicine  FPTS Intern pager: [phone], text pages welcome Secure chat group [facility] Teaching  Service   I was personally present and performed or re-performed the history, physical exam and medical decision making activities of this service and have verified that the service and findings are accurately documented in the resident's note.  Janeal Holmes, MD                  07/27/2023, 5:56 PM PGY-2, [facility]

## 2023-07-27 NOTE — Progress Notes (Signed)
FMTS Interim Progress Note  S: resting in bed   O: BP 96/73 (BP Location: Right Arm)   Pulse (!) 104   Temp 98.8 F (37.1 C) (Oral)   Resp 16   Ht 5\' 9"  (1.753 m)   Wt 63.5 kg   SpO2 96%   BMI 20.67 kg/m   Sleeping comfortably  Cachetic in appearance   A/P: Hypernatremia to 153, on LR until hemodynamically stable then may need to switch to D5W to correct free water deficit of 2.7L. Unlikely to have euvolemic or hypervolemic hypernatremia, seems most likely related to poor hydration 2/2. Care instruction ordered to offer water PO q4hours   Alfredo Martinez, MD 07/27/2023, 9:47 PM PGY-3, [facility] Family Medicine Service pager [phone]

## 2023-07-27 NOTE — ED Provider Notes (Signed)
[place] [facility] DEPARTMENT AT [facility] Provider Note   CSN: [id number] Arrival date & time: 07/27/23  2440     History  Chief Complaint  Patient presents with   Marletta Lor    [name] is a 89 y.o. male.  Patient is an 89 year old male with a past medical history of dementia, hypothyroidism, SVT and seizure disorder presenting to the emergency department after a fall.  Patient was found this morning at his nursing home on the ground next to his bed.  His fall was not witnessed and unsure what made him fall.  He states that he had pain when they tried to move him but he was unable to specify where his pain was.  Further history is limited due to patient's dementia.  The history is provided by the EMS personnel and the nursing home. History limited by: Level 5 caveat for dementia.  Fall       Home Medications Prior to Admission medications   Medication Sig Start Date End Date Taking? Authorizing Provider  acetaminophen (TYLENOL) 500 MG tablet Take 500 mg by mouth every 6 (six) hours as needed for moderate pain.    [provider]  acetaminophen (TYLENOL) 650 MG CR tablet as needed. Patient not taking: Reported on 03/14/2022 12/05/20   [provider]  aspirin 81 MG EC tablet Take 81 mg by mouth daily. 12/06/20   [provider]  bifidobacterium infantis (ALIGN) capsule Take 1 capsule by mouth daily. 12/05/20   [provider]  Cholecalciferol 50 MCG (2000 UT) CAPS Take 2,000 Units by mouth daily. 12/05/20   [provider]  cromolyn (NASALCROM) 5.2 MG/ACT nasal spray Place 1 spray into both nostrils in the morning and at bedtime. 12/05/20   [provider]  docusate sodium (COLACE) 100 MG capsule Take 100 mg by mouth daily. 12/06/20   [provider]  donepezil (ARICEPT) 10 MG tablet Take 10 mg by mouth at bedtime.    [provider]  finasteride (PROSCAR) 5 MG tablet Take 5 mg by mouth daily.   Patient not taking: Reported on 03/14/2022 10/07/14   [provider]  HYDROcodone-acetaminophen (NORCO/VICODIN) 5-325 MG tablet Take 1 tablet by mouth every 6 (six) hours as needed for severe pain. 03/14/22   Gerhard Munch, MD  levETIRAcetam (KEPPRA) 100 MG/ML solution Take 7.5 mLs (750 mg total) by mouth at bedtime. 10/11/21   Penumalli, Glenford Bayley, MD  levothyroxine (SYNTHROID) 75 MCG tablet Take 75 mcg by mouth daily before breakfast. 12/06/20   [provider]  Lidocaine-Menthol 4-1 % PTCH Apply 1 patch topically See admin instructions. On for 12 hours and off for 12 hours    [provider]  memantine (NAMENDA) 5 MG tablet Take 10 mg by mouth 2 (two) times daily.    [provider]  metoprolol succinate (TOPROL-XL) 25 MG 24 hr tablet Take 25 mg by mouth daily. 12/06/20   [provider]  mirtazapine (REMERON) 15 MG tablet Take 7.5 mg by mouth at bedtime. 03/07/22   [provider]  polyethylene glycol powder (GLYCOLAX/MIRALAX) 17 GM/SCOOP powder as needed. 12/05/20   [provider]  pravastatin (PRAVACHOL) 20 MG tablet Take 20 mg by mouth at bedtime.    [provider]      Allergies    Dilantin [phenytoin sodium extended], Lamotrigine, Phenytoin sodium extended, Sulfa antibiotics, Sulfamethoxazole, and Sulfonamide derivatives    Review of Systems   Review of Systems  Physical Exam Updated  Vital Signs BP 107/64   Pulse (!) 101   Temp 98.4 F (36.9 C) (Oral)   Resp 20   SpO2 99%  Physical Exam Vitals and nursing note reviewed.  Constitutional:      General: He is not in acute distress.    Appearance: Normal appearance.  HENT:     Head: Normocephalic and atraumatic.     Nose: Nose normal.     Mouth/Throat:     Mouth: Mucous membranes are moist.     Pharynx: Oropharynx is clear.  Eyes:     Extraocular Movements: Extraocular movements intact.     Conjunctiva/sclera: Conjunctivae normal.     Pupils: Pupils  are equal, round, and reactive to light.  Neck:     Comments: No midline neck tenderness, C-collar in place Cardiovascular:     Rate and Rhythm: Normal rate and regular rhythm.     Heart sounds: Normal heart sounds.  Pulmonary:     Effort: Pulmonary effort is normal.     Breath sounds: Normal breath sounds.  Abdominal:     General: Abdomen is flat.     Palpations: Abdomen is soft.     Tenderness: There is no abdominal tenderness.  Musculoskeletal:     Comments: No midline Back tenderness No bony tenderness to bilateral upper extremities Pelvis stable, nontender Tenderness to palpation of right femur and knee with increased pain with hip internal/external rotation No tenderness to left lower extremity  Skin:    General: Skin is warm and dry.     Comments: Nonbleeding abrasion to right knee  Neurological:     General: No focal deficit present.     Mental Status: He is alert. Mental status is at baseline.     Sensory: No sensory deficit.     Motor: No weakness.  Psychiatric:        Mood and Affect: Mood normal.        Behavior: Behavior normal.     ED Results / Procedures / Treatments   Labs (all labs ordered are listed, but only abnormal results are displayed) Labs Reviewed  COMPREHENSIVE METABOLIC PANEL - Abnormal; Notable for the following components:      Result Value   Sodium 153 (*)    Potassium 3.4 (*)    Chloride 113 (*)    Glucose, Bld 108 (*)    BUN 29 (*)    Albumin 2.8 (*)    All other components within normal limits  CBC WITH DIFFERENTIAL/PLATELET - Abnormal; Notable for the following components:   WBC 14.5 (*)    Hemoglobin 12.9 (*)    MCHC 29.3 (*)    Neutro Abs 13.4 (*)    Lymphs Abs 0.5 (*)    Abs Immature Granulocytes 0.08 (*)    All other components within normal limits  URINALYSIS, ROUTINE W REFLEX MICROSCOPIC    EKG EKG Interpretation Date/Time:  Sunday July 27 2023 07:39:47 EDT Ventricular Rate:  90 PR Interval:  126 QRS  Duration:  99 QT Interval:  471 QTC Calculation: 577 R Axis:   -81  Text Interpretation: Sinus rhythm Atrial premature complex Left anterior fascicular block Consider right ventricular hypertrophy Borderline T abnormalities, inferior leads Prolonged QT interval Interpretation limited secondary to artifact Otherwise no significant change Confirmed by Elayne Snare ([id number]) on 07/27/2023 7:55:01 AM  Radiology DG Knee Complete 4 Views Right  Result Date: 07/27/2023 CLINICAL DATA:  Pain after fall EXAM: RIGHT KNEE - COMPLETE 4 VIEW COMPARISON:  None Available. FINDINGS:  Slight joint space loss of the medial compartment. Tiny osteophytes of all 3 compartments. No fracture or dislocation. No joint effusion on lateral view. Extensive vascular calcifications. Severe osteopenia. IMPRESSION: Mild degenerative changes. Greatest of the medial compartment. Severe osteopenia Electronically Signed   By: Karen Kays M.D.   On: 07/27/2023 08:59   DG Hip Unilat With Pelvis 2-3 Views Right  Result Date: 07/27/2023 CLINICAL DATA:  Pain after fall EXAM: DG HIP (WITH OR WITHOUT PELVIS) 3V RIGHT COMPARISON:  None Available. FINDINGS: Severe osteopenia. Right hip arthroplasty identified with Press-Fit femoral component and screw fixated acetabular cup. Expected alignment. No obvious hardware failure. Preserved joint spaces elsewhere. Overlapping cardiac leads. Extensive vascular calcifications. No fracture or dislocation. However with this level of severe osteopenia subtle nondisplaced injury is difficult to completely exclude and if needed follow-up cross-sectional imaging as clinically appropriate IMPRESSION: Right hip arthroplasty.  Severe osteopenia. Electronically Signed   By: Karen Kays M.D.   On: 07/27/2023 08:58   DG Chest 1 View  Result Date: 07/27/2023 CLINICAL DATA:  Unwitnessed fall EXAM: CHEST  1 VIEW COMPARISON:  02/25/2022 FINDINGS: S shaped thoracolumbar spine curvature. Midline trachea. Normal heart  size. Moderate right hemidiaphragm elevation. No pleural effusion or pneumothorax. Clear lungs. IMPRESSION: No acute cardiopulmonary disease. Electronically Signed   By: Jeronimo Greaves M.D.   On: 07/27/2023 08:56   CT Head Wo Contrast  Result Date: 07/27/2023 CLINICAL DATA:  89 year old male with history of minor head trauma. EXAM: CT HEAD WITHOUT CONTRAST CT CERVICAL SPINE WITHOUT CONTRAST TECHNIQUE: Multidetector CT imaging of the head and cervical spine was performed following the standard protocol without intravenous contrast. Multiplanar CT image reconstructions of the cervical spine were also generated. RADIATION DOSE REDUCTION: This exam was performed according to the departmental dose-optimization program which includes automated exposure control, adjustment of the mA and/or kV according to patient size and/or use of iterative reconstruction technique. COMPARISON:  MRI of the brain 12/12/2012. FINDINGS: CT HEAD FINDINGS Brain: Severe cerebral atrophy with ex vacuo dilatation of the ventricular system. Patchy and confluent areas of decreased attenuation are noted throughout the deep and periventricular white matter of the cerebral hemispheres bilaterally, compatible with chronic microvascular ischemic disease. No evidence of acute infarction, hemorrhage, hydrocephalus, extra-axial collection or mass lesion/mass effect. Vascular: No hyperdense vessel or unexpected calcification. Skull: Normal. Negative for fracture or focal lesion. Sinuses/Orbits: No acute finding. Other: None. CT CERVICAL SPINE FINDINGS Alignment: Mild exaggeration of normal cervical lordosis, likely positional. Alignment is otherwise anatomic. Skull base and vertebrae: No acute fracture. No primary bone lesion or focal pathologic process. Soft tissues and spinal canal: No prevertebral fluid or swelling. No visible canal hematoma. Disc levels: Multilevel degenerative disc disease, most pronounced at C5-C6 and C6-C7. Upper chest: Severe  multilevel facet arthropathy bilaterally. Other: Frothy secretions lying dependently in the hypopharynx. IMPRESSION: 1. No evidence of significant acute traumatic injury to the skull or brain. 2. No evidence of significant acute traumatic injury to the cervical spine. 3. Severe cerebral atrophy with chronic microvascular ischemic changes in the cerebral white matter, as above. 4. Multilevel degenerative disc disease and cervical spondylosis, as above. Electronically Signed   By: Trudie Reed M.D.   On: 07/27/2023 08:27   CT Cervical Spine Wo Contrast  Result Date: 07/27/2023 CLINICAL DATA:  89 year old male with history of minor head trauma. EXAM: CT HEAD WITHOUT CONTRAST CT CERVICAL SPINE WITHOUT CONTRAST TECHNIQUE: Multidetector CT imaging of the head and cervical spine was performed following the standard protocol  without intravenous contrast. Multiplanar CT image reconstructions of the cervical spine were also generated. RADIATION DOSE REDUCTION: This exam was performed according to the departmental dose-optimization program which includes automated exposure control, adjustment of the mA and/or kV according to patient size and/or use of iterative reconstruction technique. COMPARISON:  MRI of the brain 12/12/2012. FINDINGS: CT HEAD FINDINGS Brain: Severe cerebral atrophy with ex vacuo dilatation of the ventricular system. Patchy and confluent areas of decreased attenuation are noted throughout the deep and periventricular white matter of the cerebral hemispheres bilaterally, compatible with chronic microvascular ischemic disease. No evidence of acute infarction, hemorrhage, hydrocephalus, extra-axial collection or mass lesion/mass effect. Vascular: No hyperdense vessel or unexpected calcification. Skull: Normal. Negative for fracture or focal lesion. Sinuses/Orbits: No acute finding. Other: None. CT CERVICAL SPINE FINDINGS Alignment: Mild exaggeration of normal cervical lordosis, likely positional.  Alignment is otherwise anatomic. Skull base and vertebrae: No acute fracture. No primary bone lesion or focal pathologic process. Soft tissues and spinal canal: No prevertebral fluid or swelling. No visible canal hematoma. Disc levels: Multilevel degenerative disc disease, most pronounced at C5-C6 and C6-C7. Upper chest: Severe multilevel facet arthropathy bilaterally. Other: Frothy secretions lying dependently in the hypopharynx. IMPRESSION: 1. No evidence of significant acute traumatic injury to the skull or brain. 2. No evidence of significant acute traumatic injury to the cervical spine. 3. Severe cerebral atrophy with chronic microvascular ischemic changes in the cerebral white matter, as above. 4. Multilevel degenerative disc disease and cervical spondylosis, as above. Electronically Signed   By: Trudie Reed M.D.   On: 07/27/2023 08:27    Procedures Procedures    Medications Ordered in ED Medications - No data to display  ED Course/ Medical Decision Making/ A&P Clinical Course as of 07/27/23 1102  Sun Jul 27, 2023  7829 No acute traumatic injury on CT imaging. C-collar cleared by myself. [VK]  [id number] Degenerative changes without obvious fracture on x-rays. [VK]  [id number] Patient able to bear weight, normally walks with walker. Unlikely to have occult fracture. [VK]  1026 Leukocytosis on labs, urine is pending. Hypernatremia. Concerned infection and the electrolyte derangements could be leading to weakness that has caused his fall and will recommend admission. [VK]  14 Son does report his dad sleeps sometimes until 2PM and will frequently miss meals. Has had ~30 lb weight loss since January.  [VK]    Clinical Course User Index [VK] Rexford Maus, DO                                 Medical Decision Making This patient presents to the ED with chief complaint(s) of fall with pertinent past medical history of dementia, SVT, hypothyroid, seizure disorder which further complicates the  presenting complaint. The complaint involves an extensive differential diagnosis and also carries with it a high risk of complications and morbidity.    The differential diagnosis includes due to patient's age and fall concern for possible ICH, mass effect, cervical spine fracture, hip fracture, knee fracture, no other traumatic injury seen on exam, concern for possible syncopal fall, arrhythmia, anemia, dehydration, electrolyte abnormality  Additional history obtained: Additional history obtained from EMS  Records reviewed Nursing Home Documents  ED Course and Reassessment: On patient's arrival he is hemodynamically stable in no acute distress.  EKG on arrival showed normal sinus rhythm without acute ischemic changes.  Patient will have labs will have head CT and C-spine, right hip and  knee x-ray and chest x-ray to evaluate for cause of his fall and for any traumatic injuries and will be closely reassessed.  Independent labs interpretation:  The following labs were independently interpreted: hypernatremia, leukocytosis   Independent visualization of imaging: - I independently visualized the following imaging with scope of interpretation limited to determining acute life threatening conditions related to emergency care: CTH/C-spine, CXR, R hip/R knee XR, which revealed no acute traumatic injury  Consultation: - Consulted or discussed management/test interpretation w/ external professional: hospitalist  Consideration for admission or further workup: patient requires admission for hypernatremia Social Determinants of health: N/A    Amount and/or Complexity of Data Reviewed Labs: ordered. Radiology: ordered.  Risk Decision regarding hospitalization.          Final Clinical Impression(s) / ED Diagnoses Final diagnoses:  Fall, initial encounter  Hypernatremia    Rx / DC Orders ED Discharge Orders     None         Rexford Maus, DO 07/27/23 1102

## 2023-07-27 NOTE — Assessment & Plan Note (Signed)
WBC: 14.5.  Absolute neutrophil count: 13.4 H.  Patient afebrile.  Patient is regularly incontinent of urine, therefore less concern for UTI.  Less concern that this is secondary to malignancy as it is neutrophil-predominant, however, in context of a 30 pound weight loss since January, we will remain alert for the symptoms. - Repeat CBC. - Continue to monitor for other infectious symptoms including: Fever, dysuria, new cough, BP changes.

## 2023-07-27 NOTE — ED Notes (Signed)
ED TO INPATIENT HANDOFF REPORT  ED Nurse Name and Phone #: Dwana Melena [phone]  S Name/Age/Gender [name] 89 y.o. male Room/Bed: 038C/038C  Code Status   Code Status: Prior  Home/SNF/Other Nursing Home Patient oriented to: self Is this baseline? Yes   Triage Complete: Triage complete  Chief Complaint Fall [W19.XXXA]  Triage Note PT BIB [facility] from [facility] for an unwitnessed fall.  Found in bathroom.  Hx of dementia.  EMS states alert and answering some orientation questions.  Pt complains of pain all over.  Only obvious inj. Is an abrasion to the right elbow with controlled oozing. C-collar in place  PT is HOH.  EMS VS: 120/84 HR 100    Allergies Allergies  Allergen Reactions   Dilantin [Phenytoin Sodium Extended] Other (See Comments)    Reaction not recalled by the patient (??)   Lamotrigine Other (See Comments)    Drowsiness Drowsiness Drowsiness Drowsiness  Drowsiness   Phenytoin Sodium Extended Other (See Comments)    Reaction not recalled by the patient (??)   Sulfa Antibiotics Other (See Comments)    From childhood; reaction not recalled   Sulfamethoxazole Other (See Comments)    Other Other   Sulfonamide Derivatives Other (See Comments)    From childhood; reaction not recalled    Level of Care/Admitting Diagnosis ED Disposition     ED Disposition  Admit   Condition  --   Comment  Hospital Area: [facility] [[id number]]  Level of Care: Med-Surg [16]  May place patient in observation at [facility] or [facility] if equivalent level of care is available:: No  Covid Evaluation: Asymptomatic - no recent exposure (last 10 days) testing not required  Diagnosis: Fall [[id number]]  Admitting Physician: Tomie China [[id number]]  Attending Physician: Caro Laroche [[id number]]          B Medical/Surgery History Past Medical History:  Diagnosis Date   Acute bronchitis 09/05/2009   Qualifier: Diagnosis of   By: Maple Hudson MD, Clinton D    Allergic rhinitis    ALLERGIC RHINITIS 09/26/2007   Qualifier: Diagnosis of  By: Jerolyn Shin     Asthma    Asthma with bronchitis 09/26/2007   Qualifier: Diagnosis of  By: Jerolyn Shin     Barrett syndrome    BPH (benign prostatic hyperplasia)    COPD (chronic obstructive pulmonary disease) (HCC)    Dementia (HCC)    Depressive disorder, not elsewhere classified    Dizziness and giddiness    Esophageal reflux    Hypothyroid    Localization-related (focal) (partial) epilepsy and epileptic syndromes with complex partial seizures, with intractable epilepsy    Memory loss    Other and unspecified hyperlipidemia    Pain in right ankle and joints of right foot 03/03/2017   Post-traumatic osteoarthritis, right ankle and foot 03/03/2017   SEIZURE DISORDER 09/26/2007   Qualifier: Diagnosis of  By: Jerolyn Shin     Seizure disorder [facility])    SVT (supraventricular tachycardia) (HCC) 04/27/2018   Unspecified hypothyroidism    Past Surgical History:  Procedure Laterality Date   APPENDECTOMY     right hip replacement  2005   SVT ABLATION N/A 04/27/2018   Procedure: SVT ABLATION;  Surgeon: Marinus Maw, MD;  Location: [facility] INVASIVE CV LAB;  Service: Cardiovascular;  Laterality: N/A;     A IV Location/Drains/Wounds Patient Lines/Drains/Airways Status     Active Line/Drains/Airways     None  Intake/Output Last 24 hours No intake or output data in the 24 hours ending 07/27/23 1352  Labs/Imaging Results for orders placed or performed during the hospital encounter of 07/27/23 (from the past 48 hour(s))  Comprehensive metabolic panel     Status: Abnormal   Collection Time: 07/27/23  9:00 AM  Result Value Ref Range   Sodium 153 (H) 135 - 145 mmol/L   Potassium 3.4 (L) 3.5 - 5.1 mmol/L   Chloride 113 (H) 98 - 111 mmol/L   CO2 27 22 - 32 mmol/L   Glucose, Bld 108 (H) 70 - 99 mg/dL    Comment: Glucose reference range applies only to samples taken  after fasting for at least 8 hours.   BUN 29 (H) 8 - 23 mg/dL   Creatinine, Ser 6.04 0.61 - 1.24 mg/dL   Calcium 9.1 8.9 - 54.0 mg/dL   Total Protein 6.5 6.5 - 8.1 g/dL   Albumin 2.8 (L) 3.5 - 5.0 g/dL   AST 26 15 - 41 U/L   ALT 19 0 - 44 U/L   Alkaline Phosphatase 82 38 - 126 U/L   Total Bilirubin 0.6 0.3 - 1.2 mg/dL   GFR, Estimated >98 >11 mL/min    Comment: (NOTE) Calculated using the CKD-EPI Creatinine Equation (2021)    Anion gap 13 5 - 15    Comment: Performed at [facility], [address] [place], [place] [address]  CBC with Differential     Status: Abnormal   Collection Time: 07/27/23  9:00 AM  Result Value Ref Range   WBC 14.5 (H) 4.0 - 10.5 K/uL   RBC 4.75 4.22 - 5.81 MIL/uL   Hemoglobin 12.9 (L) 13.0 - 17.0 g/dL   HCT 29.5 62.1 - 30.8 %   MCV 92.8 80.0 - 100.0 fL   MCH 27.2 26.0 - 34.0 pg   MCHC 29.3 (L) 30.0 - 36.0 g/dL   RDW 65.7 84.6 - 96.2 %   Platelets 275 150 - 400 K/uL   nRBC 0.0 0.0 - 0.2 %   Neutrophils Relative % 92 %   Neutro Abs 13.4 (H) 1.7 - 7.7 K/uL   Lymphocytes Relative 4 %   Lymphs Abs 0.5 (L) 0.7 - 4.0 K/uL   Monocytes Relative 3 %   Monocytes Absolute 0.4 0.1 - 1.0 K/uL   Eosinophils Relative 0 %   Eosinophils Absolute 0.0 0.0 - 0.5 K/uL   Basophils Relative 0 %   Basophils Absolute 0.0 0.0 - 0.1 K/uL   Immature Granulocytes 1 %   Abs Immature Granulocytes 0.08 (H) 0.00 - 0.07 K/uL    Comment: Performed at [facility], [address] [place], [place] [address]   DG Knee Complete 4 Views Right  Result Date: 07/27/2023 CLINICAL DATA:  Pain after fall EXAM: RIGHT KNEE - COMPLETE 4 VIEW COMPARISON:  None Available. FINDINGS: Slight joint space loss of the medial compartment. Tiny osteophytes of all 3 compartments. No fracture or dislocation. No joint effusion on lateral view. Extensive vascular calcifications. Severe osteopenia. IMPRESSION: Mild degenerative changes. Greatest of the medial compartment. Severe osteopenia  Electronically Signed   By: Karen Kays M.D.   On: 07/27/2023 08:59   DG Hip Unilat With Pelvis 2-3 Views Right  Result Date: 07/27/2023 CLINICAL DATA:  Pain after fall EXAM: DG HIP (WITH OR WITHOUT PELVIS) 3V RIGHT COMPARISON:  None Available. FINDINGS: Severe osteopenia. Right hip arthroplasty identified with Press-Fit femoral component and screw fixated acetabular cup. Expected alignment.  No obvious hardware failure. Preserved joint spaces elsewhere. Overlapping cardiac leads. Extensive vascular calcifications. No fracture or dislocation. However with this level of severe osteopenia subtle nondisplaced injury is difficult to completely exclude and if needed follow-up cross-sectional imaging as clinically appropriate IMPRESSION: Right hip arthroplasty.  Severe osteopenia. Electronically Signed   By: Karen Kays M.D.   On: 07/27/2023 08:58   DG Chest 1 View  Result Date: 07/27/2023 CLINICAL DATA:  Unwitnessed fall EXAM: CHEST  1 VIEW COMPARISON:  02/25/2022 FINDINGS: S shaped thoracolumbar spine curvature. Midline trachea. Normal heart size. Moderate right hemidiaphragm elevation. No pleural effusion or pneumothorax. Clear lungs. IMPRESSION: No acute cardiopulmonary disease. Electronically Signed   By: Jeronimo Greaves M.D.   On: 07/27/2023 08:56   CT Head Wo Contrast  Result Date: 07/27/2023 CLINICAL DATA:  89 year old male with history of minor head trauma. EXAM: CT HEAD WITHOUT CONTRAST CT CERVICAL SPINE WITHOUT CONTRAST TECHNIQUE: Multidetector CT imaging of the head and cervical spine was performed following the standard protocol without intravenous contrast. Multiplanar CT image reconstructions of the cervical spine were also generated. RADIATION DOSE REDUCTION: This exam was performed according to the departmental dose-optimization program which includes automated exposure control, adjustment of the mA and/or kV according to patient size and/or use of iterative reconstruction technique. COMPARISON:   MRI of the brain 12/12/2012. FINDINGS: CT HEAD FINDINGS Brain: Severe cerebral atrophy with ex vacuo dilatation of the ventricular system. Patchy and confluent areas of decreased attenuation are noted throughout the deep and periventricular white matter of the cerebral hemispheres bilaterally, compatible with chronic microvascular ischemic disease. No evidence of acute infarction, hemorrhage, hydrocephalus, extra-axial collection or mass lesion/mass effect. Vascular: No hyperdense vessel or unexpected calcification. Skull: Normal. Negative for fracture or focal lesion. Sinuses/Orbits: No acute finding. Other: None. CT CERVICAL SPINE FINDINGS Alignment: Mild exaggeration of normal cervical lordosis, likely positional. Alignment is otherwise anatomic. Skull base and vertebrae: No acute fracture. No primary bone lesion or focal pathologic process. Soft tissues and spinal canal: No prevertebral fluid or swelling. No visible canal hematoma. Disc levels: Multilevel degenerative disc disease, most pronounced at C5-C6 and C6-C7. Upper chest: Severe multilevel facet arthropathy bilaterally. Other: Frothy secretions lying dependently in the hypopharynx. IMPRESSION: 1. No evidence of significant acute traumatic injury to the skull or brain. 2. No evidence of significant acute traumatic injury to the cervical spine. 3. Severe cerebral atrophy with chronic microvascular ischemic changes in the cerebral white matter, as above. 4. Multilevel degenerative disc disease and cervical spondylosis, as above. Electronically Signed   By: Trudie Reed M.D.   On: 07/27/2023 08:27   CT Cervical Spine Wo Contrast  Result Date: 07/27/2023 CLINICAL DATA:  89 year old male with history of minor head trauma. EXAM: CT HEAD WITHOUT CONTRAST CT CERVICAL SPINE WITHOUT CONTRAST TECHNIQUE: Multidetector CT imaging of the head and cervical spine was performed following the standard protocol without intravenous contrast. Multiplanar CT image  reconstructions of the cervical spine were also generated. RADIATION DOSE REDUCTION: This exam was performed according to the departmental dose-optimization program which includes automated exposure control, adjustment of the mA and/or kV according to patient size and/or use of iterative reconstruction technique. COMPARISON:  MRI of the brain 12/12/2012. FINDINGS: CT HEAD FINDINGS Brain: Severe cerebral atrophy with ex vacuo dilatation of the ventricular system. Patchy and confluent areas of decreased attenuation are noted throughout the deep and periventricular white matter of the cerebral hemispheres bilaterally, compatible with chronic microvascular ischemic disease. No evidence of acute infarction, hemorrhage, hydrocephalus,  extra-axial collection or mass lesion/mass effect. Vascular: No hyperdense vessel or unexpected calcification. Skull: Normal. Negative for fracture or focal lesion. Sinuses/Orbits: No acute finding. Other: None. CT CERVICAL SPINE FINDINGS Alignment: Mild exaggeration of normal cervical lordosis, likely positional. Alignment is otherwise anatomic. Skull base and vertebrae: No acute fracture. No primary bone lesion or focal pathologic process. Soft tissues and spinal canal: No prevertebral fluid or swelling. No visible canal hematoma. Disc levels: Multilevel degenerative disc disease, most pronounced at C5-C6 and C6-C7. Upper chest: Severe multilevel facet arthropathy bilaterally. Other: Frothy secretions lying dependently in the hypopharynx. IMPRESSION: 1. No evidence of significant acute traumatic injury to the skull or brain. 2. No evidence of significant acute traumatic injury to the cervical spine. 3. Severe cerebral atrophy with chronic microvascular ischemic changes in the cerebral white matter, as above. 4. Multilevel degenerative disc disease and cervical spondylosis, as above. Electronically Signed   By: Trudie Reed M.D.   On: 07/27/2023 08:27    Pending Labs Unresulted Labs  (From admission, onward)     Start     Ordered   07/27/23 0750  Urinalysis, Routine w reflex microscopic -Urine, Clean Catch  Once,   URGENT       Question:  Specimen Source  Answer:  Urine, Clean Catch   07/27/23 0750            Vitals/Pain Today's Vitals   07/27/23 0941 07/27/23 1030 07/27/23 1041 07/27/23 1234  BP:   107/64   Pulse: 99 100 (!) 101   Resp: 17 (!) 23 20   Temp:    97.6 F (36.4 C)  TempSrc:    Axillary  SpO2: 90% 99% 99%     Isolation Precautions No active isolations  Medications Medications  lactated ringers infusion (has no administration in time range)    Mobility walks with device     Focused Assessments    R Recommendations: See Admitting Provider Note  Report given to:   Additional Notes:

## 2023-07-27 NOTE — ED Notes (Signed)
Pt was able to ambulate around w 2 ppl assist.

## 2023-07-27 NOTE — Evaluation (Signed)
Clinical/Bedside Swallow Evaluation Patient Details  Name: [name] MRN: [id number] Date of Birth: [birth year]  Today's Date: 07/27/2023 Time: SLP Start Time (ACUTE ONLY): 1550 SLP Stop Time (ACUTE ONLY): 1609 SLP Time Calculation (min) (ACUTE ONLY): 19 min  Past Medical History:  Past Medical History:  Diagnosis Date   Acute bronchitis 09/05/2009   Qualifier: Diagnosis of  By: Maple Hudson MD, Clinton D    Allergic rhinitis    ALLERGIC RHINITIS 09/26/2007   Qualifier: Diagnosis of  By: Jerolyn Shin     Asthma    Asthma with bronchitis 09/26/2007   Qualifier: Diagnosis of  By: Jerolyn Shin     Barrett syndrome    BPH (benign prostatic hyperplasia)    COPD (chronic obstructive pulmonary disease) (HCC)    Dementia (HCC)    Depressive disorder, not elsewhere classified    Dizziness and giddiness    Esophageal reflux    Hypothyroid    Localization-related (focal) (partial) epilepsy and epileptic syndromes with complex partial seizures, with intractable epilepsy    Memory loss    Other and unspecified hyperlipidemia    Pain in right ankle and joints of right foot 03/03/2017   Post-traumatic osteoarthritis, right ankle and foot 03/03/2017   SEIZURE DISORDER 09/26/2007   Qualifier: Diagnosis of  By: Jerolyn Shin     Seizure disorder [facility])    SVT (supraventricular tachycardia) (HCC) 04/27/2018   Unspecified hypothyroidism    Past Surgical History:  Past Surgical History:  Procedure Laterality Date   APPENDECTOMY     right hip replacement  2005   SVT ABLATION N/A 04/27/2018   Procedure: SVT ABLATION;  Surgeon: Marinus Maw, MD;  Location: [facility] INVASIVE CV LAB;  Service: Cardiovascular;  Laterality: N/A;   HPI:  [name] is an 89 yo male presenting to ED 10/6 from ALF after an unwitnessed fall. W/u negative for acute changes or fxs. PMH includes dementia, hypothyroidism, SVT, seizure disorder    Assessment / Plan / Recommendation  Clinical Impression  Pt pleasantly confused,  although his son was also present to confirm history. They report pt has no prior difficulty swallowing and typically eats a diet of regular textures. He states that pt has had decreased intake and has secretions which cause frequent coughing/throat clearing. Oral motor exam WFL, although note missing dentition in poor condition. Trials of thin liquids resulted in immediate coughing and wet vocal quality. Pt's son reports this is consistent with baseline performance. Trials of purees overall WFL, although required multiple swallows to clear. Recommend he remain NPO except for meds given crushed in purees. Provided education regarding aspiration precautions and potential for completing an instrumental swallow study given pt's current presentation. Will f/u to assess ability to upgrade diet or complete MBS as clinically indicated. SLP Visit Diagnosis: Dysphagia, unspecified (R13.10)    Aspiration Risk  Moderate aspiration risk    Diet Recommendation NPO except meds    Medication Administration: Crushed with puree    Other  Recommendations Oral Care Recommendations: Oral care QID;Staff/trained caregiver to provide oral care    Recommendations for follow up therapy are one component of a multi-disciplinary discharge planning process, led by the attending physician.  Recommendations may be updated based on patient status, additional functional criteria and insurance authorization.  Follow up Recommendations Skilled nursing-short term rehab (<3 hours/day)      Assistance Recommended at Discharge    Functional Status Assessment Patient has had a recent decline in their functional status and demonstrates the ability  to make significant improvements in function in a reasonable and predictable amount of time.  Frequency and Duration min 2x/week  2 weeks       Prognosis Prognosis for improved oropharyngeal function: Good Barriers to Reach Goals: Cognitive deficits;Time post onset      Swallow  Study   General HPI: MATTEW Brown is an 89 yo male presenting to ED 10/6 from ALF after an unwitnessed fall. W/u negative for acute changes or fxs. PMH includes dementia, hypothyroidism, SVT, seizure disorder Type of Study: Bedside Swallow Evaluation Previous Swallow Assessment: none in chart Diet Prior to this Study: NPO Temperature Spikes Noted: No Respiratory Status: Room air History of Recent Intubation: No Behavior/Cognition: Alert;Cooperative;Confused Oral Cavity Assessment: Within Functional Limits Oral Care Completed by SLP: No Oral Cavity - Dentition: Poor condition;Missing dentition Vision: Functional for self-feeding Self-Feeding Abilities: Needs assist Patient Positioning: Upright in bed Baseline Vocal Quality: Wet Volitional Cough: Strong Volitional Swallow: Able to elicit    Oral/Motor/Sensory Function Overall Oral Motor/Sensory Function: Within functional limits   Ice Chips Ice chips: Not tested   Thin Liquid Thin Liquid: Impaired Presentation: Straw;Self Fed Oral Phase Functional Implications: Oral holding Pharyngeal  Phase Impairments: Multiple swallows;Wet Vocal Quality;Cough - Immediate    Nectar Thick Nectar Thick Liquid: Not tested   Honey Thick Honey Thick Liquid: Not tested   Puree Puree: Within functional limits Presentation: Spoon   Solid    Solid: Not tested     Gwynneth Aliment, M.A., CF-SLP Speech Language Pathology, Acute Rehabilitation Services  Secure Chat preferred [phone]  07/27/2023,4:23 PM

## 2023-07-28 ENCOUNTER — Observation Stay (HOSPITAL_COMMUNITY): Payer: Medicare Other

## 2023-07-28 ENCOUNTER — Ambulatory Visit (HOSPITAL_COMMUNITY): Payer: Medicare Other

## 2023-07-28 DIAGNOSIS — Z5320 Procedure and treatment not carried out because of patient's decision for unspecified reasons: Secondary | ICD-10-CM | POA: Diagnosis present

## 2023-07-28 DIAGNOSIS — R9431 Abnormal electrocardiogram [ECG] [EKG]: Secondary | ICD-10-CM | POA: Diagnosis not present

## 2023-07-28 DIAGNOSIS — R32 Unspecified urinary incontinence: Secondary | ICD-10-CM | POA: Diagnosis present

## 2023-07-28 DIAGNOSIS — N400 Enlarged prostate without lower urinary tract symptoms: Secondary | ICD-10-CM | POA: Diagnosis present

## 2023-07-28 DIAGNOSIS — E871 Hypo-osmolality and hyponatremia: Secondary | ICD-10-CM | POA: Diagnosis present

## 2023-07-28 DIAGNOSIS — R569 Unspecified convulsions: Secondary | ICD-10-CM

## 2023-07-28 DIAGNOSIS — E860 Dehydration: Secondary | ICD-10-CM | POA: Diagnosis present

## 2023-07-28 DIAGNOSIS — F32A Depression, unspecified: Secondary | ICD-10-CM | POA: Diagnosis present

## 2023-07-28 DIAGNOSIS — Z515 Encounter for palliative care: Secondary | ICD-10-CM

## 2023-07-28 DIAGNOSIS — S50311A Abrasion of right elbow, initial encounter: Secondary | ICD-10-CM | POA: Diagnosis present

## 2023-07-28 DIAGNOSIS — D72829 Elevated white blood cell count, unspecified: Secondary | ICD-10-CM

## 2023-07-28 DIAGNOSIS — Z681 Body mass index (BMI) 19 or less, adult: Secondary | ICD-10-CM | POA: Diagnosis not present

## 2023-07-28 DIAGNOSIS — Y92121 Bathroom in nursing home as the place of occurrence of the external cause: Secondary | ICD-10-CM | POA: Diagnosis not present

## 2023-07-28 DIAGNOSIS — W19XXXA Unspecified fall, initial encounter: Secondary | ICD-10-CM

## 2023-07-28 DIAGNOSIS — E43 Unspecified severe protein-calorie malnutrition: Secondary | ICD-10-CM | POA: Diagnosis present

## 2023-07-28 DIAGNOSIS — G40209 Localization-related (focal) (partial) symptomatic epilepsy and epileptic syndromes with complex partial seizures, not intractable, without status epilepticus: Secondary | ICD-10-CM | POA: Diagnosis present

## 2023-07-28 DIAGNOSIS — Z66 Do not resuscitate: Secondary | ICD-10-CM | POA: Diagnosis present

## 2023-07-28 DIAGNOSIS — Z7189 Other specified counseling: Secondary | ICD-10-CM | POA: Diagnosis not present

## 2023-07-28 DIAGNOSIS — Z79890 Hormone replacement therapy (postmenopausal): Secondary | ICD-10-CM | POA: Diagnosis not present

## 2023-07-28 DIAGNOSIS — R64 Cachexia: Secondary | ICD-10-CM | POA: Diagnosis present

## 2023-07-28 DIAGNOSIS — R531 Weakness: Secondary | ICD-10-CM

## 2023-07-28 DIAGNOSIS — L98429 Non-pressure chronic ulcer of back with unspecified severity: Secondary | ICD-10-CM | POA: Diagnosis present

## 2023-07-28 DIAGNOSIS — K2270 Barrett's esophagus without dysplasia: Secondary | ICD-10-CM | POA: Diagnosis present

## 2023-07-28 DIAGNOSIS — E785 Hyperlipidemia, unspecified: Secondary | ICD-10-CM | POA: Diagnosis present

## 2023-07-28 DIAGNOSIS — E039 Hypothyroidism, unspecified: Secondary | ICD-10-CM | POA: Diagnosis present

## 2023-07-28 DIAGNOSIS — E870 Hyperosmolality and hypernatremia: Secondary | ICD-10-CM | POA: Diagnosis present

## 2023-07-28 DIAGNOSIS — Z96641 Presence of right artificial hip joint: Secondary | ICD-10-CM | POA: Diagnosis present

## 2023-07-28 DIAGNOSIS — E861 Hypovolemia: Secondary | ICD-10-CM | POA: Diagnosis present

## 2023-07-28 DIAGNOSIS — F0390 Unspecified dementia without behavioral disturbance: Secondary | ICD-10-CM | POA: Diagnosis present

## 2023-07-28 DIAGNOSIS — J4489 Other specified chronic obstructive pulmonary disease: Secondary | ICD-10-CM | POA: Diagnosis present

## 2023-07-28 LAB — BASIC METABOLIC PANEL
Anion gap: 12 (ref 5–15)
Anion gap: 16 — ABNORMAL HIGH (ref 5–15)
Anion gap: 18 — ABNORMAL HIGH (ref 5–15)
BUN: 24 mg/dL — ABNORMAL HIGH (ref 8–23)
BUN: 27 mg/dL — ABNORMAL HIGH (ref 8–23)
BUN: 30 mg/dL — ABNORMAL HIGH (ref 8–23)
CO2: 18 mmol/L — ABNORMAL LOW (ref 22–32)
CO2: 23 mmol/L (ref 22–32)
CO2: 26 mmol/L (ref 22–32)
Calcium: 8.9 mg/dL (ref 8.9–10.3)
Calcium: 8.9 mg/dL (ref 8.9–10.3)
Calcium: 8.9 mg/dL (ref 8.9–10.3)
Chloride: 115 mmol/L — ABNORMAL HIGH (ref 98–111)
Chloride: 116 mmol/L — ABNORMAL HIGH (ref 98–111)
Chloride: 117 mmol/L — ABNORMAL HIGH (ref 98–111)
Creatinine, Ser: 0.82 mg/dL (ref 0.61–1.24)
Creatinine, Ser: 0.84 mg/dL (ref 0.61–1.24)
Creatinine, Ser: 0.85 mg/dL (ref 0.61–1.24)
GFR, Estimated: >60 mL/min (ref >60)
GFR, Estimated: >60 mL/min (ref >60)
GFR, Estimated: >60 mL/min (ref >60)
Glucose, Bld: 88 mg/dL (ref 70–99)
Glucose, Bld: 91 mg/dL (ref 70–99)
Glucose, Bld: 98 mg/dL (ref 70–99)
Potassium: 3.9 mmol/L (ref 3.5–5.1)
Potassium: 4 mmol/L (ref 3.5–5.1)
Potassium: 4.1 mmol/L (ref 3.5–5.1)
Sodium: 153 mmol/L — ABNORMAL HIGH (ref 135–145)
Sodium: 154 mmol/L — ABNORMAL HIGH (ref 135–145)
Sodium: 154 mmol/L — ABNORMAL HIGH (ref 135–145)

## 2023-07-28 LAB — BLOOD GAS, VENOUS
Acid-Base Excess: 1.7 mmol/L (ref 0.0–2.0)
Bicarbonate: 27.2 mmol/L (ref 20.0–28.0)
O2 Saturation: 51.1 %
Patient temperature: 37.1
pCO2, Ven: 45 mm[Hg] (ref 44–60)
pH, Ven: 7.39 (ref 7.25–7.43)
pO2, Ven: 34 mm[Hg] (ref 32–45)

## 2023-07-28 LAB — CBC
HCT: 43.8 % (ref 39.0–52.0)
Hemoglobin: 12.9 g/dL — ABNORMAL LOW (ref 13.0–17.0)
MCH: 27.1 pg (ref 26.0–34.0)
MCHC: 29.5 g/dL — ABNORMAL LOW (ref 30.0–36.0)
MCV: 92 fL (ref 80.0–100.0)
Platelets: 237 10*3/uL (ref 150–400)
RBC: 4.76 MIL/uL (ref 4.22–5.81)
RDW: 14.8 % (ref 11.5–15.5)
WBC: 12.3 10*3/uL — ABNORMAL HIGH (ref 4.0–10.5)
nRBC: 0 % (ref 0.0–0.2)

## 2023-07-28 LAB — LACTIC ACID, PLASMA
Lactic Acid, Venous: 2.5 mmol/L (ref 0.5–1.9)
Lactic Acid, Venous: 2 mmol/L (ref 0.5–1.9)

## 2023-07-28 LAB — MAGNESIUM: Magnesium: 2.4 mg/dL (ref 1.7–2.4)

## 2023-07-28 LAB — PHOSPHORUS: Phosphorus: 3.5 mg/dL (ref 2.5–4.6)

## 2023-07-28 LAB — CK: Total CK: 74 U/L (ref 49–397)

## 2023-07-28 MED ORDER — LEVETIRACETAM IN NACL 500 MG/100ML IV SOLN
500.0000 mg | Freq: Every day | INTRAVENOUS | Status: DC
  Administered 2023-07-28 – 2023-07-29 (×2): 500 mg via INTRAVENOUS
  Filled 2023-07-28 (×3): qty 100

## 2023-07-28 MED ORDER — SODIUM CHLORIDE 0.9 % IV SOLN
250.0000 mg | Freq: Every day | INTRAVENOUS | Status: DC
  Administered 2023-07-28 – 2023-07-30 (×3): 250 mg via INTRAVENOUS
  Filled 2023-07-28 (×3): qty 2.5

## 2023-07-28 MED ORDER — SODIUM CHLORIDE 0.9 % IV BOLUS: 500.0000 mL | Freq: Once | INTRAVENOUS | Status: DC

## 2023-07-28 MED ORDER — ENOXAPARIN SODIUM 40 MG/0.4ML IJ SOSY
40.0000 mg | PREFILLED_SYRINGE | INTRAMUSCULAR | Status: DC
  Administered 2023-07-29: 40 mg via SUBCUTANEOUS
  Filled 2023-07-28 (×2): qty 0.4

## 2023-07-28 MED ORDER — LACTATED RINGERS IV BOLUS
500.0000 mL | Freq: Once | INTRAVENOUS | Status: AC
  Administered 2023-07-28: 500 mL via INTRAVENOUS

## 2023-07-28 MED ORDER — LACTATED RINGERS IV SOLN: INTRAVENOUS | Status: AC

## 2023-07-28 NOTE — Evaluation (Signed)
Physical Therapy Evaluation Patient Details Name: [name] MRN: [id number] DOB: [birth year] Today's Date: 07/28/2023  History of Present Illness  [name] is a 89 y.o. male who presented to the [facility] ED 10/6 after an unwitnessed fall in his bathroom. past medical history of progressive dementia and poor p.o. intake living at ALF, 30 pound weight loss since January, right hip replacement, longstanding right foot/ankle injury, osteoarthritis, hypothyroidism, and SVT status post ablation.  Clinical Impression  Patient received in bed, he is asking for glass ( to urinate). Requires multiple multimodal cues to use male pure wick. He is limited by cognition. Patient reports general pain during mobility. He requires mod A for bed mobility and sit to stand at edge of bed. He is limited by endurance, pain and weakness. Patient unable to achieve full upright standing and only tolerated for a couple of seconds before sitting back down. Patient will benefit from continued skilled PT to improve strength and independence as able.         If plan is discharge home, recommend the following: A lot of help with walking and/or transfers;A lot of help with bathing/dressing/bathroom;Assistance with feeding;Assistance with cooking/housework;Direct supervision/assist for financial management;Assist for transportation;Help with stairs or ramp for entrance;Direct supervision/assist for medications management   Can travel by private vehicle   No    Equipment Recommendations None recommended by PT  Recommendations for Other Services       Functional Status Assessment Patient has had a recent decline in their functional status and/or demonstrates limited ability to make significant improvements in function in a reasonable and predictable amount of time     Precautions / Restrictions Precautions Precautions: Fall Restrictions Weight Bearing Restrictions: No      Mobility  Bed Mobility Overal bed  mobility: Needs Assistance Bed Mobility: Supine to Sit, Sit to Supine     Supine to sit: Mod assist Sit to supine: Mod assist, HOB elevated        Transfers Overall transfer level: Needs assistance Equipment used: 2 person hand held assist Transfers: Sit to/from Stand Sit to Stand: Mod assist           General transfer comment: patient unable to get fully upright in standing. Tolerated only a few seconds and returned self back to sitting.    Ambulation/Gait               General Gait Details: unable  Stairs            Wheelchair Mobility     Tilt Bed    Modified Rankin (Stroke Patients Only)       Balance Overall balance assessment: Needs assistance Sitting-balance support: Feet supported Sitting balance-Leahy Scale: Fair Sitting balance - Comments: very kyphotic in sitting edge of bed   Standing balance support: Bilateral upper extremity supported, During functional activity, Reliant on assistive device for balance Standing balance-Leahy Scale: Poor                               Pertinent Vitals/Pain Pain Assessment Pain Assessment: Faces Faces Pain Scale: Hurts little more Pain Location: generalized with mobility Pain Descriptors / Indicators: Grimacing, Guarding Pain Intervention(s): Monitored during session, Repositioned    Home Living Family/patient expects to be discharged to:: Hospice/Palliative care                 Home Equipment: Agricultural consultant (2 wheels)      Prior  Function Prior Level of Function : Needs assist             Mobility Comments: patient living in ALF, however per notes, he required assistance and spend a lot of time in bed, not eating or drinking much lately ADLs Comments: needs assistance     Extremity/Trunk Assessment   Upper Extremity Assessment Upper Extremity Assessment: Defer to OT evaluation    Lower Extremity Assessment Lower Extremity Assessment: Generalized weakness     Cervical / Trunk Assessment Cervical / Trunk Assessment: Kyphotic  Communication   Communication Communication: Hearing impairment Cueing Techniques: Verbal cues;Gestural cues  Cognition Arousal: Alert Behavior During Therapy: Agitated Overall Cognitive Status: History of cognitive impairments - at baseline                                          General Comments      Exercises     Assessment/Plan    PT Assessment Patient needs continued PT services  PT Problem List Decreased strength;Decreased activity tolerance;Decreased balance;Decreased mobility;Decreased cognition;Decreased safety awareness;Decreased knowledge of precautions;Pain;Decreased skin integrity;Decreased range of motion       PT Treatment Interventions DME instruction;Gait training;Functional mobility training;Therapeutic activities;Therapeutic exercise;Balance training;Neuromuscular re-education;Cognitive remediation;Patient/family education    PT Goals (Current goals can be found in the Care Plan section)  Acute Rehab PT Goals Patient Stated Goal: none stated PT Goal Formulation: Patient unable to participate in goal setting Time For Goal Achievement: 08/11/23    Frequency Min 1X/week     Co-evaluation               AM-PAC PT "6 Clicks" Mobility  Outcome Measure Help needed turning from your back to your side while in a flat bed without using bedrails?: A Little Help needed moving from lying on your back to sitting on the side of a flat bed without using bedrails?: A Lot Help needed moving to and from a bed to a chair (including a wheelchair)?: Total Help needed standing up from a chair using your arms (e.g., wheelchair or bedside chair)?: A Lot Help needed to walk in hospital room?: Total Help needed climbing 3-5 steps with a railing? : Total 6 Click Score: 10    End of Session   Activity Tolerance: Patient limited by fatigue;Patient limited by pain Patient left: in  bed;with call bell/phone within reach;with bed alarm set Nurse Communication: Mobility status PT Visit Diagnosis: History of falling (Z91.81);Unsteadiness on feet (R26.81);Other abnormalities of gait and mobility (R26.89);Muscle weakness (generalized) (M62.81);Difficulty in walking, not elsewhere classified (R26.2);Adult, failure to thrive (R62.7)    Time: 1100-1110 PT Time Calculation (min) (ACUTE ONLY): 10 min   Charges:   PT Evaluation $PT Eval Moderate Complexity: 1 Mod   PT General Charges $$ ACUTE PT VISIT: 1 Visit         Micheale Schlack, PT, GCS 07/28/23,11:31 AM

## 2023-07-28 NOTE — Evaluation (Signed)
Occupational Therapy Evaluation Patient Details Name: [name] MRN: [id number] DOB: 05-Jun-1934 Today's Date: 07/28/2023   History of Present Illness [name] is a 89 y.o. male who presented to the [facility] ED 10/6 after an unwitnessed fall in his bathroom. past medical history of progressive dementia and poor p.o. intake living at ALF, 30 pound weight loss since January, right hip replacement, longstanding right foot/ankle injury, osteoarthritis, hypothyroidism, and SVT status post ablation.   Clinical Impression   Prior to this admission, patient at ALF, sleeping the majority of the time, requiring assist for all ADLs, and decreased appetite and intake (per chart). Currently, patient presenting with generalized weakness, failure to thrive, decreased cognition and participation. Patient with need for mod A for bed mobility, and increased HHA in order to come into standing, though refusing to progress further. Patient is currently max A for ADL management. OT recommending rehab at lesser intensive setting prior to returning to ALF, however would greatly welcome palliative input to define best plan of care. OT will continue to follow.      If plan is discharge home, recommend the following: A lot of help with walking and/or transfers;A lot of help with bathing/dressing/bathroom;Direct supervision/assist for medications management;Direct supervision/assist for financial management;Assist for transportation;Help with stairs or ramp for entrance;Supervision due to cognitive status;Assistance with cooking/housework;Assistance with feeding    Functional Status Assessment  Patient has had a recent decline in their functional status and demonstrates the ability to make significant improvements in function in a reasonable and predictable amount of time.  Equipment Recommendations  Other (comment) (defer to next venue)    Recommendations for Other Services Other (comment) (Palliative  consult)     Precautions / Restrictions Precautions Precautions: Fall Restrictions Weight Bearing Restrictions: No      Mobility Bed Mobility Overal bed mobility: Needs Assistance Bed Mobility: Supine to Sit, Sit to Supine     Supine to sit: Mod assist Sit to supine: Mod assist, HOB elevated   General bed mobility comments: needing assist to advance RLE due to pain    Transfers Overall transfer level: Needs assistance Equipment used: 2 person hand held assist Transfers: Sit to/from Stand Sit to Stand: Mod assist           General transfer comment: patient unable to get fully upright in standing. Tolerated only a few seconds and returned self back to sitting.      Balance Overall balance assessment: Needs assistance Sitting-balance support: Feet supported Sitting balance-Leahy Scale: Fair Sitting balance - Comments: very kyphotic in sitting edge of bed   Standing balance support: Bilateral upper extremity supported, During functional activity, Reliant on assistive device for balance Standing balance-Leahy Scale: Poor                             ADL either performed or assessed with clinical judgement   ADL Overall ADL's : Needs assistance/impaired Eating/Feeding: NPO   Grooming: Maximal assistance;Bed level   Upper Body Bathing: Maximal assistance;Bed level   Lower Body Bathing: Maximal assistance;Total assistance;Bed level   Upper Body Dressing : Maximal assistance;Sitting   Lower Body Dressing: Total assistance;Maximal assistance;Sitting/lateral leans;Sit to/from stand   Toilet Transfer: Moderate assistance;+2 for physical assistance;+2 for safety/equipment Toilet Transfer Details (indicate cue type and reason): simulated Toileting- Clothing Manipulation and Hygiene: Total assistance;Bed level       Functional mobility during ADLs: Maximal assistance;Cueing for sequencing;Cueing for safety General ADL Comments: Patient  presenting with  generalized weakness, failure to thrive, decreased cognition and participation. Patient with need for mod A for bed mobility, and increased HHA in order to come into standing, though refusing to progress further. Patient is currently max A for ADL management. OT recommending rehab at lesser intensive setting prior to returning to ALF, however would greatly welcome palliative input to define best plan of care. OT will continue to follow.     Vision Patient Visual Report: No change from baseline Additional Comments: unsure of status due to decreased participation     Perception Perception: Not tested       Praxis Praxis: Not tested       Pertinent Vitals/Pain Pain Assessment Pain Assessment: Faces Faces Pain Scale: Hurts little more Pain Location: generalized with mobility Pain Descriptors / Indicators: Grimacing, Guarding Pain Intervention(s): Limited activity within patient's tolerance, Monitored during session, Repositioned     Extremity/Trunk Assessment Upper Extremity Assessment Upper Extremity Assessment: Generalized weakness   Lower Extremity Assessment Lower Extremity Assessment: Defer to PT evaluation;Generalized weakness   Cervical / Trunk Assessment Cervical / Trunk Assessment: Kyphotic   Communication Communication Communication: Hearing impairment Cueing Techniques: Verbal cues;Gestural cues;Tactile cues;Visual cues   Cognition Arousal: Lethargic Behavior During Therapy: Agitated Overall Cognitive Status: History of cognitive impairments - at baseline                                 General Comments: Patient with baseline dementia, disoriented, confused, and minimally agitated     General Comments  Skin tear noted on back near spinous process on R    Exercises     Shoulder Instructions      Home Living Family/patient expects to be discharged to:: Skilled nursing facility                             Home Equipment: Rolling  Walker (2 wheels)          Prior Functioning/Environment Prior Level of Function : Needs assist             Mobility Comments: patient living in ALF, however per notes, he required assistance and spend a lot of time in bed, not eating or drinking much lately ADLs Comments: needs assistance, incontinent        OT Problem List: Decreased strength;Decreased range of motion;Decreased activity tolerance;Impaired balance (sitting and/or standing);Decreased coordination;Decreased cognition;Decreased safety awareness;Decreased knowledge of use of DME or AE;Pain      OT Treatment/Interventions: Self-care/ADL training;Therapeutic exercise;Energy conservation;DME and/or AE instruction;Manual therapy;Cognitive remediation/compensation;Patient/family education;Balance training;Therapeutic activities    OT Goals(Current goals can be found in the care plan section) Acute Rehab OT Goals Patient Stated Goal: unable OT Goal Formulation: Patient unable to participate in goal setting Time For Goal Achievement: 08/11/23 Potential to Achieve Goals: Poor  OT Frequency: Min 1X/week    Co-evaluation              AM-PAC OT "6 Clicks" Daily Activity     Outcome Measure Help from another person eating meals?: Total (NPO) Help from another person taking care of personal grooming?: A Lot Help from another person toileting, which includes using toliet, bedpan, or urinal?: A Lot Help from another person bathing (including washing, rinsing, drying)?: A Lot Help from another person to put on and taking off regular upper body clothing?: A Lot Help from another person to put on and  taking off regular lower body clothing?: A Lot 6 Click Score: 11   End of Session Nurse Communication: Mobility status  Activity Tolerance: Patient limited by fatigue;Patient limited by lethargy;Patient limited by pain Patient left: in bed;with call bell/phone within reach;with bed alarm set  OT Visit Diagnosis:  Unsteadiness on feet (R26.81);Other abnormalities of gait and mobility (R26.89);Muscle weakness (generalized) (M62.81);Other symptoms and signs involving cognitive function;Adult, failure to thrive (R62.7);Pain Pain - Right/Left: Right Pain - part of body: Hip                Time: 1101-1109 OT Time Calculation (min): 8 min Charges:  OT General Charges $OT Visit: 1 Visit OT Evaluation $OT Eval Low Complexity: 1 Low  Pollyann Glen E. Arafat Cocuzza, OTR/L Acute Rehabilitation Services [phone]   Cherlyn Cushing 07/28/2023, 3:09 PM

## 2023-07-28 NOTE — Progress Notes (Signed)
EEG complete - results pending 

## 2023-07-28 NOTE — Evaluation (Signed)
Modified Barium Swallow Study  Patient Details  Name: [name] MRN: [id number] Date of Birth: December 16, 1933  Today's Date: 07/28/2023  Modified Barium Swallow completed.  Full report located under Chart Review in the Imaging Section.  History of Present Illness [name] is an 89 yo male presenting to ED 10/6 from ALF after an unwitnessed fall. W/u negative for acute changes or fxs. PMH includes dementia, Barrett's esophagus (per son's report), hypothyroidism, SVT, seizure disorder   Clinical Impression Pt has a significant oropharyngeal dysphagia. There is a thick appearance to his epiglottis vs fullness of his valleculae at baseline. His posterior lingual transit is repetitive and disorganized. Pharyngeal he has significantly limited base of tongue retraction, hyolaryngeal movemnt, and pharyngeal squeeze. There is reduced laryngeal vestibule closure and epiglottic deflection as well. He does not achieve full airway protection although seems to mostly penetrate during this study. Penetration occurs with liquids and purees, reaching as far as to his vocal folds, which triggers throat clearing that does not effectively clear his airway. Pt's son says that he has been clearing his throat like this for a very long time. We discussed that coughing noted clinically could potentially be concerning for deeper entrance into the airway (aspiration). In addition, pt has no pharyngeal clearance initially with small bites of puree. The whole bolus stays in his valleculae and on his base of tongue. When cued to perform second swallows or use a liquid wash, minimal amounts of residue can be cleared from his valleculae. Education was provided with son present regarding concern for chronic elements of his dysphagia. Although there is the potential for some acute deconditioning, I think therapy options would likely be limited given mentation/participation level, and ultimately there would still be a chronic  underlying dysphagia. Pt may be appropriate for comfort feeds if inline with overall GOC. MD, palliative care, and nurse all updated as well especially given concern for ability to take meds crushed in puree - may want to consider alternative options.  Factors that may increase risk of adverse event in presence of aspiration [name] & Clearance [name] 2021): Poor general health and/or compromised immunity;Respiratory or GI disease;Reduced cognitive function;Frail or deconditioned;Weak cough  Swallow Evaluation Recommendations Recommendations: NPO Medication Administration: Via alternative means      Mahala Menghini., M.A. CCC-SLP Acute Rehabilitation Services Office [phone]  Secure chat preferred  07/28/2023,1:57 PM

## 2023-07-28 NOTE — Care Management Obs Status (Signed)
MEDICARE OBSERVATION STATUS NOTIFICATION   Patient Details  Name: [name] MRN: [id number] Date of Birth: Jan 26, 1934   Medicare Observation Status Notification Given:  Yes    Ronny Bacon, RN 07/28/2023, 9:50 AM

## 2023-07-28 NOTE — Assessment & Plan Note (Addendum)
153 ? 154.  Over the last 24 hours, given IV LR 500 mg bolus alongside 1.2 L of IV LR continuous.  Calculated free water deficit of 2.7 L. - Begin BMPs every 6 hours to monitor sodium levels, adjust IV repletion as appropriate. - We will continue IV LR and switch to D5W after repeat BMP afternoon of 10/7. - Monitor I/Os

## 2023-07-28 NOTE — Progress Notes (Addendum)
Speech Language Pathology Treatment: Dysphagia  Patient Details Name: [name] MRN: [id number] DOB: Aug 07, 1934 Today's Date: 07/28/2023 Time: 4098-1191 SLP Time Calculation (min) (ACUTE ONLY): 20 min  Assessment / Plan / Recommendation Clinical Impression  Pt was seen this morning, but agreeable to very little intake. He refused ice chips and took a few sips of water, which were all followed by multiple subswallows and immediate, sometimes prolonged coughing. Difficulty was not as overt with bites of puree, but he would only take two bites that had his medication in them. He still had multiple swallows but coughing was more delayed.   Discussed concerns with son, including s/sx of possible dysphagia and aspiration. It also seems like he has had difficulty PTA (had clinical SLP evaluation but per son, was told that it was inconclusive), has had less interest in POs overall, and has been losing weight. Son is interested in [facility] to see if there is an underlying dysphagia contributing as he acknowledges that they need to determine overall POC. We did discuss concern about his participation in testing, and that he would need to take in POs. He would like to try to do this at a time later in the day when pt is most alert, and son will try to come to optimize testing as much as possible.   Will plan for an MBS in the afternoon as can be scheduled with radiology. In the meantime, would focus on oral care and meds crushed in puree.    HPI HPI: [name] is an 89 yo male presenting to ED 10/6 from ALF after an unwitnessed fall. W/u negative for acute changes or fxs. PMH includes dementia, hypothyroidism, SVT, seizure disorder      SLP Plan  MBS      Recommendations for follow up therapy are one component of a multi-disciplinary discharge planning process, led by the attending physician.  Recommendations may be updated based on patient status, additional functional criteria and insurance  authorization.    Recommendations  Diet recommendations: NPO Medication Administration: Crushed with puree                  Oral care QID;Staff/trained caregiver to provide oral care   Frequent or constant Supervision/Assistance Dysphagia, unspecified (R13.10)     MBS     Mahala Menghini., M.A. CCC-SLP Acute Rehabilitation Services Office [phone]  Secure chat preferred   07/28/2023, 11:05 AM

## 2023-07-28 NOTE — Assessment & Plan Note (Addendum)
Imaging negative for acute fracture/head injury.  Likeliest etiology: Orthostasis in the setting of hypervolemia. Other conditions which could have contributed are undergoing workup: infection, seizure. - Provided nursing home [facility] assisted living) with Hospital fax number, awaiting labs/notes. - Ordered delirium precautions. - Continue to await UA/CBC, both ordered.   - CK ordered, do not have a clear timeline as to how long he was down. - Awaiting EEG read, patient has history of complex partial seizure. - PT/OT eval and treat

## 2023-07-28 NOTE — Procedures (Signed)
Patient Name: [name]  MRN: [id number]  Epilepsy Attending: Charlsie Quest  Referring Physician/Provider: Tomie China, MD  Date: 10//2024 Duration: 23.37 mins  Patient history: 89yo M with h/o seizure now with ams getting eeg to evaluate for seizure  Level of alertness: Awake, asleep  AEDs during EEG study: LEV  Technical aspects: This EEG study was done with scalp electrodes positioned according to the 10-20 International system of electrode placement. Electrical activity was reviewed with band pass filter of 1-70Hz , sensitivity of 7 uV/mm, display speed of 66mm/sec with a 60Hz  notched filter applied as appropriate. EEG data were recorded continuously and digitally stored.  Video monitoring was available and reviewed as appropriate.  Description: The posterior dominant rhythm consists of 7 Hz activity of moderate voltage (25-35 uV) seen predominantly in posterior head regions, symmetric and reactive to eye opening and eye closing. Sleep was characterized by vertex waves, sleep spindles (12 to 14 Hz), maximal frontocentral region. EEG showed continuous generalized 3 to 6 Hz theta-delta slowing. Hyperventilation and photic stimulation were not performed.     ABNORMALITY - Continuous slow, generalized  IMPRESSION: This study is suggestive of mild diffuse encephalopathy. No seizures or epileptiform discharges were seen throughout the recording.  Please note lack of epileptiform activity during interictal EEG does not exclude the diagnosis of epilepsy.  Alayne Estrella Annabelle Harman

## 2023-07-28 NOTE — Progress Notes (Signed)
OT Cancellation Note  Patient Details Name: [name] MRN: [id number] DOB: 29-Jun-1934   Cancelled Treatment:    Reason Eval/Treat Not Completed: Patient declined, no reason specified Patient refusing to open eyes for OT, yelling at OT "Im sleeping". Patient agreeable to having OT return at later time to complete evaluation. OT will continue to follow.  Pollyann Glen E. Asencion Loveday, OTR/L Acute Rehabilitation Services [phone]   Cherlyn Cushing 07/28/2023, 10:52 AM

## 2023-07-28 NOTE — Consult Note (Signed)
Palliative Care Consult Note                                  Date: 07/28/2023   Patient Name: [name]  DOB: July 03, 1934  MRN: [id number]  Age / Sex: 89 y.o., male  PCP: Nadara Eaton, MD Referring Physician: Caro Laroche, DO  Reason for Consultation: Establishing goals of care  HPI/Patient Profile: 89 y.o. male  with past medical history of progressive dementia and acutely worsening p.o. intake complicated by 30 pound weight loss since January, history of complex partial seizure well-controlled on Keppra, right foot/ankle injury, SVT status post ablation who presented to [facility] ED after a fall in setting of likely dehydration and orthostasis.  Found to have no acute fractures/injury, however patient is profoundly cachectic and weak with hypernatremia to 153.  Patient admitted for fall workup, fluid repletion, and diet modification.  Palliative medicine was consulted for GOC conversations.  Past Medical History:  Diagnosis Date   Acute bronchitis 09/05/2009   Qualifier: Diagnosis of  By: Maple Hudson MD, Clinton D    Allergic rhinitis    ALLERGIC RHINITIS 09/26/2007   Qualifier: Diagnosis of  By: Jerolyn Shin     Asthma    Asthma with bronchitis 09/26/2007   Qualifier: Diagnosis of  By: Jerolyn Shin     Barrett syndrome    BPH (benign prostatic hyperplasia)    COPD (chronic obstructive pulmonary disease) (HCC)    Dementia (HCC)    Depressive disorder, not elsewhere classified    Dizziness and giddiness    Esophageal reflux    Hypothyroid    Localization-related (focal) (partial) epilepsy and epileptic syndromes with complex partial seizures, with intractable epilepsy    Memory loss    Other and unspecified hyperlipidemia    Pain in right ankle and joints of right foot 03/03/2017   Post-traumatic osteoarthritis, right ankle and foot 03/03/2017   SEIZURE DISORDER 09/26/2007   Qualifier: Diagnosis of  By: Jerolyn Shin      Seizure disorder [facility])    SVT (supraventricular tachycardia) (HCC) 04/27/2018   Unspecified hypothyroidism     Subjective:   This NP Wynne Dust reviewed medical records, received report from team, assessed the patient and then meet at the patient's bedside to discuss diagnosis, prognosis, GOC, EOL wishes disposition and options.  I met with the patient at the bedside, although he was sleeping and I elected not to wake him.  Present at the bedside with the patient's son [name].  We excused ourselves to the conference room for further discussions.   We meet to discuss diagnosis prognosis, GOC, EOL wishes, disposition and options. Concept of Palliative Care was introduced as specialized medical care for people and their families living with serious illness.  If focuses on providing relief from the symptoms and stress of a serious illness.  The goal is to improve quality of life for both the patient and the family. Values and goals of care important to patient and family were attempted to be elicited.  Created space and opportunity for patient  and family to explore thoughts and feelings regarding current medical situation   Natural trajectory and current clinical status were discussed. Questions and concerns addressed. Patient  encouraged to call with questions or concerns.    Patient/Family Understanding of Illness: He understands his dad has dementia and has for several years.  He currently lives at [facility] assisted  living facility and has generally done well there.  However, last month it started about 2 days a week where he would not want to get up and would stay in bed and sleep until about 3 PM.  Sometimes on the states he would eat more.  He notes that these to be have become more frequent and are now closer to 4 5 days a week.  Overall he is sleeping much more and on these days where he sleeps late he generally will only be awake for 4 hours a day.  Even on days where he would get up he would  need encouragement to get up from 9:30 in the morning when he would read, eats lunch (though admittedly not much), eat a couple snacks in the afternoon, take a nap, eat dinner, and go to bed.  Life Review: The patient's wife passed away a couple years ago.  He has 2 sons, he was born in [place].  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  He he is not a big music person but when he does listen he generally enjoys classical music  Goals: To be determined  Today's Discussion: In addition to discussions described above we had extensive discussion of various topics.  We had a very in-depth discussion about dementia and the progression of dementia.  We discussed common things as dementia progresses including sleeping more, increased weakness, worsening memory and interaction, decreased appetite.  [name] shares that he understands that dementia is progressive and irreversible.  However, he is "done this before" where he does not eat much and gets very weak admitted to the hospital and then will improve with fluids and treatment and bounce back.  We discussed how repeated acute insults often result in the patient not returning to true baseline and represents an overall decline.  He verbalized understanding.  We discussed CODE STATUS.  I explained that while we do not have a crystal ball, the patient is in a situation similar to his father, resuscitation is often ineffective and hurtful.  We discussed that if his father has cardiac arrest it would be because of a multitude of chronic illnesses that have gotten worse, especially his progressive dementia.  CPR is unlikely to fix this.  I encouraged him to speak with his brother about considering a DNR status.  He states he more or less agrees with this and he has been talking with his brother already, but cannot make a decision today.  We also discussed that his poor appetite is likely his body winding down and simply not being  hungry.  He expressed understanding of this.  We discussed that in this situation forcing food into somebody can be uncomfortable and resultant unintended consequences such as nausea and vomiting.  We discussed that feeding tubes are sometimes felt to be a "fixable" but in this situation they are generally not a productive idea.  He thinks that he and his brother are probably on the same page about this but they would like to continue discussing more.  Also, a modified barium swallow is scheduled for today and I would like data on this.  Overall he feels that he would like to make sure there is not something "treatable" that could result in a rebound, even if not back to baseline.  However, if there is nothing to fix that he understands that this could be possibly a situation to consider hospice.  Since it was brought up I described hospice as a  service for patients who have a life expectancy of 6 months or less. The goal of hospice is the preservation of dignity and quality at the end phases of life. Under hospice care, the focus changes from curative to symptom relief.  He verbalized understanding.  We agreed to allow time for outcomes, especially today.  I informed him I would come back tomorrow and we can chat some more.  He agreed.  I provided emotional and general support through therapeutic listening, empathy, sharing of stories, and other techniques. I answered all questions and addressed all concerns to the best of my ability.  Review of Systems  Unable to perform ROS: Acuity of condition    Objective:   Primary Diagnoses: Present on Admission:  Fall  Leukocytosis   Physical Exam Vitals and nursing note reviewed.  Constitutional:      General: He is sleeping. He is not in acute distress.    Appearance: He is cachectic. He is ill-appearing. He is not toxic-appearing.  Pulmonary:     Effort: Pulmonary effort is normal. No respiratory distress.  Abdominal:     General: Abdomen is  flat.     Vital Signs:  BP 90/65 (BP Location: Left Arm)   Pulse 93   Temp (!) 97.2 F (36.2 C)   Resp 16   Ht 5\' 9"  (1.753 m)   Wt 63.5 kg   SpO2 94%   BMI 20.67 kg/m   Palliative Assessment/Data: 50-60%    Advanced Care Planning:   Existing Vynca/ACP Documentation: None  Primary Decision Maker: NEXT OF KIN (I do not think the patient likely has capacity; this will need to be determined for sure when I can actually speak with him and may evolve over time)  Code Status/Advance Care Planning: Full code  A discussion was had today regarding advanced directives. Concepts specific to code status, artifical feeding and hydration, continued IV antibiotics and rehospitalization was had.  The difference between a aggressive medical intervention path and a palliative comfort care path for this patient at this time was had.   Decisions/Changes to ACP: None today  Assessment & Plan:   Impression: 89 year old male with chronic comorbidities and acute presentations as described above.  He seems to be has been having a more rapid progression of his dementia.  It appears he has not had difficulty with oral intake for about the past many months to year, this is worsened over the past several weeks.  Patient's family states that he has done this before and seems to bounce back.  However, they understand at some point he will not bounce back.  After extensive discussion on options and disease progression and prognosis, they would like time for continued workup today especially modified barium swallow to see if there is anything "fixable" about his difficulty eating.  We agreed to meet tomorrow and further discuss goals of care.  Overall long-term prognosis poor  SUMMARY OF RECOMMENDATIONS   Remain full code for now Continue full scope of care for now Encourage discussion amongst sons about CODE STATUS and goals of care Await results of SLP evaluation PMT will follow-up tomorrow for  further discussion  Symptom Management:  Per primary team PMT is available to assist as needed  Prognosis:  < 6 months  Discharge Planning:  To Be Determined   Discussed with: Patient's family, medical team, nursing team, speech therapy    Thank you for allowing [place] to participate in the care of OBDULIO MASH PMT will continue  to support holistically.  Time Total: 75 min ACP Time: 20 min  Detailed review of medical records (labs, imaging, vital signs), medically appropriate exam, discussed with treatment team, counseling and education to patient, family, & staff, documenting clinical information, medication management, coordination of care  Signed by: Wynne Dust, NP Palliative Medicine Team  Team Phone # [phone] (Nights/Weekends)  07/28/2023, 11:38 AM

## 2023-07-28 NOTE — Assessment & Plan Note (Addendum)
QTc 577 ms ? corrected to 419.  QRS duration: 99 ms.  - Patient on telemetry, continue to observe for abnormal heart rhythms. - Avoid QTc prolonging medications.

## 2023-07-28 NOTE — Assessment & Plan Note (Addendum)
Patient is very cachectic and ill-appearing.  Poor p.o. intake for at least 10 months if not longer.  On mirtazapine and Ensure at ALF: per son, he has "not felt like eating."  SLP recommended NPO except for meds crushed in pures. Held p.o. meds overnight, as patient had trouble with thin liquids/pured meds at bedside.  SLP evaluation confirms that patient has severe dysphagia -- barium swallow study showed significant difficulty clearing bites of pure.  Limited role for therapy in the setting.  Education provided to son, [name]. Palliative care has conducted initial conversation with family today.  Later in afternoon, both sons are present and requested that nursing feed him.  Team is okay with comfort feeds as long as family is aware that 1) this will not likely alter his course, and 2) aspiration followed by acute decompensation is very possible. Family informed and agreed to applesauce, pudding, water. - Palliative care follow-up 10/8 to discuss GOC further.  Very appreciative of their work and SLPs work thus far in leading this conversation. - RD consult placed - Continue mirtazapine 30 daily for appetite stimulation. - Consider refeeding precautions.

## 2023-07-28 NOTE — Progress Notes (Signed)
Daily Progress Note Intern Pager: [phone]  Patient name: [name] Medical record number: [id number] Date of birth: Nov 03, 1933 Age: 89 y.o. Gender: male  Primary Care Provider: Nadara Eaton, MD Consultants: Palliative care Code Status: Full  Pt Overview and Major Events to Date:   [name] is an 89 year old male with a history of progressive dementia and acutely worsening p.o. intake complicated by 30 pound weight loss since January, history of complex partial seizure well-controlled on Keppra, right foot/ankle injury, SVT status post ablation who presented to [facility] ED after a fall in setting of likely dehydration and orthostasis.  Found to have no acute fractures/injury, however patient is profoundly cachectic and weak with hypernatremia to 153.  Patient admitted for fall workup, fluid repletion, and diet modification. Assessment & Plan Protein-calorie malnutrition [facility]) Patient is very cachectic and ill-appearing.  Poor p.o. intake for at least 10 months if not longer.  On mirtazapine and Ensure at ALF: per son, he has "not felt like eating."  SLP recommended NPO except for meds crushed in pures. Held p.o. meds overnight, as patient had trouble with thin liquids/pured meds at bedside.  SLP evaluation confirms that patient has severe dysphagia -- barium swallow study showed significant difficulty clearing bites of pure.  Limited role for therapy in the setting.  Education provided to son, [name]. Palliative care has conducted initial conversation with family today.  Later in afternoon, both sons are present and requested that nursing feed him.  Team is okay with comfort feeds as long as family is aware that 1) this will not likely alter his course, and 2) aspiration followed by acute decompensation is very possible. Family informed and agreed to applesauce, pudding, water. - Palliative care follow-up 10/8 to discuss GOC further.  Very appreciative of their work and SLPs work thus  far in leading this conversation. - RD consult placed - Continue mirtazapine 30 daily for appetite stimulation. - Consider refeeding precautions. Fall Imaging negative for acute fracture/head injury.  Likeliest etiology: Orthostasis in the setting of hypervolemia. Other conditions which could have contributed are undergoing workup: infection, seizure. - Provided nursing home [facility] assisted living) with Hospital fax number, awaiting labs/notes. - Ordered delirium precautions. - Continue to await UA/CBC, both ordered.   - CK ordered, do not have a clear timeline as to how long he was down. - Awaiting EEG read, patient has history of complex partial seizure. - PT/OT eval and treat Prolonged Q-T interval on ECG QTc 577 ms ? corrected to 419.  QRS duration: 99 ms.  - Patient on telemetry, continue to observe for abnormal heart rhythms. - Avoid QTc prolonging medications. Hypernatremia 153 ? 154.  Over the last 24 hours, given IV LR 500 mg bolus alongside 1.2 L of IV LR continuous.  Calculated free water deficit of 2.7 L. - Begin BMPs every 6 hours to monitor sodium levels, adjust IV repletion as appropriate. - We will continue IV LR and switch to D5W after repeat BMP afternoon of 10/7. - Monitor I/Os Leukocytosis Awaiting repeat CBC, ordered to follow-up WBC of 14. Patient afebrile.  Patient is regularly incontinent of urine, therefore less concern for UTI. - Awaiting repeat CBC, as above. - Awaiting UA as above. - Continue to monitor for other infectious symptoms including: Fever, dysuria, new cough, BP changes.  Chronic and Stable Issues: SVT, s/p ablation: NSR on EKG Complex partial seizure on long-term Keppra: No focal findings.  No recent seizure, per son.  Switched to  IV Keppra ISO dysphagia.  FEN/GI: NPO, p.o. meds with pure per SLP recs PPx: Subcu Lovenox Dispo: Likely return to ALF.   Subjective:   PO meds held overnight after patient failed to swallow.  Given IV  Keppra x1.  On interview, patient is sleepy, lying in bed.  SLP in her room, conducting dysphagia evaluation.  It appears that patient is coughing up thin liquids.  Son, [name], is in the room.  Discussed patient's stated goals of care/presence of living will. Per son, a few years ago patient said that he would want "everything done."  There may be a living will at the ALF patient was staying at -- son will reach out to see if he can find it.  Discussed reasoning behind palliative care consult, son amenable to having a conversation with them.  Per son, patient's baseline has been increasing dysphagia, 2 to 3 days a week.  Other days, per son, patient is a lot better, wakes up in the morning, is active.  There have been fewer of these days over the past few months.  Reassured son that we would continue to monitor him for signs of improvement.  Objective:  BP: 91/76 HR: 99 RR: 19 T: Afebrile. O2sat: 94% on room air  Significant vitals over past 24 hours: Hypotensive to 89/63.  Tachycardic to 106.  Physical Exam:  General: Cachectic, chronically ill-appearing, lying in bed, NAD. Cardiovascular: RRR no m/r/g.  On telemetry. Respiratory: CTAB. No w/r/r.  GI: Some tenderness to palpation.  Extremities: Diffuse muscle wasting.  Generalized weakness. Neuro: Tired, frustrated unable to participate in exam.  Basic labs:  Most recent CBC Lab Results  Component Value Date   WBC 14.5 (H) 07/27/2023   HGB 12.9 (L) 07/27/2023   HCT 44.1 07/27/2023   MCV 92.8 07/27/2023   PLT 275 07/27/2023   Most recent BMP    Latest Ref Rng & Units 07/28/2023    1:54 AM  BMP  Glucose 70 - 99 mg/dL 98   BUN 8 - 23 mg/dL 27   Creatinine 1.61 - 1.24 mg/dL 0.96   Sodium 045 - 409 mmol/L 154   Potassium 3.5 - 5.1 mmol/L 3.9   Chloride 98 - 111 mmol/L 115   CO2 22 - 32 mmol/L 23   Calcium 8.9 - 10.3 mg/dL 8.9     Other pertinent labs:  Awaiting BMP Awaiting CBC Awaiting CK Awaiting UA  Imaging/Diagnostic  Tests:  No new imaging.  Tomie China, MD 07/28/2023, 7:28 AM  PGY-1, [facility] FPTS Intern pager: [phone], text pages welcome Secure chat group [facility] Teaching Service

## 2023-07-28 NOTE — Assessment & Plan Note (Signed)
Workup was significant for hypernatremic to 153.  Likely in setting of hypovolemia, although patient has had chronic poor p.o. intake since at least January. - Begin continuous IV LR 100 cc/hour (maintenance) - Taper once patient is cleared for p.o. fluids - Monitor I/Os

## 2023-07-28 NOTE — Assessment & Plan Note (Addendum)
Awaiting repeat CBC, ordered to follow-up WBC of 14. Patient afebrile.  Patient is regularly incontinent of urine, therefore less concern for UTI. - Awaiting repeat CBC, as above. - Awaiting UA as above. - Continue to monitor for other infectious symptoms including: Fever, dysuria, new cough, BP changes.

## 2023-07-29 DIAGNOSIS — F03C0 Unspecified dementia, severe, without behavioral disturbance, psychotic disturbance, mood disturbance, and anxiety: Secondary | ICD-10-CM

## 2023-07-29 DIAGNOSIS — Z66 Do not resuscitate: Secondary | ICD-10-CM | POA: Diagnosis not present

## 2023-07-29 DIAGNOSIS — Z7189 Other specified counseling: Secondary | ICD-10-CM | POA: Diagnosis not present

## 2023-07-29 DIAGNOSIS — W19XXXA Unspecified fall, initial encounter: Secondary | ICD-10-CM | POA: Diagnosis not present

## 2023-07-29 DIAGNOSIS — Z515 Encounter for palliative care: Secondary | ICD-10-CM | POA: Diagnosis not present

## 2023-07-29 DIAGNOSIS — E43 Unspecified severe protein-calorie malnutrition: Secondary | ICD-10-CM | POA: Insufficient documentation

## 2023-07-29 LAB — BASIC METABOLIC PANEL
Anion gap: 12 (ref 5–15)
Anion gap: 16 — ABNORMAL HIGH (ref 5–15)
BUN: 23 mg/dL (ref 8–23)
BUN: 21 mg/dL (ref 8–23)
CO2: 23 mmol/L (ref 22–32)
CO2: 24 mmol/L (ref 22–32)
Calcium: 8.5 mg/dL — ABNORMAL LOW (ref 8.9–10.3)
Calcium: 8.6 mg/dL — ABNORMAL LOW (ref 8.9–10.3)
Chloride: 118 mmol/L — ABNORMAL HIGH (ref 98–111)
Chloride: 114 mmol/L — ABNORMAL HIGH (ref 98–111)
Creatinine, Ser: 0.77 mg/dL (ref 0.61–1.24)
Creatinine, Ser: 0.69 mg/dL (ref 0.61–1.24)
GFR, Estimated: >60 mL/min (ref >60)
GFR, Estimated: >60 mL/min (ref >60)
Glucose, Bld: 86 mg/dL (ref 70–99)
Glucose, Bld: 77 mg/dL (ref 70–99)
Potassium: 4.1 mmol/L (ref 3.5–5.1)
Potassium: 3.5 mmol/L (ref 3.5–5.1)
Sodium: 153 mmol/L — ABNORMAL HIGH (ref 135–145)
Sodium: 154 mmol/L — ABNORMAL HIGH (ref 135–145)

## 2023-07-29 MED ORDER — HALOPERIDOL LACTATE 2 MG/ML PO CONC: 0.5000 mg | ORAL | Status: DC | PRN

## 2023-07-29 MED ORDER — DEXTROSE 5 % IV SOLN: INTRAVENOUS | Status: DC

## 2023-07-29 MED ORDER — ONDANSETRON 4 MG PO TBDP: 4.0000 mg | ORAL_TABLET | Freq: Four times a day (QID) | ORAL | Status: DC | PRN

## 2023-07-29 MED ORDER — ACETAMINOPHEN 325 MG PO TABS: 650.0000 mg | ORAL_TABLET | Freq: Four times a day (QID) | ORAL | Status: DC | PRN

## 2023-07-29 MED ORDER — ACETAMINOPHEN 650 MG RE SUPP: 650.0000 mg | Freq: Four times a day (QID) | RECTAL | Status: DC | PRN

## 2023-07-29 MED ORDER — MEDIHONEY WOUND/BURN DRESSING EX PSTE
1.0000 | PASTE | Freq: Every day | CUTANEOUS | Status: DC
  Administered 2023-07-29: 1 via TOPICAL
  Filled 2023-07-29: qty 44

## 2023-07-29 MED ORDER — LORAZEPAM 1 MG PO TABS: 1.0000 mg | ORAL_TABLET | ORAL | Status: DC | PRN

## 2023-07-29 MED ORDER — MORPHINE SULFATE (PF) 2 MG/ML IV SOLN
1.0000 mg | INTRAVENOUS | Status: DC | PRN
  Administered 2023-07-30: 1 mg via INTRAVENOUS
  Filled 2023-07-29 (×2): qty 1

## 2023-07-29 MED ORDER — ONDANSETRON HCL 4 MG/2ML IJ SOLN: 4.0000 mg | Freq: Four times a day (QID) | INTRAMUSCULAR | Status: DC | PRN

## 2023-07-29 MED ORDER — GLYCOPYRROLATE 1 MG PO TABS: 1.0000 mg | ORAL_TABLET | ORAL | Status: DC | PRN

## 2023-07-29 MED ORDER — BIOTENE DRY MOUTH MT LIQD: 15.0000 mL | OROMUCOSAL | Status: DC | PRN

## 2023-07-29 MED ORDER — HALOPERIDOL 0.5 MG PO TABS: 0.5000 mg | ORAL_TABLET | ORAL | Status: DC | PRN

## 2023-07-29 MED ORDER — LACTATED RINGERS IV SOLN: INTRAVENOUS | Status: DC

## 2023-07-29 MED ORDER — POLYVINYL ALCOHOL 1.4 % OP SOLN: 1.0000 [drp] | Freq: Four times a day (QID) | OPHTHALMIC | Status: DC | PRN

## 2023-07-29 MED ORDER — LORAZEPAM 2 MG/ML PO CONC: 1.0000 mg | ORAL | Status: DC | PRN

## 2023-07-29 MED ORDER — GLYCOPYRROLATE 0.2 MG/ML IJ SOLN: 0.2000 mg | INTRAMUSCULAR | Status: DC | PRN

## 2023-07-29 MED ORDER — HALOPERIDOL LACTATE 5 MG/ML IJ SOLN: 0.5000 mg | INTRAMUSCULAR | Status: DC | PRN

## 2023-07-29 MED ORDER — LORAZEPAM 2 MG/ML IJ SOLN: 1.0000 mg | INTRAMUSCULAR | Status: DC | PRN

## 2023-07-29 NOTE — Progress Notes (Signed)
Dr. Pollie Meyer was informed that cardiac monitoring expired 12 hrs ago and asked if wanted to continued. New order to continue

## 2023-07-29 NOTE — Assessment & Plan Note (Signed)
Writer was present for palliative care meeting with family and conducted by Wynne Dust, NP.  In short, discussed current prognosis and factors affecting patient's quality of life, including: Dysphagia, discomfort related to medication/therapy administration, sacral wound with no possibility of healing, worsening dementia picture. Also discussed futility of current medical therapies to change the trajectory of this patient's course. After careful consideration and weighing of the options, family decided to change code status to DNR/DNI/comfort care to improve patient quality of remaining life and minimize suffering.  Appears to be in line with patient's current goals, as he has expressed frustration with repeated IV draws, attempted PT/OT therapy.  See palliative care note for greater detail.  - Current plan is for patient to return to ALF with home hospice ([facility]).  Per palliative, will likely reach out tomorrow.  Have discontinued all curative medications, SVT prophylaxis, labs, PT/OT therapy and begun the following via EOL order set: - PRN acetaminophen 650 mg PR vs p.o. every 6 hours as needed for mild pain. - PRN antiseptic oral rinse for dry mouth. - PRN Robinul 1 mg every 4 H for excessive secretions.  Add on subcu and IV Robinul if needed. - PRN Haldol 0.5 mg every 4 hours - IV LR infusion 100 mL/h to maintain volume status. - IV Keppra for seizure prophylaxis - PRN Ativan PO versus IV every 4 hours for anxiety. - PRN IV morphine 1 to 4 mg for severe pain, air hunger. - PRN Zofran 4 mg sublingual vs IV for nausea - Liquifilm Tears 4 dry eyes

## 2023-07-29 NOTE — Assessment & Plan Note (Addendum)
Sodium: 154 ? 154 ? 153 for last 24 hours.  Patient refusing a.m. labs.  Calculated free water deficit of 2.7 L. - Had deferred further treatment/workup for hypernatremia in the setting of new goals of care. - Will continue maintenance IV LR as above to maintain volume status.  Family understands that this will not significantly affect his current prognosis.

## 2023-07-29 NOTE — Assessment & Plan Note (Addendum)
Imaging negative for fracture.  EEG negative.  CK WNL.  Patient refusing PT/OT eval.   - Deferred further workup ISO comfort care.

## 2023-07-29 NOTE — Assessment & Plan Note (Addendum)
Has increasingly poor p.o. intake over the last 10 or so months.  SLP eval 10/7: Extreme dysphagia, secondary to progressive dementia.  Family gave consent for comfort feeds, although he would likely not experience any clinical benefit at this time, and risks of aspiration and acute deterioration.  Patient is having extreme difficulty taking medications by mouth, has been refusing p.o. and IV meds. - On dysphagia 1 diet for comfort feeds, if desired.  Family can feed him whenever he prefers.  Family is well aware of risk of aspiration. - Family voiced some interest in repeat SLP evaluation to confirm dysphagia diagnosis.  Voicemail left.

## 2023-07-29 NOTE — Consult Note (Signed)
WOC Nurse Consult Note: Reason for Consult: Consult requested for coccyx.  Sons at the bedside to discuss plan of care. Pt is very emaciated with protruding bones and is immobile.  Wound type: Unstageable pressure injury to coccyx, 3X1cm, 100% yellow slough, mod amt yellow drainage Pressure Injury POA: Yes Dressing procedure/placement/frequency: Air mattress ordered to reduce pressure. Nutrition consult requested. Topical treatment orders provided for bedside nurses to perform as follows to assist with remioval of nonviable tissue: Apply Medihoney to sacrum wound Q day, then cover with foam dressing.  Change foam dressing Q 3 days or PRN soiling. Please re-consult if further assistance is needed.  Thank-you,  Cammie Mcgee MSN, RN, CWOCN, [place], CNS [phone]

## 2023-07-29 NOTE — Assessment & Plan Note (Addendum)
WBC 14.5 ? 12.3.  Continues to be afebrile no new infectious symptoms. - Defer further labs ISO new GOC.

## 2023-07-29 NOTE — Progress Notes (Signed)
Daily Progress Note Intern Pager: [phone]  Patient name: [name] Medical record number: [id number] Date of birth: 01/17/1934 Age: 89 y.o. Gender: male  Primary Care Provider: Nadara Eaton, MD Consultants: Palliative care Code Status: Full  Pt Overview and Major Events to Date:   Cletis Clack is an 89 year old male with a history of progressive dementia and acutely worsening p.o. intake complicated by 30 pound weight loss since January, history of complex partial seizure well-controlled on Keppra, right foot/ankle injury, SVT status post ablation who presented to [facility] ED after a fall in setting of likely dehydration and orthostasis.  Found to have no acute fractures/injury, however patient is profoundly cachectic and weak with hypernatremia to 153.  Patient admitted for fall workup, fluid repletion, and diet modification.  As of early p.m. 10/8, patient has been made DNR/DNI/goals of care.  Will adjust medications accordingly.  Likely return to ALF with hospice tomorrow. Assessment & Plan Goals of care, counseling/discussion Writer was present for palliative care meeting with family and conducted by Wynne Dust, NP.  In short, discussed current prognosis and factors affecting patient's quality of life, including: Dysphagia, discomfort related to medication/therapy administration, sacral wound with no possibility of healing, worsening dementia picture. Also discussed futility of current medical therapies to change the trajectory of this patient's course. After careful consideration and weighing of the options, family decided to change code status to DNR/DNI/comfort care to improve patient quality of remaining life and minimize suffering.  Appears to be in line with patient's current goals, as he has expressed frustration with repeated IV draws, attempted PT/OT therapy.  See palliative care note for greater detail.  - Current plan is for patient to return to ALF with home hospice  ([facility]).  Per palliative, will likely reach out tomorrow.  Have discontinued all curative medications, SVT prophylaxis, labs, PT/OT therapy and begun the following via EOL order set: - PRN acetaminophen 650 mg PR vs p.o. every 6 hours as needed for mild pain. - PRN antiseptic oral rinse for dry mouth. - PRN Robinul 1 mg every 4 H for excessive secretions.  Add on subcu and IV Robinul if needed. - PRN Haldol 0.5 mg every 4 hours - IV LR infusion 100 mL/h to maintain volume status. - IV Keppra for seizure prophylaxis - PRN Ativan PO versus IV every 4 hours for anxiety. - PRN IV morphine 1 to 4 mg for severe pain, air hunger. - PRN Zofran 4 mg sublingual vs IV for nausea - Liquifilm Tears 4 dry eyes Protein-calorie malnutrition (HCC) (Resolved: 07/29/2023) Has increasingly poor p.o. intake over the last 10 or so months.  SLP eval 10/7: Extreme dysphagia, secondary to progressive dementia.  Family gave consent for comfort feeds, although he would likely not experience any clinical benefit at this time, and risks of aspiration and acute deterioration.  Patient is having extreme difficulty taking medications by mouth, has been refusing p.o. and IV meds. - On dysphagia 1 diet for comfort feeds, if desired.  Family can feed him whenever he prefers.  Family is well aware of risk of aspiration. - Family voiced some interest in repeat SLP evaluation to confirm dysphagia diagnosis.  Voicemail left. Fall (Resolved: 07/29/2023) Imaging negative for fracture.  EEG negative.  CK WNL.  Patient refusing PT/OT eval.   - Deferred further workup ISO comfort care. Hypernatremia (Resolved: 07/29/2023) Sodium: 154 ? 154 ? 153 for last 24 hours.  Patient refusing a.m. labs.  Calculated free  water deficit of 2.7 L. - Had deferred further treatment/workup for hypernatremia in the setting of new goals of care. - Will continue maintenance IV LR as above to maintain volume status.  Family understands that  this will not significantly affect his current prognosis. Prolonged Q-T interval on ECG (Resolved: 07/29/2023) QTc 577 ms ? corrected to 419.  QRS duration: 99 ms.  Continued telemetry. - Defer further treatment/workup ISO goals of care Leukocytosis (Resolved: 07/29/2023) WBC 14.5 ? 12.3.  Continues to be afebrile no new infectious symptoms. - Defer further labs ISO new GOC.  Chronic and Stable Issues:  SVT, s/p ablation: NSR on EKG Complex partial seizure on long-term Keppra: No focal findings.  No recent seizure, per son.  Switched to IV Keppra ISO dysphagia.  FEN/GI: On dysphagia 1 diet per family preference, comfort feeds. PPx: Discontinued in setting of comfort care. Dispo: Home with hospice to previous ALF  Subjective:   No acute events overnight.  On interview, sons in room.  Patient repeatedly asked to be left alone to sleep.  Desires to go to get up to go to the bathroom.  Family asked about options for feeding, as well as current medical status.  Discussed that we would be treating his hypernatremia, but that would not necessarily translate to an improved clinical status.  Reiterated that father is extremely ill and that PO feeds will likely not have any therapeutic/nutritional effect, and pose a significant risk for aspiration.  Thoroughly discussed current medical problems, and overall clinical picture.  Emphasized further expiration of these topics with with palliative care medicine, later in day.  Objective:  BP: 110/79 HR: 88 RR: 18 T: 97.6 O2sat: 96% on room air  Significant vitals over past 24 hours:   Physical Exam:  General: Cachectic, chronically ill-appearing, lying in bed, NAD. Cardiovascular: RRR no m/r/g.  On telemetry. Respiratory: CTAB. No w/r/r.  GI: Some tenderness to palpation.  Extremities: Diffuse muscle wasting.  Generalized weakness. Neuro: Tired, frustrated, unable to participate in exam.  Basic labs:  Most recent CBC Lab Results  Component  Value Date   WBC 12.3 (H) 07/28/2023   HGB 12.9 (L) 07/28/2023   HCT 43.8 07/28/2023   MCV 92.0 07/28/2023   PLT 237 07/28/2023   Most recent BMP    Latest Ref Rng & Units 07/29/2023    1:26 AM  BMP  Glucose 70 - 99 mg/dL 86   BUN 8 - 23 mg/dL 23   Creatinine 4.54 - 1.24 mg/dL 0.98   Sodium 119 - 147 mmol/L 153   Potassium 3.5 - 5.1 mmol/L 4.1   Chloride 98 - 111 mmol/L 118   CO2 22 - 32 mmol/L 23   Calcium 8.9 - 10.3 mg/dL 8.5     Other pertinent labs:  Deferred new labs.  Imaging/Diagnostic Tests:  No new imaging.  Tomie China, MD 07/29/2023, 7:26 AM  PGY-1, [facility] FPTS Intern pager: [phone], text pages welcome Secure chat group [facility] Teaching Service

## 2023-07-29 NOTE — Progress Notes (Signed)
SLP Cancellation Note  Patient Details Name: [name] MRN: [id number] DOB: Dec 09, 1933   Cancelled treatment:       Reason Eval/Treat Not Completed: Other (comment) Since MBS on previous date, pt has been started on comfort feeds and goals are now for full comfort. Reached out to palliative care and medical team, and per palliative care, there was a question at one point about if a second opinion was wanted from SLP. Will stay on board in case family wants this (palliative care left message to see if they still did) and will be available PRN for education.     Mahala Menghini., M.A. CCC-SLP Acute Rehabilitation Services Office [phone]  Secure chat preferred  07/29/2023, 4:07 PM

## 2023-07-29 NOTE — Progress Notes (Signed)
Patient refused am labs. 

## 2023-07-29 NOTE — TOC Initial Note (Signed)
Transition of Care [facility]) - Initial/Assessment Note    Patient Details  Name: [name] MRN: [id number] Date of Birth: Aug 22, 1934  [facility] CM/SW Contact:    Stephen Hammed, LCSW Phone Number: 07/29/2023, 3:35 PM  Clinical Narrative:                  CSW notified by [name] with Palliative that family has decided to transition  to comfort and they would like for pt to return to [facility] with hospice support. CSW spoke with [facility] and they are working on getting approval for pt to return with hospice and they would need a hospital bed. CSW was advised that [facility] contracts with [facility]. CSW made referral and they will review. CSW spoke with sons and they are agreeable to plan. Will use [facility] to transport.TOC will continue to follow for DC needs.  Expected Discharge Plan: Assisted Living Barriers to Discharge: Barriers Unresolved (comment) (ALF with hospital bed and facility approval.)   Patient Goals and CMS Choice Patient states their goals for this hospitalization and ongoing recovery are:: Pt unable to participate in goal setting at this time. CMS Medicare.gov Compare Post Acute Care list provided to:: Patient Represenative (must comment) Choice offered to / list presented to : Adult Children      Expected Discharge Plan and Services     Post Acute Care Choice: Hospice Living arrangements for the past 2 months: Assisted Living Facility                                      Prior Living Arrangements/Services Living arrangements for the past 2 months: Assisted Living Facility Lives with:: Facility Resident Patient language and need for interpreter reviewed:: Yes Do you feel safe going back to the place where you live?: Yes      Need for Family Participation in Patient Care: Yes (Comment) Care giver support system in place?: Yes (comment)   Criminal Activity/Legal Involvement Pertinent to Current  Situation/Hospitalization: No - Comment as needed  Activities of Daily Living      Permission Sought/Granted Permission sought to share information with : Family Supports, Oceanographer granted to share information with : Yes, Verbal Permission Granted  Share Information with NAME: [name]  Permission granted to share info w AGENCY: [facility]  Permission granted to share info w Relationship: Son     Emotional Assessment Appearance:: Appears stated age Attitude/Demeanor/Rapport: Unable to Assess Affect (typically observed): Unable to Assess   Alcohol / Substance Use: Not Applicable Psych Involvement: No (comment)  Admission diagnosis:  Hypernatremia [E87.0] Severe protein-calorie malnutrition (HCC) [E43] Prolonged Q-T interval on ECG [R94.31] Fall [W19.XXXA] Fall, initial encounter [id number]] Leukocytosis, unspecified type [D72.829] Weakness [R53.1] Patient Active Problem List   Diagnosis Date Noted   Protein-calorie malnutrition, severe 07/29/2023   Goals of care, counseling/discussion 07/29/2023   Weakness 07/28/2023   Acquired hypothyroidism 04/11/2018   Elevated troponin 10/14/2017   Mixed hyperlipidemia 10/14/2017   Other chest pain 10/14/2017   SVT (supraventricular tachycardia) (HCC) 10/13/2017   Impacted cerumen of both ears 06/25/2017   Sensorineural hearing loss (SNHL), bilateral 06/25/2017   Pain in right ankle and joints of right foot 03/03/2017   Acute left-sided low back pain without sciatica 03/03/2017   Post-traumatic osteoarthritis, right ankle and foot 03/03/2017   Dementia (HCC) 04/07/2014   Hx of colonic polyps  04/07/2014   Hyperlipemia 04/07/2014   Chronic obstructive asthma (HCC) 04/07/2014   Esophagus, Barrett's 04/07/2014   ACUTE BRONCHITIS 09/05/2009   ALLERGIC RHINITIS 09/26/2007   Asthma with bronchitis 09/26/2007   ESOPHAGEAL REFLUX 09/26/2007   Seizures (HCC) 09/26/2007   PCP:  Stephen Eaton,  MD Pharmacy:   [facility] - [place], [place] - [address] [place] [address] Phone: [phone] Fax: [phone]  [facility] - [place], [place] - [address] [place] [address] Phone: [phone] Fax: [phone]     Social Determinants of Health (SDOH) Social History: SDOH Screenings   Social Connections: Unknown (01/08/2020)   Received from [facility]  Tobacco Use: Low Risk  (07/27/2023)   SDOH Interventions:     Readmission Risk Interventions     No data to display

## 2023-07-29 NOTE — Progress Notes (Addendum)
Initial Nutrition Assessment  DOCUMENTATION CODES:   Severe malnutrition in context of social or environmental circumstances, Underweight  INTERVENTION:  Per review of chart patient now transitioning to comfort measures. No further nutrition interventions planned at this time.  Please re-consult RD as needed.  NUTRITION DIAGNOSIS:   Severe Malnutrition related to social / environmental circumstances (inadequate oral intake in setting of decreasing appetite over time, also compounded by increased needs for wound healing) as evidenced by severe fat depletion, severe muscle depletion.  GOAL:   Patient will meet greater than or equal to 90% of their needs  MONITOR:   PO intake, Supplement acceptance, Labs, Weight trends, Skin, I & O's  REASON FOR ASSESSMENT:   Consult Assessment of nutrition requirement/status, Wound healing  ASSESSMENT:   89 year old male with PMHx of progressive dementia and acutely worsening PO intake complicated by 30 lb weight loss since January, hx complex partial seizures well-controlled on Keppra admitted after a fall in setting of likely dehydration and orthostasis.  10/7: s/p MBS with recommendation to be NPO; after family discussed with team diet of dysphagia 1 with thin liquids was ordered to allow for comfort feeds  Family was seen by Palliative Medicine yesterday with plan to have follow-up discussion today to further discuss goals of care. Sons report they likely want to focus on comfort feeds and would not want to pursue a feeding tube, but plan to discuss this further with team and Palliative Medicine today.  Met with patient's sons at bedside. Pt briefly woke but then went back to sleep. Son reports pt is from [facility]. He reports over the past month pt has been eating and drinking less. He would still go to the dining room for lunch and dinner but would eat less at meals than usual. Son reports his baseline diet was regular  texture with thin liquids. Pt drinks chocolate Boost twice daily at baseline and enjoys these supplements. Over the past week prior to admission pt had very poor PO intake as he was sleeping most of the day, so was not waking up to eat meals. Denies any food allergies or intolerances. Denies any known nausea, emesis, or abdominal pain PTA. Son reports pt has some poor dentition so he chews carefully at baseline. Son reports pt has tried [employer] supplements before and did not like them. Son reports pt would prefer chocolate flavor of supplements. Patient's son did not feel he could take any vitamins by mouth at this time as he was having difficulty taking medications.  Son reports pt's UBW was around 130 lbs and he last weighed this in January 2024. He reports weight has fluctuated up and down but overall he has lost approximately 30 lbs. Son suspected admission wt of 63.5 kg (140 lbs) was not accurate. It does appear it might have been pulled forward from a previous weight from 03/15/22. RD obtained bed scale weight of 51.6 kg (113.76 lbs). Did not want to disturb pt to remove blankets and pillows as he was sleeping comfortably, but son feels he likely weighs even less than this now.  Medications reviewed and include: Colace 100 mg daily, levothyroxine, Keppra  Labs reviewed: Sodium 154, Chloride 114, Potassium WNL. On 10/7 phosphorus and magnesium WNL.  UOP: 400 mL UOP (0.3 mL/kg/hr) in previous 24 hrs  I/O: +474.5 mL since admission  Discussed with team via secure chat plan to wait and see how conversation with Palliative Medicine goes today. Noted in afternoon pt  now ordered for comfort measures.  NUTRITION - FOCUSED PHYSICAL EXAM:  Flowsheet Row Most Recent Value  Orbital Region Severe depletion  Upper Arm Region Severe depletion  Thoracic and Lumbar Region Severe depletion  Buccal Region Severe depletion  Temple Region Severe depletion  Clavicle Bone Region Severe depletion  Clavicle and  Acromion Bone Region Severe depletion  Scapular Bone Region Unable to assess  Dorsal Hand Severe depletion  Patellar Region Severe depletion  Anterior Thigh Region Severe depletion  Posterior Calf Region Severe depletion  Edema (RD Assessment) None  Hair Reviewed  Eyes Unable to assess  Mouth Unable to assess  Skin Reviewed  Nails Reviewed      Diet Order:   Diet Order             DIET - DYS 1 Room service appropriate? Yes; Fluid consistency: Thin  Diet effective now                  EDUCATION NEEDS:   Not appropriate for education at this time  Skin:  Skin Assessment: Skin Integrity Issues: Skin Integrity Issues:: Unstageable Unstageable: coccyx  Last BM:  07/28/23 per chart  Height:   Ht Readings from Last 1 Encounters:  07/27/23 5\' 9"  (1.753 m)   Weight:   Wt Readings from Last 1 Encounters:  07/29/23 51.6 kg   Ideal Body Weight:  72.7 kg  BMI:  Body mass index is 16.8 kg/m.  Estimated Nutritional Needs:   Kcal:  1600-1800  Protein:  80-90 grams  Fluid:  1.5-1.8 L/day  Letta Median, MS, RD, LDN, CNSC Pager number available on Amion

## 2023-07-29 NOTE — Progress Notes (Signed)
Daily Progress Note   Patient Name: [name]       Date: 07/29/2023 DOB: August 30, 1934  Age: 89 y.o. MRN#: [id number] Attending Physician: Stephen Laroche, DO Primary Care Physician: Stephen Eaton, MD Admit Date: 07/27/2023 Length of Stay: 1 day  Reason for Consultation/Follow-up: Establishing goals of care  HPI/Patient Profile:  89 y.o. male  with past medical history of progressive dementia and acutely worsening p.o. intake complicated by 30 pound weight loss since January, history of complex partial seizure well-controlled on Keppra, right foot/ankle injury, SVT status post ablation who presented to [name] ED after a fall in setting of likely dehydration and orthostasis.  Found to have no acute fractures/injury, however patient is profoundly cachectic and weak with hypernatremia to 153.  Patient admitted for fall workup, fluid repletion, and diet modification.   Palliative medicine was consulted for GOC conversations.  Subjective:   Subjective: Chart Reviewed. Updates received. Patient Assessed. Created space and opportunity for patient  and family to explore thoughts and feelings regarding current medical situation.  Today's Discussion: Today met the patient at the bedside, although he was sleeping and I elected not to wake him.  Also at the bedside with the patient's son [name] and [name].  We excused ourselves to the conference room for conversation.  We are also joined by family medicine resident Dr. Tomie Brown.  We reviewed information from the SLP evaluation yesterday including results of modified barium swallow.  We discussed with the patient has significant dysphagia and is at high risk for aspiration, which is why he was initially made NPO.  However yesterday with family's insistence they allowed some foods and drink.  We again reviewed his trajectory over the previous months.  After extensive discussion we came to the agreement, and they agreed, that there is nothing  medically that we can do to improve his current situation.  The only thing is currently being treated is hypernatremia, which is not affecting his comfort or prognosis.  After our discussions they have elected to transition to comfort care.  They have also elected to change his status to DNR-comfort.  I explained to comfort care as the patient would no longer receive aggressive medical interventions such as continuous vital signs, lab work, radiology testing, or medications not focused on comfort. All care would focus on how the patient is looking and feeling. This would include management of any symptoms that may cause discomfort, pain, shortness of breath, cough, nausea, agitation, anxiety, and/or secretions etc. Symptoms would be managed with medications and other non-pharmacological interventions such as spiritual support if requested, repositioning, music therapy, or therapeutic listening. Family verbalized understanding and appreciation.    We had further extensive discussion about Stephen as an option and the various ways that can be provided.  I described Stephen as a service for patients who have a life expectancy of 6 months or less. The goal of Stephen is the preservation of dignity and quality at the end phases of life. Under Stephen care, the focus changes from curative to symptom relief.  They have agreed to Stephen care as well.  Their apparent preferred disposition would be discharge back to [name] assisted living facility with Stephen services in place.  We discussed that [name] could call [name] and determine if this is possible as well as if they have a preferred Stephen provider at their facility.  We agreed to continue to work toward discharge to [name] with Stephen services and will continue  to keep in touch.  I discussed with the social worker our options and she indicated she would be working on this.  About an hour and a half later I received a phone call from the  patient's son.  They have independently spoken with [name] who indicated they could except the patient under Stephen services back to assisted living.  They also indicated they have a preferred agreement with [name].  The patient indicated they would like to move forward with this set up.  I reached out and updated the medical team and TOC.  I provided emotional and general support through therapeutic listening, empathy, sharing of stories, and other techniques. I answered all questions and addressed all concerns to the best of my ability.  Review of Systems  Unable to perform ROS: Acuity of condition    Objective:   Vital Signs:  BP 94/64 (BP Location: Right Arm)   Pulse 91   Temp 97.6 F (36.4 C) (Axillary)   Resp 18   Ht 5\' 9"  (1.753 m)   Wt 51.6 kg Comment: bed scale  SpO2 96%   BMI 16.80 kg/m   Physical Exam: Physical Exam Vitals and nursing note reviewed.  Constitutional:      General: He is sleeping. He is not in acute distress. Pulmonary:     Effort: Pulmonary effort is normal. No respiratory distress.     Palliative Assessment/Data: 20%    Existing Vynca/ACP Documentation: None  Assessment & Plan:   Impression: Present on Admission:  Fall  (Resolved) Leukocytosis  89 year old male with chronic comorbidities and acute presentations as described above. He seems to be has been having a more rapid progression of his dementia. It appears he has not had difficulty with oral intake for about the past many months to year, this is worsened over the past several weeks. Patient's family states that he has done this before and seems to bounce back. However, they understand at some point he will not bounce back. After extensive discussion on options and disease progression and prognosis over the past day or two, they had decided to transition to comfort care and engaged with Stephen back at his assisted living facility at [name].  TOC has been  engaged.  Overall long-term prognosis poor   SUMMARY OF RECOMMENDATIONS   Changed to DNR-comfort Transition to comfort care Symptom management orders per below Eliminate unnecessary medications, workup, and interventions TOC referral for evaluation for "home" Stephen at [facility] Palliative medicine will continue to follow daily for symptom management on comfort care  Symptom Management:  Tylenol 650 mg PR every 6 hours as needed mild pain or fever Robinul 0.2 mg IV every 4 hours as needed excessive secretions Haldol 0.5 mg IV every 4 hours as needed agitation or delirium Continue Keppra for seizure prophylaxis Ativan 1 mg IV every 4 hours as needed anxiety or fear Morphine 1 to 4 mg IV every 15 minutes as needed severe pain Zofran 4 mg IV every 6 hours as needed nausea Polyvinyl alcohol 1.4% ophthalmic 1 drop OU 4 times daily as needed dry eyes  Code Status: DNR-comfort  Prognosis: < 2 weeks  Discharge Planning:  Assisted Living Facility with Stephen  Discussed with: Patient's family, medical team, nursing team, [facility] team  Thank you for allowing [place] to participate in the care of DELONTA YOHANNES PMT will continue to support holistically.  Time total: 60 min Separate ACP Time: 20 min  Detailed review of  medical records (labs, imaging, vital signs), medically appropriate exam, discussed with treatment team, counseling and education to patient, family, & staff, documenting clinical information, medication management, coordination of care  Wynne Dust, NP Palliative Medicine Team  Team Phone # [phone] (Nights/Weekends)  06/19/2021, 8:17 AM

## 2023-07-29 NOTE — Assessment & Plan Note (Addendum)
QTc 577 ms ? corrected to 419.  QRS duration: 99 ms.  Continued telemetry. - Defer further treatment/workup ISO goals of care

## 2023-07-29 NOTE — Plan of Care (Signed)
  Problem: Health Behavior/Discharge Planning: Goal: Ability to manage health-related needs will improve Outcome: Progressing   Problem: Clinical Measurements: Goal: Ability to maintain clinical measurements within normal limits will improve Outcome: Progressing Goal: Will remain free from infection Outcome: Progressing Goal: Diagnostic test results will improve Outcome: Not Progressing Goal: Respiratory complications will improve Outcome: Progressing Goal: Cardiovascular complication will be avoided Outcome: Progressing   Problem: Activity: Goal: Risk for activity intolerance will decrease Outcome: Progressing   Problem: Nutrition: Goal: Adequate nutrition will be maintained Outcome: Not Progressing   Problem: Coping: Goal: Level of anxiety will decrease Outcome: Progressing   Problem: Elimination: Goal: Will not experience complications related to bowel motility Outcome: Progressing Goal: Will not experience complications related to urinary retention Outcome: Progressing   Problem: Skin Integrity: Goal: Risk for impaired skin integrity will decrease Outcome: Progressing   Problem: Pain Managment: Goal: General experience of comfort will improve Outcome: Progressing

## 2023-07-30 DIAGNOSIS — Z7189 Other specified counseling: Secondary | ICD-10-CM | POA: Diagnosis not present

## 2023-07-30 MED ORDER — BIOTENE DRY MOUTH MT LIQD: 15.0000 mL | OROMUCOSAL | Status: AC | PRN

## 2023-07-30 MED ORDER — MORPHINE SULFATE (PF) 2 MG/ML IV SOLN: 1.0000 mg | INTRAVENOUS | 0 refills | Status: DC | PRN

## 2023-07-30 MED ORDER — GLYCOPYRROLATE 1 MG PO TABS: 1.0000 mg | ORAL_TABLET | ORAL | Status: AC | PRN

## 2023-07-30 MED ORDER — LORAZEPAM 1 MG PO TABS: 1.0000 mg | ORAL_TABLET | ORAL | 0 refills | Status: AC | PRN

## 2023-07-30 MED ORDER — MORPHINE SULFATE (PF) 2 MG/ML IV SOLN: 1.0000 mg | INTRAVENOUS | Status: DC | PRN

## 2023-07-30 MED ORDER — ONDANSETRON 4 MG PO TBDP: 4.0000 mg | ORAL_TABLET | Freq: Four times a day (QID) | ORAL | Status: AC | PRN

## 2023-07-30 MED ORDER — HALOPERIDOL 0.5 MG PO TABS: 0.5000 mg | ORAL_TABLET | ORAL | Status: DC | PRN

## 2023-07-30 MED ORDER — MORPHINE SULFATE (PF) 2 MG/ML IV SOLN: 1.0000 mg | INTRAVENOUS | 0 refills | Status: AC | PRN

## 2023-07-30 MED ORDER — LORAZEPAM 1 MG PO TABS: 1.0000 mg | ORAL_TABLET | ORAL | Status: DC | PRN

## 2023-07-30 MED ORDER — HALOPERIDOL 0.5 MG PO TABS: 0.5000 mg | ORAL_TABLET | ORAL | 0 refills | Status: AC | PRN

## 2023-07-30 MED ORDER — POLYVINYL ALCOHOL 1.4 % OP SOLN: 1.0000 [drp] | Freq: Four times a day (QID) | OPHTHALMIC | Status: AC | PRN

## 2023-07-30 NOTE — Progress Notes (Signed)
Daily Progress Note Intern Pager: [phone]  Patient name: [name] Medical record number: [id number] Date of birth: 21-Apr-1934 Age: 89 y.o. Gender: male  Primary Care Provider: Nadara Eaton, MD Consultants: Palliative care Code Status: DNR/DNI -- comfort care.  Pt Overview and Major Events to Date:   [name] is an 89 year old male with a history of progressive dementia and acutely worsening p.o. intake complicated by 30 pound weight loss since January, history of complex partial seizure well-controlled on Keppra, right foot/ankle injury, SVT status post ablation who presented to [facility] ED after a fall in setting of likely dehydration and orthostasis.  Found to have no acute fractures/injury, however patient is profoundly cachectic and weak with hypernatremia to 153.  Patient admitted for fall workup, fluid repletion, and diet modification.  As of early p.m. 10/8, patient has been made DNR/DNI/goals of care.  Medications adjusted accordingly. Likely return to ALF with hospice today versus tomorrow.  Assessment & Plan Goals of care, counseling/discussion No PRNs required over the last 24 hours.  Continues to receive IV LR for volume resuscitation and IV Keppra for seizure prophylaxis.  Will return to ALF with home hospice, likely today.  This morning, son, [name], was in room.  He had just arrived.  Patient was resting comfortably.  Physical exam deferred in line with goals of care.  Per son, likely discharge to ALF with home hospice today versus tomorrow, depending on logistics.  Son has no new concerns at this time.  Reminded that he has access to primary team if he has any questions/needs. - Have discontinued all curative medications, SVT prophylaxis, labs, PT/OT therapy and begun the following via EOL order set: - PRN acetaminophen 650 mg PR vs p.o. every 6 hours as needed for mild pain. - PRN antiseptic oral rinse for dry mouth. - PRN Robinul 1 mg every 4 H for excessive  secretions.  Add on subcu and IV Robinul if needed. - PRN Haldol 0.5 mg every 4 hours - IV LR infusion 100 mL/h to maintain volume status. - IV Keppra for seizure prophylaxis - PRN Ativan PO versus IV every 4 hours for anxiety. - PRN IV morphine 1 to 4 mg for severe pain, air hunger. - PRN Zofran 4 mg sublingual vs IV for nausea - Liquifilm Tears 4 dry eyes  Chronic and Stable Issues:  Protein calorie malnutrition: On dysphagia 1 diet for comfort feeds.  Family can feed him whenever he prefers.  Made aware of risk of aspiration.  In line with goals of care. Fall: PT/OT discontinued.  Deferred further workup in setting of comfort care. Hyponatremia: Forwent D5W administration as resolution of hyponatremia will not significantly affect patient's comfort or disease course. Leukocytosis: Was resolving.  Labs discontinued.  FEN/GI: Dysphagia 1 diet, comfort feeds allowed, per family preference. PPx: Discontinue Lovenox in setting of comfort care recs. Dispo: Home with hospice at ALF  Subjective:   No PRNs needed overnight.  Interview deferred as patient was sleeping comfortably in bed.  No distress noted.  Objective:  BP: 90/60 HR: 79 RR: 17 T: 97.9 O2sat: 92% on room air  Significant vitals over past 24 hours: Not  Physical Exam:  General: Cachectic, chronically ill-appearing, lying in bed, NAD.  Cardiovascular: Deferred Respiratory: Deferred GI: Deferred Extremities: Deferred Neuro: Sleep  Basic labs:  Most recent CBC Lab Results  Component Value Date   WBC 12.3 (H) 07/28/2023   HGB 12.9 (L) 07/28/2023   HCT 43.8 07/28/2023  MCV 92.0 07/28/2023   PLT 237 07/28/2023   Most recent BMP    Latest Ref Rng & Units 07/29/2023    8:39 AM  BMP  Glucose 70 - 99 mg/dL 77   BUN 8 - 23 mg/dL 21   Creatinine 5.36 - 1.24 mg/dL 6.44   Sodium 034 - 742 mmol/L 154   Potassium 3.5 - 5.1 mmol/L 3.5   Chloride 98 - 111 mmol/L 114   CO2 22 - 32 mmol/L 24   Calcium 8.9 - 10.3  mg/dL 8.6     Other pertinent labs:  New labs deferred.  Imaging/Diagnostic Tests:  No new imaging.  Tomie China, MD 07/30/2023, 7:41 AM  PGY-1, [facility] Family Medicine FPTS Intern pager: [phone], text pages welcome Secure chat group [facility] Teaching Service

## 2023-07-30 NOTE — TOC Transition Note (Signed)
Transition of Care [facility]) - CM/SW Discharge Note   Patient Details  Name: [name] MRN: [id number] Date of Birth: 1934-04-11  Transition of Care [facility]) CM/SW Contact:  Carley Hammed, LCSW Phone Number: 07/30/2023, 11:16 AM   Clinical Narrative:    Pt to be transported to [facility] via [facility] once hospital bed has been set up.  Nurse to call report to [phone].   Final next level of care: Assisted Living Barriers to Discharge: Barriers Resolved   Patient Goals and CMS Choice CMS Medicare.gov Compare Post Acute Care list provided to:: Patient Represenative (must comment) Choice offered to / list presented to : Adult Children  Discharge Placement                Patient chooses bed at: [facility] Patient to be transferred to facility by: [facility] Name of family member notified: Rob and Doug Patient and family notified of of transfer: 07/30/23  Discharge Plan and Services Additional resources added to the After Visit Summary for       Post Acute Care Choice: Hospice                               Social Determinants of Health (SDOH) Interventions SDOH Screenings   Social Connections: Unknown (01/08/2020)   Received from [facility]  Tobacco Use: Low Risk  (07/27/2023)     Readmission Risk Interventions     No data to display

## 2023-07-30 NOTE — Discharge Summary (Addendum)
Family Medicine [facility] Discharge Summary  Patient name: [name] Medical record number: [id number] Date of birth: December 23, 1933 Age: 89 y.o. Gender: male Date of Admission: 07/27/2023  Date of Discharge: 07/30/2023 Admitting Physician: Caro Laroche, DO  Primary Care Provider: Nadara Eaton, MD Consultants: Palliative care medicine  Indication for Hospitalization: Hypernatremia in the setting of profound cachexia and dysphagia  Discharge Diagnoses/Problem List:  Principal Problem for Admission: Hyponatremia in the setting of chronic cachexia and dysphagia Other Problems addressed during stay:  Principal Problem:   Goals of care, counseling/discussion Active Problems:   Weakness   Protein-calorie malnutrition, severe   Unstageable sacral ulcer  Brief Hospital Course:  [name] is a 89 y.o. male with a past medical history of progressive dementia and poor p.o. intake living at ALF, who was admitted to [facility] Teaching Service at [facility] for fall. Hospital course is outlined below by problem:  Goals of care Palliative care consulted 10/7.  On family meeting 10/8 with medical and palliative both present, patient's prognosis was related to his sons.  Discussed treatment options, and emphasized goal of reducing suffering and maintaining patient's dignity at the end of his life.  Relayed that continuing medical intervention at this time would not meaningfully alter/extend patient's remaining time without incurring unnecessary suffering.  IV LR and IV Keppra maintained to maintain fluid status and prevent seizure, respectively. After a thorough discussion of all possible options, family agreed to transition patient from full code to DNR/DNI/comfort care 10/18.  Discharged back to his ALF on 10/9 with home hospice care.  Hospice will take over patient's medication management at this time.  Severe protein calorie malnutrition Patient has lost 30 pounds  since January.  Put on mirtazapine and Ensure 3 times daily at ALF.  SLP consulted, made n.p.o. on admission 10/6.  Trial of thin liquids -- immediate coughing/wet vocal quality.  Required multiple swallows to clear pures.  Initial SLP eval: PO liquids provoked an immediate coughing and wet vocal quality, apparently patient's baseline.  Barium swallow showed severe dysphagia.  Does not protect airway fully.  Very limited options for therapy given advanced dementia.  After being informed of likely futility, and significant risk for aspiration, family opted for comfort feeds 10/7, dysphagia 1 diet.  Palliative care consulted, initial conversation with family.  Per family, patient once voiced that he would "like everything done".  After several conversations, family decided on DNR/DNI/comfort care 10/8.  Continued IV LR for volume repletion, otherwise discontinued curative medications.    Fall Presented after being found down in the bathroom of his nursing home.  Intermittently able to answer orientation questions at that time.  On arrival to ED he was HDS and conversant.  Sodium 153, potassium 3.4, creatinine 0.85.  Hemoglobin stable from prior 12.5, white blood cell count 14.5. CT head/C-spine negative for acute finding.  Chest/hip/right knee x-rays without acute abnormality.  IV fluids initiated as below for dehydration and likely orthostasis.  EEG negative.  CK negative.  Patient refused physical therapy, wishing instead to sleep.  PT/OT orders discontinued after patient made comfort care 10/8.  Hypernatremia Na: 153 on admit.  It appears that patient has difficulty adequately repleting fluids PO.  Worsened within a relatively short period of time.  Began on continuous IV LR.  Sodium remained 153-154.  Patient was made comfort care 10/8. IV D5W considered to reduce hypernatremia, however would have required regular blood draws to track sodium, which was not  within patient's goals of care.  Instead chose IV  LR to maintain fluid status, without correcting hypernatremia.  Prolonged QT interval QTc 577 on initial read, corrected to 419.  DC'd midodrine 10/6 for QTc prolonging effects. BPs soft, but stable.  Other conditions that were chronic and stable:  - SVT, s/p ablation: NSR on EKG  - Complex partial seizure on long-term Keppra: Switched to IV Keppra ISO dysphagia.  Continued on IV Keppra after comfort care measures enacted to avoid seizure. +/- continue IV Keppra on discharge to prevent seizure.  Issues for follow up: Appropriate end-of-life PRN regimen at ALF with home health. Keppra continued to help prevent seizures while on hospice,     Disposition: Return to ALF with home hospice.   Discharge Condition: End-of-life.  Discharge Exam:  Vitals:   07/29/23 2110 07/30/23 0739  BP: 104/62 90/60  Pulse: 87 79  Resp: 17   Temp: 98.4 F (36.9 C) 97.9 F (36.6 C)  SpO2: 94% 92%   Patient resting comfortably. Exam deferred in setting of patient's current goals of care.    Significant Procedures: None  Significant Labs and Imaging:  Recent Labs  Lab 07/28/23 2118  WBC 12.3*  HGB 12.9*  HCT 43.8  PLT 237   Recent Labs  Lab 07/28/23 2118 07/29/23 0126 07/29/23 0839  NA 154* 153* 154*  K 4.0 4.1 3.5  CL 116* 118* 114*  CO2 26 23 24   GLUCOSE 88 86 77  BUN 24* 23 21  CREATININE 0.85 0.77 0.69  CALCIUM 8.9 8.5* 8.6*  MG 2.4  --   --     Pertinent Imaging: None   Results/Tests Pending at Time of Discharge: None  Discharge Medications:  Allergies as of 07/30/2023       Reactions   Dilantin [phenytoin Sodium Extended] Other (See Comments)   Reaction not recalled by the patient (??)   Lamotrigine Other (See Comments)   Drowsiness Drowsiness Drowsiness Drowsiness Drowsiness   Phenytoin Sodium Extended Other (See Comments)   Reaction not recalled by the patient (??)   Sulfa Antibiotics Other (See Comments)   From childhood; reaction not recalled    Sulfamethoxazole Other (See Comments)   Other Other   Sulfonamide Derivatives Other (See Comments)   From childhood; reaction not recalled        Medication List     STOP taking these medications    ACIDOPHILUS/PECTIN PO   aspirin EC 81 MG tablet   cromolyn 5.2 MG/ACT nasal spray Commonly known as: NASALCROM   docusate sodium 100 MG capsule Commonly known as: COLACE   donepezil 10 MG tablet Commonly known as: ARICEPT   finasteride 5 MG tablet Commonly known as: PROSCAR   levETIRAcetam 750 MG 24 hr tablet Commonly known as: KEPPRA XR   levothyroxine 75 MCG tablet Commonly known as: SYNTHROID   memantine 10 MG tablet Commonly known as: NAMENDA   midodrine 2.5 MG tablet Commonly known as: PROAMATINE   mirtazapine 30 MG tablet Commonly known as: REMERON   polyethylene glycol powder 17 GM/SCOOP powder Commonly known as: GLYCOLAX/MIRALAX   pravastatin 20 MG tablet Commonly known as: PRAVACHOL       TAKE these medications    acetaminophen 650 MG CR tablet Commonly known as: TYLENOL as needed.   antiseptic oral rinse Liqd Apply 15 mLs topically as needed for dry mouth.   glycopyrrolate 1 MG tablet Commonly known as: ROBINUL Take 1 tablet (1 mg total) by mouth every 4 (four) hours as  needed (excessive secretions).   haloperidol 0.5 MG tablet Commonly known as: HALDOL Take 1 tablet (0.5 mg total) by mouth every 4 (four) hours as needed for agitation (or delirium).   HYDROcodone-acetaminophen 5-325 MG tablet Commonly known as: NORCO/VICODIN Take 1 tablet by mouth every 6 (six) hours as needed for severe pain.   LORazepam 1 MG tablet Commonly known as: ATIVAN Take 1 tablet (1 mg total) by mouth every 4 (four) hours as needed for anxiety.   morphine (PF) 2 MG/ML injection Inject 0.5-2 mLs (1-4 mg total) into the vein every 15 (fifteen) minutes as needed (To alleviate signs and symptoms of distress).   ondansetron 4 MG disintegrating  tablet Commonly known as: ZOFRAN-ODT Take 1 tablet (4 mg total) by mouth every 6 (six) hours as needed for nausea.   polyvinyl alcohol 1.4 % ophthalmic solution Commonly known as: LIQUIFILM TEARS Place 1 drop into both eyes 4 (four) times daily as needed for dry eyes.        Discharge Instructions: Please refer to Patient Instructions section of EMR for full details.  Patient was counseled important signs and symptoms that should prompt return to medical care, changes in medications, dietary instructions, activity restrictions, and follow up appointments.   Follow-Up Appointments:  None  Tomie China, MD 07/30/2023, 10:48 AM PGY-1, [facility] Family [facility]  I agree with the assessment and plan as documented in the resident's note.  Darral Dash, DO PGY-3 [facility]

## 2023-07-30 NOTE — [place] FL2 (Signed)
[place] MEDICAID FL2 LEVEL OF CARE FORM     IDENTIFICATION  Patient Name: [name] Birthdate: Feb 28, 1934 Sex: male Admission Date (Current Location): 07/27/2023  [facility] and [place] Number:  Producer, television/film/video and Address:  [facility], [address] [place], [place] [address]      Provider Number: [id number]  Attending Physician Name and Address:  Caro Laroche, DO  Relative Name and Phone Number:  Montrez Marietta  [phone]    Current Level of Care: Hospital Recommended Level of Care: Assisted Living Facility Prior Approval Number:    Date Approved/Denied:   PASRR Number:    Discharge Plan: Other (Comment) ([facility])    Current Diagnoses: Patient Active Problem List   Diagnosis Date Noted   Protein-calorie malnutrition, severe 07/29/2023   Goals of care, counseling/discussion 07/29/2023   Weakness 07/28/2023   Acquired hypothyroidism 04/11/2018   Elevated troponin 10/14/2017   Mixed hyperlipidemia 10/14/2017   Other chest pain 10/14/2017   SVT (supraventricular tachycardia) (HCC) 10/13/2017   Impacted cerumen of both ears 06/25/2017   Sensorineural hearing loss (SNHL), bilateral 06/25/2017   Pain in right ankle and joints of right foot 03/03/2017   Acute left-sided low back pain without sciatica 03/03/2017   Post-traumatic osteoarthritis, right ankle and foot 03/03/2017   Dementia (HCC) 04/07/2014   Hx of colonic polyps 04/07/2014   Hyperlipemia 04/07/2014   Chronic obstructive asthma (HCC) 04/07/2014   Esophagus, Barrett's 04/07/2014   ACUTE BRONCHITIS 09/05/2009   ALLERGIC RHINITIS 09/26/2007   Asthma with bronchitis 09/26/2007   ESOPHAGEAL REFLUX 09/26/2007   Seizures (HCC) 09/26/2007    Orientation RESPIRATION BLADDER Height & Weight     Self  Normal Incontinent Weight: 113 lb 12.1 oz (51.6 kg) (bed scale) Height:  5\' 9"  (175.3 cm)  BEHAVIORAL SYMPTOMS/MOOD NEUROLOGICAL BOWEL NUTRITION STATUS       Incontinent Diet (Dysphagia 1 diet for comfort feeds)  AMBULATORY STATUS COMMUNICATION OF NEEDS Skin   Extensive Assist Verbally PU Stage and Appropriate Care (Pressure Injury upper Coccyx, Unstageable)                       Personal Care Assistance Level of Assistance  Bathing, Feeding, Dressing Bathing Assistance: Maximum assistance Feeding assistance: Maximum assistance Dressing Assistance: Maximum assistance     Functional Limitations Info  Sight, Hearing, Speech Sight Info: Adequate Hearing Info: Impaired Speech Info: Adequate    SPECIAL CARE FACTORS FREQUENCY                       Contractures Contractures Info: Not present    Additional Factors Info  Code Status, Allergies Code Status Info: DNR Allergies Info: Dilantin (Phenytoin Sodium Extended)  Lamotrigine  Phenytoin Sodium Extended  Sulfa Antibiotics  Sulfamethoxazole  Sulfonamide Derivatives           Current Medications (07/30/2023):  This is the current hospital active medication list Current Facility-Administered Medications  Medication Dose Route Frequency Provider Last Rate Last Admin   acetaminophen (TYLENOL) tablet 650 mg  650 mg Oral Q6H PRN Anice Paganini, NP       Or   acetaminophen (TYLENOL) suppository 650 mg  650 mg Rectal Q6H PRN Anice Paganini, NP       antiseptic oral rinse (BIOTENE) solution 15 mL  15 mL Topical PRN Gill, Eric A, NP       glycopyrrolate (ROBINUL) tablet 1 mg  1 mg Oral  Q4H PRN Anice Paganini, NP       Or   glycopyrrolate (ROBINUL) injection 0.2 mg  0.2 mg Subcutaneous Q4H PRN Anice Paganini, NP       Or   glycopyrrolate (ROBINUL) injection 0.2 mg  0.2 mg Intravenous Q4H PRN Anice Paganini, NP       haloperidol (HALDOL) tablet 0.5 mg  0.5 mg Oral Q4H PRN Anice Paganini, NP       Or   haloperidol (HALDOL) 2 MG/ML solution 0.5 mg  0.5 mg Sublingual Q4H PRN Anice Paganini, NP       Or   haloperidol lactate (HALDOL) injection 0.5 mg  0.5 mg Intravenous Q4H PRN Anice Paganini, NP        lactated ringers infusion   Intravenous Continuous Evette Georges, MD 75 mL/hr at 07/30/23 0509 New Bag at 07/30/23 0509   levETIRAcetam (KEPPRA) 250 mg in sodium chloride 0.9 % 100 mL IVPB  250 mg Intravenous Daily Hammons, Gerhard Munch, RPH   Stopped at 07/29/23 1140   And   levETIRAcetam (KEPPRA) IVPB 500 mg/100 mL premix  500 mg Intravenous QHS Hammons, Kimberly B, RPH   Stopped at 07/29/23 2128   LORazepam (ATIVAN) tablet 1 mg  1 mg Oral Q4H PRN Anice Paganini, NP       Or   LORazepam (ATIVAN) injection 1 mg  1 mg Intravenous Q4H PRN Wynne Dust A, NP       morphine (PF) 2 MG/ML injection 1-4 mg  1-4 mg Intravenous Q15 min PRN Anice Paganini, NP       ondansetron (ZOFRAN-ODT) disintegrating tablet 4 mg  4 mg Oral Q6H PRN Anice Paganini, NP       Or   ondansetron (ZOFRAN) injection 4 mg  4 mg Intravenous Q6H PRN Gill, Eric A, NP       polyvinyl alcohol (LIQUIFILM TEARS) 1.4 % ophthalmic solution 1 drop  1 drop Both Eyes QID PRN Anice Paganini, NP         Discharge Medications:  acetaminophen 650 MG CR tablet Commonly known as: TYLENOL as needed.    antiseptic oral rinse Liqd Apply 15 mLs topically as needed for dry mouth.    glycopyrrolate 1 MG tablet Commonly known as: ROBINUL Take 1 tablet (1 mg total) by mouth every 4 (four) hours as needed (excessive secretions).    haloperidol 0.5 MG tablet Commonly known as: HALDOL Take 1 tablet (0.5 mg total) by mouth every 4 (four) hours as needed for agitation (or delirium).    HYDROcodone-acetaminophen 5-325 MG tablet Commonly known as: NORCO/VICODIN Take 1 tablet by mouth every 6 (six) hours as needed for severe pain.    LORazepam 1 MG tablet Commonly known as: ATIVAN Take 1 tablet (1 mg total) by mouth every 4 (four) hours as needed for anxiety.    morphine (PF) 2 MG/ML injection Inject 0.5-2 mLs (1-4 mg total) into the vein every 15 (fifteen) minutes as needed (To alleviate signs and symptoms of distress).    ondansetron 4 MG  disintegrating tablet Commonly known as: ZOFRAN-ODT Take 1 tablet (4 mg total) by mouth every 6 (six) hours as needed for nausea.    polyvinyl alcohol 1.4 % ophthalmic solution Commonly known as: LIQUIFILM TEARS Place 1 drop into both eyes 4 (four) times daily as needed for dry eyes.     Relevant Imaging Results:  Relevant Lab Results:   Additional Information SS# [phone]  Carley Hammed, LCSW

## 2023-07-30 NOTE — Assessment & Plan Note (Addendum)
No PRNs required over the last 24 hours.  Continues to receive IV LR for volume resuscitation and IV Keppra for seizure prophylaxis.  Will return to ALF with home hospice, likely today.  This morning, son, [name], was in room.  He had just arrived.  Patient was resting comfortably.  Physical exam deferred in line with goals of care.  Per son, likely discharge to ALF with home hospice today versus tomorrow, depending on logistics.  Son has no new concerns at this time.  Reminded that he has access to primary team if he has any questions/needs. - Have discontinued all curative medications, SVT prophylaxis, labs, PT/OT therapy and begun the following via EOL order set: - PRN acetaminophen 650 mg PR vs p.o. every 6 hours as needed for mild pain. - PRN antiseptic oral rinse for dry mouth. - PRN Robinul 1 mg every 4 H for excessive secretions.  Add on subcu and IV Robinul if needed. - PRN Haldol 0.5 mg every 4 hours - IV LR infusion 100 mL/h to maintain volume status. - IV Keppra for seizure prophylaxis - PRN Ativan PO versus IV every 4 hours for anxiety. - PRN IV morphine 1 to 4 mg for severe pain, air hunger. - PRN Zofran 4 mg sublingual vs IV for nausea - Liquifilm Tears 4 dry eyes

## 2023-07-30 NOTE — Plan of Care (Signed)
  Problem: Clinical Measurements: Goal: Will remain free from infection Outcome: Progressing Goal: Cardiovascular complication will be avoided Outcome: Progressing   Problem: Activity: Goal: Risk for activity intolerance will decrease Outcome: Not Progressing   Problem: Nutrition: Goal: Adequate nutrition will be maintained Outcome: Not Progressing

## 2023-07-30 NOTE — Plan of Care (Signed)
  Problem: Education: Goal: Knowledge of General Education information will improve Description: Including pain rating scale, medication(s)/side effects and non-pharmacologic comfort measures Outcome: Adequate for Discharge   Problem: Health Behavior/Discharge Planning: Goal: Ability to manage health-related needs will improve Outcome: Adequate for Discharge   Problem: Clinical Measurements: Goal: Ability to maintain clinical measurements within normal limits will improve Outcome: Adequate for Discharge Goal: Will remain free from infection Outcome: Adequate for Discharge Goal: Diagnostic test results will improve Outcome: Adequate for Discharge Goal: Respiratory complications will improve Outcome: Adequate for Discharge Goal: Cardiovascular complication will be avoided Outcome: Adequate for Discharge   Problem: Activity: Goal: Risk for activity intolerance will decrease Outcome: Adequate for Discharge   Problem: Nutrition: Goal: Adequate nutrition will be maintained Outcome: Adequate for Discharge   Problem: Coping: Goal: Level of anxiety will decrease Outcome: Adequate for Discharge   Problem: Elimination: Goal: Will not experience complications related to bowel motility Outcome: Adequate for Discharge Goal: Will not experience complications related to urinary retention Outcome: Adequate for Discharge   Problem: Pain Managment: Goal: General experience of comfort will improve Outcome: Adequate for Discharge   Problem: Safety: Goal: Ability to remain free from injury will improve Outcome: Adequate for Discharge   Problem: Skin Integrity: Goal: Risk for impaired skin integrity will decrease Outcome: Adequate for Discharge   Problem: Education: Goal: Knowledge of the prescribed therapeutic regimen will improve Outcome: Adequate for Discharge   Problem: Coping: Goal: Ability to identify and develop effective coping behavior will improve Outcome: Adequate for  Discharge   Problem: Clinical Measurements: Goal: Quality of life will improve Outcome: Adequate for Discharge   Problem: Respiratory: Goal: Verbalizations of increased ease of respirations will increase Outcome: Adequate for Discharge   Problem: Role Relationship: Goal: Family's ability to cope with current situation will improve Outcome: Adequate for Discharge Goal: Ability to verbalize concerns, feelings, and thoughts to partner or family member will improve Outcome: Adequate for Discharge   Problem: Pain Management: Goal: Satisfaction with pain management regimen will improve Outcome: Adequate for Discharge     Kelli Hope, RN

## 2023-08-22 DEATH — deceased
